# Patient Record
Sex: Male | Born: 1935 | Race: White | Hispanic: No | Marital: Married | Smoking: Former smoker
Health system: Southern US, Community
[De-identification: ages and names within clinical notes are randomized; demographics above are authoritative.]

## PROBLEM LIST (undated history)

## (undated) DIAGNOSIS — N184 Chronic kidney disease, stage 4 (severe): Secondary | ICD-10-CM

## (undated) DIAGNOSIS — Z860101 Personal history of adenomatous and serrated colon polyps: Secondary | ICD-10-CM

## (undated) DIAGNOSIS — Z86010 Personal history of colonic polyps: Secondary | ICD-10-CM

## (undated) DIAGNOSIS — Z9889 Other specified postprocedural states: Secondary | ICD-10-CM

## (undated) DIAGNOSIS — M549 Dorsalgia, unspecified: Secondary | ICD-10-CM

## (undated) DIAGNOSIS — N400 Enlarged prostate without lower urinary tract symptoms: Secondary | ICD-10-CM

## (undated) DIAGNOSIS — R0602 Shortness of breath: Secondary | ICD-10-CM

## (undated) DIAGNOSIS — J449 Chronic obstructive pulmonary disease, unspecified: Secondary | ICD-10-CM

## (undated) DIAGNOSIS — A0472 Enterocolitis due to Clostridium difficile, not specified as recurrent: Secondary | ICD-10-CM

## (undated) DIAGNOSIS — C3490 Malignant neoplasm of unspecified part of unspecified bronchus or lung: Secondary | ICD-10-CM

## (undated) HISTORY — PX: CHOLECYSTECTOMY: SHX55

## (undated) HISTORY — PX: APPENDECTOMY: SHX54

## (undated) HISTORY — PX: EYE SURGERY: SHX253

## (undated) HISTORY — DX: Malignant neoplasm of unspecified part of unspecified bronchus or lung: C34.90

## (undated) HISTORY — DX: Other specified postprocedural states: Z98.890

## (undated) HISTORY — PX: OTHER SURGICAL HISTORY: SHX169

## (undated) HISTORY — DX: Chronic kidney disease, stage 4 (severe): N18.4

## (undated) HISTORY — DX: Dorsalgia, unspecified: M54.9

## (undated) HISTORY — DX: Chronic obstructive pulmonary disease, unspecified: J44.9

## (undated) HISTORY — DX: Benign prostatic hyperplasia without lower urinary tract symptoms: N40.0

## (undated) HISTORY — DX: Personal history of adenomatous and serrated colon polyps: Z86.0101

## (undated) HISTORY — DX: Personal history of colonic polyps: Z86.010

---

## 1998-10-04 ENCOUNTER — Other Ambulatory Visit: Admission: RE | Admit: 1998-10-04 | Discharge: 1998-10-04 | Payer: Self-pay | Admitting: Urology

## 2002-07-27 ENCOUNTER — Emergency Department (HOSPITAL_COMMUNITY): Admission: EM | Admit: 2002-07-27 | Discharge: 2002-07-27 | Payer: Self-pay | Admitting: Emergency Medicine

## 2002-09-20 ENCOUNTER — Encounter: Payer: Self-pay | Admitting: Neurosurgery

## 2002-09-20 ENCOUNTER — Ambulatory Visit (HOSPITAL_COMMUNITY): Admission: RE | Admit: 2002-09-20 | Discharge: 2002-09-20 | Payer: Self-pay | Admitting: Neurosurgery

## 2002-10-05 ENCOUNTER — Encounter (HOSPITAL_COMMUNITY): Admission: RE | Admit: 2002-10-05 | Discharge: 2002-11-04 | Payer: Self-pay | Admitting: Neurosurgery

## 2003-02-01 ENCOUNTER — Encounter: Payer: Self-pay | Admitting: *Deleted

## 2003-02-01 ENCOUNTER — Emergency Department (HOSPITAL_COMMUNITY): Admission: EM | Admit: 2003-02-01 | Discharge: 2003-02-02 | Payer: Self-pay | Admitting: Emergency Medicine

## 2004-10-20 ENCOUNTER — Ambulatory Visit (HOSPITAL_COMMUNITY): Admission: RE | Admit: 2004-10-20 | Discharge: 2004-10-20 | Payer: Self-pay | Admitting: Neurosurgery

## 2005-11-30 ENCOUNTER — Ambulatory Visit: Payer: Self-pay | Admitting: Internal Medicine

## 2005-11-30 ENCOUNTER — Ambulatory Visit (HOSPITAL_COMMUNITY): Admission: RE | Admit: 2005-11-30 | Discharge: 2005-11-30 | Payer: Self-pay | Admitting: Internal Medicine

## 2005-12-15 ENCOUNTER — Ambulatory Visit (HOSPITAL_COMMUNITY): Admission: RE | Admit: 2005-12-15 | Discharge: 2005-12-15 | Payer: Self-pay | Admitting: Pulmonary Disease

## 2005-12-24 ENCOUNTER — Ambulatory Visit (HOSPITAL_COMMUNITY): Admission: RE | Admit: 2005-12-24 | Discharge: 2005-12-24 | Payer: Self-pay | Admitting: Pulmonary Disease

## 2006-01-01 ENCOUNTER — Ambulatory Visit (HOSPITAL_COMMUNITY): Admission: RE | Admit: 2006-01-01 | Discharge: 2006-01-01 | Payer: Self-pay | Admitting: Pulmonary Disease

## 2006-03-03 ENCOUNTER — Ambulatory Visit: Admission: RE | Admit: 2006-03-03 | Discharge: 2006-05-14 | Payer: Self-pay | Admitting: Radiation Oncology

## 2006-03-16 LAB — CBC WITH DIFFERENTIAL/PLATELET
EOS%: 2.9 % (ref 0.0–7.0)
Eosinophils Absolute: 0.2 10*3/uL (ref 0.0–0.5)
HCT: 39.2 % (ref 38.7–49.9)
MCH: 29.5 pg (ref 28.0–33.4)
MCV: 89.4 fL (ref 81.6–98.0)
MONO%: 5.9 % (ref 0.0–13.0)
NEUT#: 5.4 10*3/uL (ref 1.5–6.5)
NEUT%: 74.1 % (ref 40.0–75.0)
RBC: 4.39 10*6/uL (ref 4.20–5.71)
WBC: 7.3 10*3/uL (ref 4.0–10.0)

## 2006-04-20 LAB — CBC WITH DIFFERENTIAL/PLATELET
BASO%: 0.6 % (ref 0.0–2.0)
EOS%: 5 % (ref 0.0–7.0)
Eosinophils Absolute: 0.3 10*3/uL (ref 0.0–0.5)
HCT: 39.9 % (ref 38.7–49.9)
MCV: 90.7 fL (ref 81.6–98.0)
MONO#: 0.5 10*3/uL (ref 0.1–0.9)
MONO%: 7.4 % (ref 0.0–13.0)
NEUT%: 76.7 % — ABNORMAL HIGH (ref 40.0–75.0)
RBC: 4.4 10*6/uL (ref 4.20–5.71)
RDW: 18.4 % — ABNORMAL HIGH (ref 11.2–14.6)

## 2007-04-08 ENCOUNTER — Emergency Department (HOSPITAL_COMMUNITY): Admission: EM | Admit: 2007-04-08 | Discharge: 2007-04-08 | Payer: Self-pay | Admitting: Advanced Practice Midwife

## 2009-02-20 ENCOUNTER — Ambulatory Visit (HOSPITAL_COMMUNITY): Admission: RE | Admit: 2009-02-20 | Discharge: 2009-02-20 | Payer: Self-pay | Admitting: Pulmonary Disease

## 2009-04-18 ENCOUNTER — Ambulatory Visit (HOSPITAL_COMMUNITY): Admission: RE | Admit: 2009-04-18 | Discharge: 2009-04-18 | Payer: Self-pay | Admitting: Ophthalmology

## 2009-05-09 ENCOUNTER — Ambulatory Visit (HOSPITAL_COMMUNITY): Admission: RE | Admit: 2009-05-09 | Discharge: 2009-05-09 | Payer: Self-pay | Admitting: Unknown Physician Specialty

## 2010-12-28 LAB — BASIC METABOLIC PANEL
CO2: 28 mEq/L (ref 19–32)
Chloride: 110 mEq/L (ref 96–112)
Creatinine, Ser: 1.89 mg/dL — ABNORMAL HIGH (ref 0.4–1.5)
Sodium: 144 mEq/L (ref 135–145)

## 2010-12-28 LAB — HEMOGLOBIN AND HEMATOCRIT, BLOOD
HCT: 38.7 % — ABNORMAL LOW (ref 39.0–52.0)
Hemoglobin: 13.3 g/dL (ref 13.0–17.0)

## 2011-02-06 NOTE — Op Note (Signed)
NAME:  Paul Gray, Paul Gray                  ACCOUNT NO.:  [id number]   MEDICAL RECORD NO.:  [id number]          PATIENT TYPE:  AMB   LOCATION:  DAY                           FACILITY:  [facility]   PHYSICIAN:  R. Roetta Sessions, M.D. DATE OF BIRTH:  04-08-1923   DATE OF PROCEDURE:  11/30/2005  DATE OF DISCHARGE:                                 OPERATIVE REPORT   PROCEDURE:  Surveillance colonoscopy.   ENDOSCOPIST:  Gerrit Friends. Rourk, M.D.   INDICATIONS FOR PROCEDURE:  The patient is a 77 year old gentleman with a  history of colonic adenomatous polyps.  His last colonoscopy was in 2002.  He had only hyperplastic problems at that time.  He is having no lower GI  tract symptoms. Colonoscopy is now being done as surveillance maneuver. This  approach has been discussed with the patient at length.  The potential  risks, benefits, and alternatives have been reviewed; questions answered.  She is agreeable.  Please see the documentation in the medical record.   PROCEDURE NOTE:  O2 saturation, blood pressure, pulse and respirations were  monitored throughout the entire procedure.   CONSCIOUS SEDATION:  Versed 2 mg IV, Demerol 50 mg IV.   INSTRUMENT:  Olympus video chip system.   FINDINGS:  A digital rectal exam revealed no abnormalities.   ENDOSCOPIC FINDINGS:  The prep was good.   RECTUM:  Examination of the rectal mucosa including a retroflex view of the  anal verge revealed no abnormalities aside from internal hemorrhoids.   COLON:  The colonic mucosa was surveyed from the rectosigmoid junction  through the left transverse, right colon, to the area of the appendiceal  orifice, ileocecal valve, and cecum.  These structures were well seen and  photographed for the record.   From this level the scope was slowly withdrawn.  All previously mentioned  mucosal surfaces were again seen.  The patient was noted to have pancolonic  diverticula.  The remainder of the colonic mucosa appeared to be normal.  The patient tolerated the procedure well and was reacted in endoscopy.   IMPRESSION:  1.  Internal hemorrhoids, otherwise normal rectum.  2.  Pancolonic diverticulum.  3.  The remainder of the colonic mucosa appeared normal.   RECOMMENDATIONS:  1.  Diverticulosis literature provided to [name].  2.  If he remains in good health, would consider one more colonoscopy in 5      years.      [name], M.D.  Electronically Signed     RMR/MEDQ  D:  11/30/2005  T:  11/30/2005  Job:  [id number]   cc:   Ramon Dredge L. Juanetta Gosling, M.D.  Fax: [phone]

## 2011-02-19 ENCOUNTER — Encounter: Payer: Self-pay | Admitting: Gastroenterology

## 2011-02-19 ENCOUNTER — Ambulatory Visit (INDEPENDENT_AMBULATORY_CARE_PROVIDER_SITE_OTHER): Payer: Medicare Other | Admitting: Gastroenterology

## 2011-02-19 VITALS — BP 126/61 | HR 73 | Temp 97.2°F | Ht 69.0 in | Wt 176.6 lb

## 2011-02-19 DIAGNOSIS — Z860101 Personal history of adenomatous and serrated colon polyps: Secondary | ICD-10-CM

## 2011-02-19 DIAGNOSIS — Z86010 Personal history of colonic polyps: Secondary | ICD-10-CM

## 2011-02-19 NOTE — Patient Instructions (Signed)
We have set you up for a colonoscopy with Dr. Jena Gauss.  Further recommendations to follow.  No changes to your medications at this time.

## 2011-02-19 NOTE — Progress Notes (Signed)
Primary Care Physician:  Fredirick Maudlin, MD Primary Gastroenterologist:  Dr. Jena Gauss  Chief Complaint  Patient presents with  . Colon Cancer Screening    HPI:  Paul Gray is a 77 y.o. male here as a new patient for surveillance colonoscopy. Last colonoscopy in 2007 with internal hemorrhoids, pancolonic diverticula. He does have a hx of adenomatous polyps. According to the last colonoscopy in 2007, this may be the last surveillance for colon cancer needed. He is doing well at this time. No abdominal pain. No N/V. BM about every day. No blood in stool. No lack of appetite or wt loss. He has no concerns or questions at this time. Wife present.   Past Medical History  Diagnosis Date  . Lung cancer   . Back pain   . BPH (benign prostatic hyperplasia)   . S/P colonoscopy 2002, 2007    hyperplastic polyps 2002; pancolonic diverticula 2007  . Hx of adenomatous colonic polyps   . COPD (chronic obstructive pulmonary disease)     Past Surgical History  Procedure Date  . Hernia surgery x 3     inguinal hernia repair  . Cholecystectomy   . Appendectomy   . Radiofrequency ablation for lung cancer     Current Outpatient Prescriptions  Medication Sig Dispense Refill  . Fluticasone-Salmeterol (ADVAIR DISKUS) 250-50 MCG/DOSE AEPB Inhale 1 puff into the lungs every 12 (twelve) hours.        Marland Kitchen HYDROcodone-acetaminophen (VICODIN) 5-500 MG per tablet Take 1 tablet by mouth every 6 (six) hours as needed.        . Tamsulosin HCl (FLOMAX) 0.4 MG CAPS Take by mouth.        . tiotropium (SPIRIVA) 18 MCG inhalation capsule Place 18 mcg into inhaler and inhale daily.          Allergies as of 02/19/2011 - Review Complete 02/19/2011  Allergen Reaction Noted  . Penicillins  02/19/2011    No family hx of colorectal ca, digestive problems, liver problems.   History   Social History  . Marital Status: Married    Spouse Name: N/A    Number of Children: N/A  . Years of Education: N/A    Occupational History  . retired     cigarette factory   Social History Main Topics  . Smoking status: Former Games developer  . Smokeless tobacco: Not on file   Comment: quit 6 years ago, about 1 ppd X 60+ years  . Alcohol Use: No  . Drug Use: No   Review of Systems: Gen: Denies any fever, chills, sweats, anorexia, fatigue, weakness, malaise, weight loss, and sleep disorder CV: Denies chest pain, angina, palpitations, syncope, orthopnea, PND, peripheral edema, and claudication. Resp: Denies dyspnea at rest, dyspnea with exercise, cough, sputum, wheezing, coughing up blood, and pleurisy. GI: Denies vomiting blood, jaundice, and fecal incontinence.   Denies dysphagia or odynophagia. GU : Denies urinary burning, blood in urine, urinary frequency, urinary hesitancy, nocturnal urination, and urinary incontinence. MS: Denies joint pain, limitation of movement, and swelling, stiffness, low back pain, extremity pain. Denies muscle weakness, cramps, atrophy.  Derm: Denies rash, itching, dry skin, hives, moles, warts, or unhealing ulcers.  Psych: Denies depression, anxiety, memory loss, suicidal ideation, hallucinations, paranoia, and confusion. Heme: Denies bruising, bleeding, and enlarged lymph nodes.  Physical Exam: BP 126/61  Pulse 73  Temp(Src) 97.2 F (36.2 C) (Temporal)  Ht 5\' 9"  (1.753 m)  Wt 176 lb 9.6 oz (80.105 kg)  BMI 26.08 kg/m2 General:  Alert,  Well-developed, well-nourished, pleasant and cooperative in NAD Head:  Normocephalic and atraumatic. Eyes:  Sclera clear, no icterus.   Conjunctiva pink. Ears:  Normal auditory acuity. Nose:  No deformity, discharge,  or lesions. Mouth:  No deformity or lesions, dentition normal. Neck:  Supple; no masses or thyromegaly. Lungs:  Clear throughout to auscultation.   No wheezes, crackles, or rhonchi. No acute distress. Heart:  Regular rate and rhythm; no murmurs, clicks, rubs,  or gallops. Abdomen:  Soft, nontender and nondistended. No  masses, hepatosplenomegaly or hernias noted. Normal bowel sounds, without guarding, and without rebound.   Rectal:  Deferred until time of colonoscopy.   Msk:  Symmetrical without gross deformities. Normal posture. Pulses:  Normal pulses noted. Extremities:  Without clubbing or edema. Neurologic:  Alert and  oriented x4;  grossly normal neurologically. Skin:  Intact without significant lesions or rashes. Cervical Nodes:  No significant cervical adenopathy. Psych:  Alert and cooperative. Normal mood and affect.

## 2011-02-22 ENCOUNTER — Encounter: Payer: Self-pay | Admitting: Gastroenterology

## 2011-02-22 DIAGNOSIS — Z86010 Personal history of colonic polyps: Secondary | ICD-10-CM | POA: Insufficient documentation

## 2011-02-22 NOTE — Assessment & Plan Note (Signed)
Paul Gray is an 77 year old Caucasian male with a hx of adenomatous polyps in remote past, now presenting for updated surveillance colonoscopy. Last colonoscopy in 2007 with internal hemorrhoids and pancolonic diverticula. He is doing well without any rectal bleeding, abdominal pain, N/V, or wt loss. We will proceed with a surveillance colonoscopy due to his hx of adenomatous polyps.  Proceed with TCS with Dr. Jena Gauss in near future: the risks, benefits, and alternatives have been discussed with the patient in detail. The patient states understanding and desires to proceed.

## 2011-02-23 NOTE — Progress Notes (Signed)
Cc to PCP 

## 2011-03-04 ENCOUNTER — Ambulatory Visit (HOSPITAL_COMMUNITY)
Admission: RE | Admit: 2011-03-04 | Discharge: 2011-03-04 | Disposition: A | Discharge status: Home / Self Care | Payer: Medicare Other | Source: Ambulatory Visit | Attending: Internal Medicine | Admitting: Internal Medicine

## 2011-03-04 ENCOUNTER — Encounter: Payer: Medicare Other | Admitting: Internal Medicine

## 2011-03-04 DIAGNOSIS — K5730 Diverticulosis of large intestine without perforation or abscess without bleeding: Secondary | ICD-10-CM

## 2011-03-04 DIAGNOSIS — Z1211 Encounter for screening for malignant neoplasm of colon: Secondary | ICD-10-CM

## 2011-03-04 DIAGNOSIS — Z860100 Personal history of colon polyps, unspecified: Secondary | ICD-10-CM | POA: Insufficient documentation

## 2011-03-04 DIAGNOSIS — Z09 Encounter for follow-up examination after completed treatment for conditions other than malignant neoplasm: Secondary | ICD-10-CM | POA: Insufficient documentation

## 2011-03-04 DIAGNOSIS — Z85118 Personal history of other malignant neoplasm of bronchus and lung: Secondary | ICD-10-CM | POA: Insufficient documentation

## 2011-04-06 NOTE — Op Note (Signed)
  NAME:  Sica, Thane                  ACCOUNT NO.:  [id number]  MEDICAL RECORD NO.:  [id number]  LOCATION:  DAYP                          FACILITY:  [facility]  PHYSICIAN:  R. Roetta Sessions, MD FACP FACGDATE OF BIRTH:  1923/08/20  DATE OF PROCEDURE:  03/04/2011 DATE OF DISCHARGE:                              OPERATIVE REPORT   INDICATIONS FOR PROCEDURE:  An 77 year old gentleman with a history of colonic adenomas.  He has no lower GI tract symptoms.  Last colonoscopy is 2007.  Colonoscopy is now being done as a surveillance maneuver. Risks, benefits, limitations, alternatives and imponderables have been discussed, questions answered.  Please see the documentation in the medical record.  PROCEDURE NOTE:  O2 saturation, blood pressure, pulse and respirations were monitored throughout the entirety of the procedure.  CONSCIOUS SEDATION: 1. Versed 3 mg IV. 2. Demerol 50 mg IV in divided doses.  INSTRUMENT:  Pentax video chip system.  FINDINGS:  Digital rectal exam revealed no abnormalities.  Endoscopic findings:  Prep was adequate.  Colon:  Colonic mucosa was surveyed from the rectosigmoid junction through the left transverse right colon to the appendiceal orifice, ileocecal valve/cecum.  These structures were well seen and photographed for the record.  From this level, the scope was slowly and cautiously withdrawn.  All previously mentioned mucosal surfaces were again seen.  The patient was noted to have scattered pancolonic diverticula.  However, the remainder of the colonic mucosa appeared normal.  The scope was pulled down into the rectum, where a thorough examination of the rectal mucosa including retroflexed view of the anal verge demonstrated no abnormalities.  The patient tolerated the procedure well.  Cecal withdrawal time 8 minutes.  IMPRESSION: 1. Normal rectum. 2. Pancolonic diverticula. 3. Colonic mucosa appeared normal.  RECOMMENDATIONS: 1. Diverticulosis literature  provided to Mr. Chiappetta. 2. I would not necessarily recommend that Mr. Sedano needs any further     colorectal cancer screening/surveillance examinations at this time. 3. He is urged to followup with by Dr. Juanetta Gosling as scheduled.     [name], MD FACP [facility]     RMR/MEDQ  D:  03/04/2011  T:  03/04/2011  Job:  [id number]  cc:   Ramon Dredge L. Juanetta Gosling, M.D. Fax: [phone]  Electronically Signed by Lorrin Goodell M.D. on 04/06/2011 08:40:59 AM

## 2011-04-10 ENCOUNTER — Other Ambulatory Visit (HOSPITAL_COMMUNITY): Payer: Self-pay | Admitting: Pulmonary Disease

## 2011-04-10 ENCOUNTER — Ambulatory Visit (HOSPITAL_COMMUNITY)
Admission: RE | Admit: 2011-04-10 | Discharge: 2011-04-10 | Disposition: A | Discharge status: Home / Self Care | Payer: Medicare Other | Source: Ambulatory Visit | Attending: Pulmonary Disease | Admitting: Pulmonary Disease

## 2011-04-10 DIAGNOSIS — M899 Disorder of bone, unspecified: Secondary | ICD-10-CM | POA: Insufficient documentation

## 2011-04-10 DIAGNOSIS — M949 Disorder of cartilage, unspecified: Secondary | ICD-10-CM | POA: Insufficient documentation

## 2011-04-10 DIAGNOSIS — M545 Low back pain, unspecified: Secondary | ICD-10-CM | POA: Insufficient documentation

## 2011-04-10 DIAGNOSIS — M51379 Other intervertebral disc degeneration, lumbosacral region without mention of lumbar back pain or lower extremity pain: Secondary | ICD-10-CM | POA: Insufficient documentation

## 2011-04-10 DIAGNOSIS — M5137 Other intervertebral disc degeneration, lumbosacral region: Secondary | ICD-10-CM | POA: Insufficient documentation

## 2011-10-10 ENCOUNTER — Emergency Department (HOSPITAL_COMMUNITY): Payer: Medicare Other

## 2011-10-10 ENCOUNTER — Encounter (HOSPITAL_COMMUNITY): Payer: Self-pay

## 2011-10-10 ENCOUNTER — Other Ambulatory Visit: Payer: Self-pay

## 2011-10-10 ENCOUNTER — Inpatient Hospital Stay (HOSPITAL_COMMUNITY)
Admission: EM | Admit: 2011-10-10 | Discharge: 2011-10-16 | DRG: 190 | Disposition: A | Discharge status: Home Health | Payer: Medicare Other | Attending: Pulmonary Disease | Admitting: Pulmonary Disease

## 2011-10-10 DIAGNOSIS — J441 Chronic obstructive pulmonary disease with (acute) exacerbation: Principal | ICD-10-CM | POA: Diagnosis present

## 2011-10-10 DIAGNOSIS — Z87891 Personal history of nicotine dependence: Secondary | ICD-10-CM

## 2011-10-10 DIAGNOSIS — Z85118 Personal history of other malignant neoplasm of bronchus and lung: Secondary | ICD-10-CM

## 2011-10-10 DIAGNOSIS — Z79899 Other long term (current) drug therapy: Secondary | ICD-10-CM

## 2011-10-10 DIAGNOSIS — N400 Enlarged prostate without lower urinary tract symptoms: Secondary | ICD-10-CM | POA: Diagnosis present

## 2011-10-10 DIAGNOSIS — N184 Chronic kidney disease, stage 4 (severe): Secondary | ICD-10-CM | POA: Diagnosis present

## 2011-10-10 DIAGNOSIS — J40 Bronchitis, not specified as acute or chronic: Secondary | ICD-10-CM

## 2011-10-10 DIAGNOSIS — F05 Delirium due to known physiological condition: Secondary | ICD-10-CM | POA: Diagnosis present

## 2011-10-10 DIAGNOSIS — N189 Chronic kidney disease, unspecified: Secondary | ICD-10-CM | POA: Diagnosis present

## 2011-10-10 DIAGNOSIS — N179 Acute kidney failure, unspecified: Secondary | ICD-10-CM | POA: Diagnosis present

## 2011-10-10 DIAGNOSIS — E860 Dehydration: Secondary | ICD-10-CM | POA: Diagnosis present

## 2011-10-10 DIAGNOSIS — J449 Chronic obstructive pulmonary disease, unspecified: Secondary | ICD-10-CM | POA: Diagnosis present

## 2011-10-10 DIAGNOSIS — R0902 Hypoxemia: Secondary | ICD-10-CM | POA: Diagnosis present

## 2011-10-10 DIAGNOSIS — N390 Urinary tract infection, site not specified: Secondary | ICD-10-CM | POA: Diagnosis present

## 2011-10-10 DIAGNOSIS — J189 Pneumonia, unspecified organism: Secondary | ICD-10-CM | POA: Diagnosis present

## 2011-10-10 DIAGNOSIS — E119 Type 2 diabetes mellitus without complications: Secondary | ICD-10-CM | POA: Diagnosis present

## 2011-10-10 LAB — DIFFERENTIAL
Basophils Absolute: 0 10*3/uL (ref 0.0–0.1)
Basophils Relative: 0 % (ref 0–1)
Eosinophils Absolute: 0 10*3/uL (ref 0.0–0.7)
Lymphocytes Relative: 7 % — ABNORMAL LOW (ref 12–46)
Lymphs Abs: 0.7 10*3/uL (ref 0.7–4.0)
Monocytes Absolute: 0.9 10*3/uL (ref 0.1–1.0)
Monocytes Relative: 9 % (ref 3–12)
Neutro Abs: 8.6 10*3/uL — ABNORMAL HIGH (ref 1.7–7.7)
Neutrophils Relative %: 84 % — ABNORMAL HIGH (ref 43–77)

## 2011-10-10 LAB — CBC
MCHC: 32.3 g/dL (ref 30.0–36.0)
RDW: 14.3 % (ref 11.5–15.5)

## 2011-10-10 LAB — BASIC METABOLIC PANEL
BUN: 52 mg/dL — ABNORMAL HIGH (ref 6–23)
CO2: 22 mEq/L (ref 19–32)
GFR calc Af Amer: 26 mL/min — ABNORMAL LOW (ref >90)
Glucose, Bld: 142 mg/dL — ABNORMAL HIGH (ref 70–99)

## 2011-10-10 LAB — URINALYSIS, ROUTINE W REFLEX MICROSCOPIC
Glucose, UA: NEGATIVE mg/dL
Ketones, ur: NEGATIVE mg/dL
Leukocytes, UA: NEGATIVE
Nitrite: NEGATIVE
Urobilinogen, UA: 0.2 mg/dL (ref 0.0–1.0)

## 2011-10-10 LAB — URINE MICROSCOPIC-ADD ON

## 2011-10-10 MED ORDER — BIOTENE DRY MOUTH MT LIQD
15.0000 mL | Freq: Two times a day (BID) | OROMUCOSAL | Status: DC
  Administered 2011-10-10 – 2011-10-16 (×12): 15 mL via OROMUCOSAL

## 2011-10-10 MED ORDER — IPRATROPIUM BROMIDE 0.02 % IN SOLN
0.5000 mg | RESPIRATORY_TRACT | Status: DC
  Administered 2011-10-10 – 2011-10-16 (×27): 0.5 mg via RESPIRATORY_TRACT
  Filled 2011-10-10 (×27): qty 2.5

## 2011-10-10 MED ORDER — ALBUTEROL SULFATE (5 MG/ML) 0.5% IN NEBU
2.5000 mg | INHALATION_SOLUTION | RESPIRATORY_TRACT | Status: DC
  Administered 2011-10-10 – 2011-10-16 (×27): 2.5 mg via RESPIRATORY_TRACT
  Filled 2011-10-10 (×27): qty 0.5

## 2011-10-10 MED ORDER — ALBUTEROL SULFATE (5 MG/ML) 0.5% IN NEBU
5.0000 mg | INHALATION_SOLUTION | Freq: Once | RESPIRATORY_TRACT | Status: AC
  Administered 2011-10-10: 5 mg via RESPIRATORY_TRACT
  Filled 2011-10-10: qty 1

## 2011-10-10 MED ORDER — ENOXAPARIN SODIUM 40 MG/0.4ML ~~LOC~~ SOLN
40.0000 mg | SUBCUTANEOUS | Status: DC
  Administered 2011-10-10: 40 mg via SUBCUTANEOUS
  Filled 2011-10-10: qty 0.4

## 2011-10-10 MED ORDER — SODIUM CHLORIDE 0.9 % IV BOLUS (SEPSIS)
500.0000 mL | Freq: Once | INTRAVENOUS | Status: AC
  Administered 2011-10-10: 14:00:00 via INTRAVENOUS

## 2011-10-10 MED ORDER — SODIUM CHLORIDE 0.9 % IV BOLUS (SEPSIS)
500.0000 mL | Freq: Once | INTRAVENOUS | Status: AC
  Administered 2011-10-10: 500 mL via INTRAVENOUS

## 2011-10-10 MED ORDER — CIPROFLOXACIN IN D5W 400 MG/200ML IV SOLN
400.0000 mg | Freq: Two times a day (BID) | INTRAVENOUS | Status: DC
  Administered 2011-10-11: 400 mg via INTRAVENOUS
  Filled 2011-10-10 (×4): qty 200

## 2011-10-10 MED ORDER — IPRATROPIUM BROMIDE 0.02 % IN SOLN
0.5000 mg | Freq: Once | RESPIRATORY_TRACT | Status: AC
  Administered 2011-10-10: 0.5 mg via RESPIRATORY_TRACT
  Filled 2011-10-10: qty 2.5

## 2011-10-10 MED ORDER — METHYLPREDNISOLONE SODIUM SUCC 125 MG IJ SOLR
125.0000 mg | Freq: Once | INTRAMUSCULAR | Status: AC
  Administered 2011-10-10: 125 mg via INTRAVENOUS
  Filled 2011-10-10: qty 2

## 2011-10-10 MED ORDER — CIPROFLOXACIN IN D5W 400 MG/200ML IV SOLN
400.0000 mg | Freq: Once | INTRAVENOUS | Status: AC
  Administered 2011-10-10: 400 mg via INTRAVENOUS
  Filled 2011-10-10: qty 200

## 2011-10-10 MED ORDER — TAMSULOSIN HCL 0.4 MG PO CAPS
0.4000 mg | ORAL_CAPSULE | Freq: Every day | ORAL | Status: DC
  Administered 2011-10-11 – 2011-10-16 (×6): 0.4 mg via ORAL
  Filled 2011-10-10 (×6): qty 1

## 2011-10-10 MED ORDER — SODIUM CHLORIDE 0.9 % IV SOLN: INTRAVENOUS | Status: DC

## 2011-10-10 MED ORDER — SODIUM CHLORIDE 0.9 % IV SOLN
INTRAVENOUS | Status: DC
  Administered 2011-10-10: 950 mL via INTRAVENOUS
  Administered 2011-10-10 – 2011-10-14 (×3): via INTRAVENOUS

## 2011-10-10 MED ORDER — HYDROCODONE-ACETAMINOPHEN 5-325 MG PO TABS: 1.0000 | ORAL_TABLET | Freq: Four times a day (QID) | ORAL | Status: DC | PRN

## 2011-10-10 MED ORDER — DEXTROSE 5 % IV SOLN
1.0000 g | Freq: Once | INTRAVENOUS | Status: DC
  Filled 2011-10-10: qty 10

## 2011-10-10 MED ORDER — PREGABALIN 50 MG PO CAPS
50.0000 mg | ORAL_CAPSULE | Freq: Every day | ORAL | Status: DC
  Administered 2011-10-10 – 2011-10-15 (×6): 50 mg via ORAL
  Filled 2011-10-10 (×6): qty 1

## 2011-10-10 NOTE — ED Notes (Signed)
Cardiac monitor showing ST with rate of 121. Pt states that he is feeling better already. Pt alert and oriented and watching TV.

## 2011-10-10 NOTE — ED Provider Notes (Signed)
History  Scribed for Paul Hams, MD, the patient was seen in room APA01/APA01. This chart was scribed by Candelaria Stagers. The patient's care started at 12:11 PM    CSN: [id number]  Arrival date & time 10/10/11  1201   None     Chief Complaint  Patient presents with  . Cough  . Shortness of Breath     The history is provided by the patient.   Paul Gray is a 78 y.o. male who presents to the Emergency Department complaining of SOB that has progressively gotten worse over the last three days.  Pt has a h/o COPD.  He is experiencing a cough, loss of appetite, trouble sleeping, and weakness while walking.  His wife reports that he has not eaten today stating that he was not hungry.  Pt denies fever.  He does not use O2 at home.  He used advair and Spiriva this morning with no relief.  His PCP is Dr. Juanetta Gosling.      Past Medical History  Diagnosis Date  . Lung cancer   . Back pain   . BPH (benign prostatic hyperplasia)   . S/P colonoscopy 2002, 2007    hyperplastic polyps 2002; pancolonic diverticula 2007  . Hx of adenomatous colonic polyps   . COPD (chronic obstructive pulmonary disease)     Past Surgical History  Procedure Date  . Hernia surgery x 3     inguinal hernia repair  . Cholecystectomy   . Appendectomy   . Radiofrequency ablation for lung cancer     No family history on file.  History  Substance Use Topics  . Smoking status: Former Games developer  . Smokeless tobacco: Not on file   Comment: quit 6 years ago, about 1 ppd X 60+ years  . Alcohol Use: No      Review of Systems  Constitutional: Positive for appetite change.  Respiratory: Positive for cough and shortness of breath.   Cardiovascular: Negative for leg swelling.  Gastrointestinal: Positive for abdominal pain.  Musculoskeletal: Positive for gait problem.  Neurological: Positive for weakness.  Psychiatric/Behavioral: Positive for sleep disturbance.  All other systems reviewed and are  negative.    Allergies  Penicillins  Home Medications   Current Outpatient Rx  Name Route Sig Dispense Refill  . FLUTICASONE-SALMETEROL 250-50 MCG/DOSE IN AEPB Inhalation Inhale 1 puff into the lungs every 12 (twelve) hours.     Marland Kitchen HYDROCODONE-ACETAMINOPHEN 5-500 MG PO TABS Oral Take 1 tablet by mouth every 6 (six) hours as needed. For back pain    . PREGABALIN 50 MG PO CAPS Oral Take 50 mg by mouth every evening.    Marland Kitchen TAMSULOSIN HCL 0.4 MG PO CAPS Oral Take 0.4 mg by mouth daily.     Marland Kitchen TIOTROPIUM BROMIDE MONOHYDRATE 18 MCG IN CAPS Inhalation Place 18 mcg into inhaler and inhale daily.       BP 142/81  Pulse 144  Temp(Src) 98.7 F (37.1 C) (Oral)  Resp 26  Ht 5\' 9"  (1.753 m)  Wt 170 lb (77.111 kg)  BMI 25.10 kg/m2  SpO2 80%  Physical Exam  Nursing note and vitals reviewed. Constitutional: He is oriented to person, place, and time. He appears well-developed and well-nourished.  HENT:  Head: Normocephalic and atraumatic.  Eyes: EOM are normal. Right eye exhibits no discharge. Left eye exhibits no discharge.  Neck: Normal range of motion. Neck supple. No JVD present.  Cardiovascular:  No murmur heard.      Tachycardic.  Pulmonary/Chest: He has no wheezes.       Poor air movement.   Abdominal: He exhibits no mass. There is tenderness (mild).  Musculoskeletal: Normal range of motion. He exhibits no edema.  Neurological: He is alert and oriented to person, place, and time. No cranial nerve deficit. He exhibits normal muscle tone.  Skin: Skin is warm and dry.  Psychiatric: He has a normal mood and affect. His behavior is normal.    ED Course  Procedures      DIAGNOSTIC STUDIES: Oxygen Saturation is 80% on room air, normal by my interpretation.    Date: 10/10/2011  Rate: 126  Rhythm: sinus tachycardia  QRS Axis: left  Intervals: normal  ST/T Wave abnormalities: normal  Conduction Disutrbances:none  Narrative Interpretation:   Old EKG Reviewed: none  available  COORDINATION OF CARE:  12:21PM Ordered: methylPREDNISolone sodium succinate (SOLU-MEDROL) 125 MG injection 125 mg; EKG 12-LEAD; DG CHEST PORT 1 VIEW; Basic metabolic panel ; CBC ; Differential ; Urinalysis, Routine w reflex microscopic ; DG Chest Port 1 View ; EKG 12-Lead ; Cardiac monitoring ; albuterol (PROVENTIL) (5 MG/ML) 0.5% nebulizer solution 5 mg ; ipratropium (ATROVENT) nebulizer solution 0.5 mg  12:22 PM O2 is now 94% on 6L room air.  2:21PM Ordered: 0.9 % sodium chloride infusion ; sodium chloride 0.9 % bolus 500 mL   Labs Reviewed  BASIC METABOLIC PANEL - Abnormal; Notable for the following:    Glucose, Bld 142 (*)    BUN 52 (*)    Creatinine, Ser 2.41 (*)    GFR calc non Af Amer 23 (*)    GFR calc Af Amer 26 (*)    All other components within normal limits  CBC - Abnormal; Notable for the following:    RBC 4.20 (*)    All other components within normal limits  DIFFERENTIAL - Abnormal; Notable for the following:    Neutrophils Relative 84 (*)    Neutro Abs 8.6 (*)    Lymphocytes Relative 7 (*)    All other components within normal limits  URINALYSIS, ROUTINE W REFLEX MICROSCOPIC - Abnormal; Notable for the following:    APPearance HAZY (*)    Hgb urine dipstick TRACE (*)    Bilirubin Urine SMALL (*)    Protein, ur 30 (*)    All other components within normal limits  URINE MICROSCOPIC-ADD ON - Abnormal; Notable for the following:    Bacteria, UA MANY (*)    Casts GRANULAR CAST (*)    All other components within normal limits   Dg Chest Port 1 View  10/10/2011  *RADIOLOGY REPORT*  Clinical Data: Cough, congestion, wheeze, shortness of breath  PORTABLE CHEST - 1 VIEW  Comparison: 02/20/2009  Findings: Bilateral lower lobe scarring versus atelectasis. Increasing left upper lobe opacity in area of prior scarring/postsurgical changes.   Underlying chronic interstitial markings/emphysematous changes. No pleural effusion or pneumothorax.  Cardiomediastinal  silhouette is within normal limits.  IMPRESSION: No evidence of acute cardiopulmonary disease. Chronic interstitial markings with lower lobe scarring/atelectasis.  Increasing left upper lobe opacity in the area of prior scarring/postsurgical changes. Nonemergent CT chest with contrast is suggested for further evaluation.  Original Report Authenticated By: Charline Bills, M.D.     1. Bronchitis   2. UTI (lower urinary tract infection)   3. Dehydration       MDM  Exacerbation COPD with bronchitis and hypoxia. Incidental UTI. Patient presents with elevated creatinine from baseline; consistent with moderate dehydration. Patient needs to be  admitted for stabilization   I personally performed the services described in this documentation, which was scribed in my presence. The recorded information has been reviewed and considered.          Flint Melter, MD 10/10/11 1535

## 2011-10-10 NOTE — H&P (Signed)
NAME:  Paul Gray, Paul Gray                  ACCOUNT NO.:  [id number]  MEDICAL RECORD NO.:  [id number]  LOCATION:  A332                          FACILITY:  [facility]  PHYSICIAN:  Purcell Nails, MD DATE OF BIRTH:  06-23-1923  DATE OF ADMISSION:  10/10/2011 DATE OF DISCHARGE:  LH                             HISTORY & PHYSICAL   CHIEF COMPLAINT:  Cough and shortness of breath.  HISTORY OF PRESENT ILLNESS:  This is a 78 year old gentleman with multiple medical problems including advanced COPD, history of lung cancer, status post surgery 5 years ago.  He presents to the emergency department complaining of shortness of breath, which progressively gotten worse over the last 3 days associated with cough, loss of appetite, and generalized weakness.  He also had loss of appetite.  He denied fever.  He uses his inhalers and did not get the usual relief today.  He is a former smoker but no recent smoking.  He does not use oxygen at home.  Denied any chest pain.  On emergency room workup, he was found to have urinary tract infection and acute on chronic renal failure.  He is admitted with a diagnosis of COPD exacerbation, acute renal failure, and urinary tract infection.  PAST MEDICAL HISTORY:  COPD, lung cancer, BPH, hyperplastic adenomatous colonic polyps, back pain.  PAST SURGICAL HISTORY:  Cholecystectomy, appendectomy, radiofrequency ablation of lung cancer and hernia surgery x3.  FAMILY HISTORY:  None remarkable.  SOCIAL HISTORY:  Former smoker.  No current alcohol, drug, or smoking use.  REVIEW OF SYSTEMS:  As in HPI, including loss of appetite, cough with shortness of breath.  No chest pain.  No nausea, no vomiting.  All others have been reviewed and negative.  ALLERGIES:  He is allergic to penicillin.  HOME MEDICATIONS:  Include fluticasone, hydrocodone, pregabalin, tamsulosin, and tiotropium bromide.  PHYSICAL EXAMINATION:  GENERAL:  He is alert and oriented x3.  Blood pressure  127/77, pulse rate 97, temperature 98.8, breathing rate 20. HEENT:  Slightly dry mucous membranes. NECK:  Negative for JVD or thyromegaly. CHEST:  Significant for poor bilateral basal air entry. CARDIOVASCULAR:  Distant heart sounds.  No murmur. ABDOMEN:  Soft and nontender.  Bowel sounds present. EXTREMITIES:  No edema. CNS:  Nonfocal. SKIN:  Has no rash, no hyperemia.  LAB WORK:  Sodium 143, potassium 4.6, chloride 107, bicarb 22, BUN 52, creatinine 2.41.  This is an increase from 1.89 from prior determination.  Calcium 9.9.  WBC 10.2, hemoglobin 13.0, platelet count 175,000.  His urinalysis showed many bacteria, however, negative for nitrites.  His chest x-ray today showed no evidence of acute cardiopulmonary disease, however, chronic interstitial markings with lower lobe scarring and atelectasis.  ASSESSMENT: 1. Chronic obstructive pulmonary disease exacerbation. 2. Urinary tract infection. 3. Dehydration. 4. Acute on chronic renal failure. 5. PENICILLIN allergy.  PLAN:  We will admit to treat with bronchodilators every 4 hours.  We will cover with ciprofloxacin 400 mg IV b.i.d.  We will hydrate with normal saline with appropriate bolus.  We will provide DVT prophylaxis. We will continue his home medications as appropriate.  We will provide oxygen supplement per nasal cannula to keep  saturation above 90%.                                           ______________________________ Purcell Nails, MD     GN/MEDQ  D:  10/10/2011  T:  10/10/2011  Job:  [id number]

## 2011-10-10 NOTE — ED Notes (Signed)
Pt being admitted to room 332 via stretcher.

## 2011-10-10 NOTE — ED Notes (Signed)
Cardiac monitor showing NSR. No complaints at present. IV infusing with no edema or redness.

## 2011-10-10 NOTE — ED Notes (Signed)
Pt presents with cough and SOB since Thursday. Pt appears SOB and anxious in triage.

## 2011-10-10 NOTE — H&P (Signed)
  [id number] 

## 2011-10-11 LAB — COMPREHENSIVE METABOLIC PANEL
AST: 20 U/L (ref 0–37)
Albumin: 2.7 g/dL — ABNORMAL LOW (ref 3.5–5.2)
Calcium: 9.4 mg/dL (ref 8.4–10.5)
Creatinine, Ser: 2.04 mg/dL — ABNORMAL HIGH (ref 0.50–1.35)
GFR calc non Af Amer: 28 mL/min — ABNORMAL LOW (ref >90)

## 2011-10-11 LAB — BLOOD GAS, ARTERIAL
Bicarbonate: 20.8 mEq/L (ref 20.0–24.0)
Patient temperature: 37
pH, Arterial: 7.355 (ref 7.350–7.450)
pO2, Arterial: 64.8 mmHg — ABNORMAL LOW (ref 80.0–100.0)

## 2011-10-11 MED ORDER — ALPRAZOLAM 0.5 MG PO TABS
0.5000 mg | ORAL_TABLET | Freq: Once | ORAL | Status: AC
  Administered 2011-10-11: 0.5 mg via ORAL
  Filled 2011-10-11: qty 1

## 2011-10-11 MED ORDER — SODIUM CHLORIDE 0.9 % IJ SOLN
INTRAMUSCULAR | Status: AC
  Filled 2011-10-11: qty 3

## 2011-10-11 MED ORDER — METHYLPREDNISOLONE SODIUM SUCC 125 MG IJ SOLR
125.0000 mg | Freq: Four times a day (QID) | INTRAMUSCULAR | Status: DC
  Administered 2011-10-11 – 2011-10-13 (×8): 125 mg via INTRAVENOUS
  Filled 2011-10-11 (×8): qty 2

## 2011-10-11 MED ORDER — ENOXAPARIN SODIUM 30 MG/0.3ML ~~LOC~~ SOLN
30.0000 mg | SUBCUTANEOUS | Status: DC
  Administered 2011-10-11 – 2011-10-15 (×5): 30 mg via SUBCUTANEOUS
  Filled 2011-10-11 (×6): qty 0.3

## 2011-10-11 MED ORDER — CIPROFLOXACIN IN D5W 400 MG/200ML IV SOLN
400.0000 mg | INTRAVENOUS | Status: DC
  Administered 2011-10-12: 400 mg via INTRAVENOUS
  Filled 2011-10-11: qty 200

## 2011-10-11 NOTE — Progress Notes (Signed)
Writer heard chair alarm sounding and pt was standing up and had pulled out IV, had blood every where.  Had tangled telemetry up with O2 tubing and pt has been very confused.  Stated to Clinical research associate that there were 3 goats on roof outside window.  Pt very anxious and agitated.  Writer reassured pt and pt calmed down and sat down.  Pt very unsteady on feet.  Pt stating that he has to go to [place], Clinical research associate reassured pt that he was in [place].  Writer called Dr. Fransico Him on call for Juanetta Gosling to make aware and received further orders and followed.  Will monitor need for safety sitter.

## 2011-10-11 NOTE — Progress Notes (Signed)
Pharmacy - Note  Estimated Creatinine Clearance: 26 ml/min (by C-G formula based on Cr of 2.04).  Patient's Lovenox and Cipro doses have been adjusted for renal function.  Will follow renal function and increase doses if renal function improves.  Mady Gemma 10/11/2011 9:26 AM

## 2011-10-11 NOTE — Progress Notes (Signed)
NAME:  Paul Gray, [name]                  ACCOUNT NO.:  [id number]  MEDICAL RECORD NO.:  [id number]  LOCATION:  A332                          FACILITY:  [facility]  PHYSICIAN:  Caeson Filippi D. Felecia Shelling, MD   DATE OF BIRTH:  19-Jul-1923  DATE OF PROCEDURE:  10/11/2011 DATE OF DISCHARGE:                                PROGRESS NOTE   SUBJECTIVE:  The patient was admitted yesterday due to shortness of breath.  He is on nebulizer treatment and IV antibiotics.  The patient is still complaining of shortness of breath. He gets tachycardic.  OBJECTIVE:  GENERAL:  The patient is acutely sick looking. VITAL SIGNS:  Blood pressure 113/82, pulse 84, respiratory rate 20, temperature 97 degrees Fahrenheit. CHEST:  Poor air entry, bilateral expiratory wheezes and rhonchi. CARDIOVASCULAR:  First and second heart sounds heard.  No murmur.  No gallop. ABDOMEN:  Soft and lax.  Bowel sound is positive.  No mass or organomegaly. EXTREMITIES:  No leg edema.  ASSESSMENT: 1. Acute exacerbation of chronic obstructive pulmonary disease. 2. Probably superimposed pneumonia. 3. Hypoxemia secondary to the above. 4. Chronic renal failure. 5. Diabetes mellitus.  PLAN:  We will start the patient on IV steroid.  Continue nebulizer treatment.  Continue IV antibiotics.     Paul Gray D. Felecia Shelling, MD     TDF/MEDQ  D:  10/11/2011  T:  10/11/2011  Job:  [id number]

## 2011-10-12 LAB — BASIC METABOLIC PANEL
GFR calc Af Amer: 34 mL/min — ABNORMAL LOW (ref >90)
GFR calc non Af Amer: 29 mL/min — ABNORMAL LOW (ref >90)
Glucose, Bld: 212 mg/dL — ABNORMAL HIGH (ref 70–99)
Potassium: 4 mEq/L (ref 3.5–5.1)
Sodium: 147 mEq/L — ABNORMAL HIGH (ref 135–145)

## 2011-10-12 LAB — CBC
Hemoglobin: 11.5 g/dL — ABNORMAL LOW (ref 13.0–17.0)
MCHC: 32.8 g/dL (ref 30.0–36.0)
Platelets: 196 10*3/uL (ref 150–400)

## 2011-10-12 MED ORDER — LEVOFLOXACIN IN D5W 500 MG/100ML IV SOLN
500.0000 mg | INTRAVENOUS | Status: DC
  Administered 2011-10-12 – 2011-10-16 (×5): 500 mg via INTRAVENOUS
  Filled 2011-10-12 (×5): qty 100

## 2011-10-12 MED ORDER — ONDANSETRON HCL 4 MG/2ML IJ SOLN
4.0000 mg | Freq: Four times a day (QID) | INTRAMUSCULAR | Status: DC | PRN
  Administered 2011-10-12: 4 mg via INTRAVENOUS
  Filled 2011-10-12: qty 2

## 2011-10-12 NOTE — Progress Notes (Signed)
Inpatient Diabetes Program Recommendations  AACE/ADA: New Consensus Statement on Inpatient Glycemic Control (2009)  Target Ranges:  Prepandial:   less than 140 mg/dL      Peak postprandial:   less than 180 mg/dL (1-2 hours)      Critically ill patients:  140 - 180 mg/dL   Reason for Visit: Steroid-induced hyperglycemia  Inpatient Diabetes Program Recommendations Correction (SSI): Add Novolog Correction TID and HS

## 2011-10-12 NOTE — Progress Notes (Signed)
CSW received referral for possible placement. CM is trying to get in touch with wife regarding d/c plan as he is from home.  No PT ordered yet.  CSW will sign off but can be reconsulted if SNF is recommended prior to d/c.    Karn Cassis

## 2011-10-12 NOTE — Progress Notes (Signed)
CARE MANAGEMENT NOTE 10/12/2011  Patient:  Paul Gray, Paul Gray   Account Number:  [id number]  Date Initiated:  10/12/2011  Documentation initiated by:  Rosemary Holms  Subjective/Objective Assessment:   Pt admitted from home with COPD. Lived with wife. Per pt used [facility] in the past     Action/Plan:   Since pt was not completely clear regarding DC plans, left 2 messages at home for wife to return call. CM to follow.   Anticipated DC Date:  10/15/2011   Anticipated DC Plan:  HOME W HOME HEALTH SERVICES      DC Planning Services  CM consult      Choice offered to / List presented to:             Status of service:  In process, will continue to follow Medicare Important Message given?   (If response is "NO", the following Medicare IM given date fields will be blank) Date Medicare IM given:   Date Additional Medicare IM given:    Discharge Disposition:    Per UR Regulation:    Comments:  10/11/11 1500 Obert Espindola Leanord Hawking RN BSN

## 2011-10-12 NOTE — Progress Notes (Signed)
UR Chart Review Completed  

## 2011-10-12 NOTE — Progress Notes (Signed)
Subjective: He is mildly confused. He is coughing up some sputum. He still coughing. He has some congestion and some wheezing. He says otherwise he's feeling a little bit better  Objective: Vital signs in last 24 hours: Temp:  [97.4 F (36.3 C)-97.9 F (36.6 C)] 97.4 F (36.3 C) (01/21 0626) Pulse Rate:  [95-112] 106  (01/21 0626) Resp:  [20] 20  (01/21 0626) BP: (116-153)/(67-70) 148/67 mmHg (01/21 0626) SpO2:  [88 %-96 %] 96 % (01/21 0742) Weight change:  Last BM Date: 10/10/11  Intake/Output from previous day: 01/20 0701 - 01/21 0700 In: 120 [P.O.:120] Out: 625 [Urine:625]  PHYSICAL EXAM General appearance: alert, mild distress and uncooperative Resp: wheezes bilaterally Cardio: regular rate and rhythm, S1, S2 normal, no murmur, click, rub or gallop GI: soft, non-tender; bowel sounds normal; no masses,  no organomegaly Extremities: extremities normal, atraumatic, no cyanosis or edema  Lab Results:    Basic Metabolic Panel:  Basename 10/12/11 0406 10/11/11 0459  NA 147* 143  K 4.0 4.1  CL 114* 110  CO2 24 23  GLUCOSE 212* 258*  BUN 55* 50*  CREATININE 1.97* 2.04*  CALCIUM 9.6 9.4  MG -- --  PHOS -- --   Liver Function Tests:  Basename 10/11/11 0459  AST 20  ALT 19  ALKPHOS 125*  BILITOT 0.3  PROT 6.5  ALBUMIN 2.7*   No results found for this basename: LIPASE:2,AMYLASE:2 in the last 72 hours No results found for this basename: AMMONIA:2 in the last 72 hours CBC:  Basename 10/12/11 0406 10/10/11 1242  WBC 9.7 10.2  NEUTROABS -- 8.6*  HGB 11.5* 13.0  HCT 35.1* 40.2  MCV 93.4 95.7  PLT 196 175   Cardiac Enzymes: No results found for this basename: CKTOTAL:3,CKMB:3,CKMBINDEX:3,TROPONINI:3 in the last 72 hours BNP: No results found for this basename: PROBNP:3 in the last 72 hours D-Dimer: No results found for this basename: DDIMER:2 in the last 72 hours CBG: No results found for this basename: GLUCAP:6 in the last 72 hours Hemoglobin A1C: No  results found for this basename: HGBA1C in the last 72 hours Fasting Lipid Panel: No results found for this basename: CHOL,HDL,LDLCALC,TRIG,CHOLHDL,LDLDIRECT in the last 72 hours Thyroid Function Tests: No results found for this basename: TSH,T4TOTAL,FREET4,T3FREE,THYROIDAB in the last 72 hours Anemia Panel: No results found for this basename: VITAMINB12,FOLATE,FERRITIN,TIBC,IRON,RETICCTPCT in the last 72 hours Coagulation: No results found for this basename: LABPROT:2,INR:2 in the last 72 hours Urine Drug Screen: Drugs of Abuse  No results found for this basename: labopia, cocainscrnur, labbenz, amphetmu, thcu, labbarb    Alcohol Level: No results found for this basename: ETH:2 in the last 72 hours Urinalysis:  Misc. Labs:  ABGS  Basename 10/11/11 1620  PHART 7.355  PO2ART 64.8*  TCO2 18.8  HCO3 20.8   CULTURES No results found for this or any previous visit (from the past 240 hour(s)). Studies/Results: Dg Chest Port 1 View  10/10/2011  *RADIOLOGY REPORT*  Clinical Data: Cough, congestion, wheeze, shortness of breath  PORTABLE CHEST - 1 VIEW  Comparison: 02/20/2009  Findings: Bilateral lower lobe scarring versus atelectasis. Increasing left upper lobe opacity in area of prior scarring/postsurgical changes.   Underlying chronic interstitial markings/emphysematous changes. No pleural effusion or pneumothorax.  Cardiomediastinal silhouette is within normal limits.  IMPRESSION: No evidence of acute cardiopulmonary disease. Chronic interstitial markings with lower lobe scarring/atelectasis.  Increasing left upper lobe opacity in the area of prior scarring/postsurgical changes. Nonemergent CT chest with contrast is suggested for further evaluation.  Original  Report Authenticated By: Charline Bills, M.D.    Medications:  Scheduled:   . ipratropium  0.5 mg Nebulization Q4H   And  . albuterol  2.5 mg Nebulization Q4H  . ALPRAZolam  0.5 mg Oral Once  . antiseptic oral rinse  15 mL  Mouth Rinse BID  . ciprofloxacin  400 mg Intravenous Q24H  . enoxaparin (LOVENOX) injection  30 mg Subcutaneous Q24H  . methylPREDNISolone (SOLU-MEDROL) injection  125 mg Intravenous Q6H  . pregabalin  50 mg Oral QHS  . sodium chloride      . Tamsulosin HCl  0.4 mg Oral Daily  . DISCONTD: ciprofloxacin  400 mg Intravenous Q12H  . DISCONTD: enoxaparin (LOVENOX) injection  40 mg Subcutaneous Q24H   Continuous:   . sodium chloride 125 mL/hr at 10/10/11 1508  . sodium chloride 75 mL/hr at 10/10/11 1948   ZOX:[id number]  Assesment: He has COPD exacerbation. He has pretty severe COPD. He has also had previous history of lung cancer. He is confused Active Problems:  * No active hospital problems. *     Plan: No change in treatments. Continue antibiotics steroids etc.    LOS: 2 days   Nyimah Shadduck L 10/12/2011, 8:21 AM

## 2011-10-12 NOTE — Progress Notes (Signed)
Pt has been alert with mild confusion this shift.  Is easily re-oriented by staff.  Stated earlier he was worried about his wife.  States that she receives Chemotherapy several days a week in [place] and needed to be home to drive her on Monday 16/06/9603.  Was assisted in calling wife and she reassured him that a neighbor would drive her.  He then settled into bed and has been resting quietly.  Safety sitter remains at bedside.

## 2011-10-13 MED ORDER — LORAZEPAM 2 MG/ML IJ SOLN
0.5000 mg | INTRAMUSCULAR | Status: DC | PRN
  Administered 2011-10-15: 0.5 mg via INTRAVENOUS
  Filled 2011-10-13: qty 1

## 2011-10-13 MED ORDER — METHYLPREDNISOLONE SODIUM SUCC 125 MG IJ SOLR
60.0000 mg | Freq: Four times a day (QID) | INTRAMUSCULAR | Status: DC
  Administered 2011-10-13 – 2011-10-16 (×12): 60 mg via INTRAVENOUS
  Filled 2011-10-13 (×12): qty 2

## 2011-10-13 NOTE — Progress Notes (Signed)
Subjective: He was admitted with COPD and pneumonia. He is still having significant cough and congestion. He is very confused.  Objective: Vital signs in last 24 hours: Temp:  [97.7 F (36.5 C)-97.8 F (36.6 C)] 97.8 F (36.6 C) (01/22 0551) Pulse Rate:  [90-123] 90  (01/22 0551) Resp:  [18-20] 18  (01/22 0551) BP: (139-161)/(67-73) 139/67 mmHg (01/22 0551) SpO2:  [88 %-95 %] 92 % (01/22 0551) Weight change:  Last BM Date: 10/11/11  Intake/Output from previous day: 01/21 0701 - 01/22 0700 In: 4151.3 [P.O.:440; I.V.:3611.3; IV Piggyback:100] Out: 1400 [Urine:1400]  PHYSICAL EXAM General appearance: alert, mild distress and Confused Resp: rhonchi bilaterally Cardio: regular rate and rhythm, S1, S2 normal, no murmur, click, rub or gallop GI: soft, non-tender; bowel sounds normal; no masses,  no organomegaly Extremities: extremities normal, atraumatic, no cyanosis or edema  Lab Results:    Basic Metabolic Panel:  Basename 10/12/11 0406 10/11/11 0459  NA 147* 143  K 4.0 4.1  CL 114* 110  CO2 24 23  GLUCOSE 212* 258*  BUN 55* 50*  CREATININE 1.97* 2.04*  CALCIUM 9.6 9.4  MG -- --  PHOS -- --   Liver Function Tests:  Basename 10/11/11 0459  AST 20  ALT 19  ALKPHOS 125*  BILITOT 0.3  PROT 6.5  ALBUMIN 2.7*   No results found for this basename: LIPASE:2,AMYLASE:2 in the last 72 hours No results found for this basename: AMMONIA:2 in the last 72 hours CBC:  Basename 10/12/11 0406 10/10/11 1242  WBC 9.7 10.2  NEUTROABS -- 8.6*  HGB 11.5* 13.0  HCT 35.1* 40.2  MCV 93.4 95.7  PLT 196 175   Cardiac Enzymes: No results found for this basename: CKTOTAL:3,CKMB:3,CKMBINDEX:3,TROPONINI:3 in the last 72 hours BNP: No results found for this basename: PROBNP:3 in the last 72 hours D-Dimer: No results found for this basename: DDIMER:2 in the last 72 hours CBG: No results found for this basename: GLUCAP:6 in the last 72 hours Hemoglobin A1C: No results found for  this basename: HGBA1C in the last 72 hours Fasting Lipid Panel: No results found for this basename: CHOL,HDL,LDLCALC,TRIG,CHOLHDL,LDLDIRECT in the last 72 hours Thyroid Function Tests: No results found for this basename: TSH,T4TOTAL,FREET4,T3FREE,THYROIDAB in the last 72 hours Anemia Panel: No results found for this basename: VITAMINB12,FOLATE,FERRITIN,TIBC,IRON,RETICCTPCT in the last 72 hours Coagulation: No results found for this basename: LABPROT:2,INR:2 in the last 72 hours Urine Drug Screen: Drugs of Abuse  No results found for this basename: labopia, cocainscrnur, labbenz, amphetmu, thcu, labbarb    Alcohol Level: No results found for this basename: ETH:2 in the last 72 hours Urinalysis:  Misc. Labs:  ABGS  Basename 10/11/11 1620  PHART 7.355  PO2ART 64.8*  TCO2 18.8  HCO3 20.8   CULTURES No results found for this or any previous visit (from the past 240 hour(s)). Studies/Results: No results found.  Medications:  Scheduled:   . ipratropium  0.5 mg Nebulization Q4H   And  . albuterol  2.5 mg Nebulization Q4H  . antiseptic oral rinse  15 mL Mouth Rinse BID  . enoxaparin (LOVENOX) injection  30 mg Subcutaneous Q24H  . levofloxacin (LEVAQUIN) IV  500 mg Intravenous Q24H  . methylPREDNISolone (SOLU-MEDROL) injection  60 mg Intravenous Q6H  . pregabalin  50 mg Oral QHS  . Tamsulosin HCl  0.4 mg Oral Daily  . DISCONTD: methylPREDNISolone (SOLU-MEDROL) injection  125 mg Intravenous Q6H   Continuous:   . sodium chloride 75 mL/hr at 10/12/11 1944  . DISCONTD: sodium  chloride 125 mL/hr at 10/10/11 1508   ZOX:[id number], LORazepam, ondansetron  Assesment: He has COPD exacerbation. He is very confused. He has a history of lung cancer. I think he is somewhat better as far as his chest is concerned that he is still having significant problems with confusion which is impeding his progress Active Problems:  * No active hospital problems. *     Plan: I  will go and asked for physical therapy consultation continue with his other treatments and I'm going to add some Ativan.    LOS: 3 days   Raygen Linquist L 10/13/2011, 8:35 AM

## 2011-10-13 NOTE — Progress Notes (Signed)
I notified Dr. Janna Arch that patient had a 5 beat run of V tach.  Patient asymptomatic.  No new orders received.  Dr. Janna Arch stated to continue watching patient.   P.J. Henderson Newcomer, RN

## 2011-10-14 DIAGNOSIS — Z85118 Personal history of other malignant neoplasm of bronchus and lung: Secondary | ICD-10-CM

## 2011-10-14 DIAGNOSIS — N184 Chronic kidney disease, stage 4 (severe): Secondary | ICD-10-CM | POA: Diagnosis present

## 2011-10-14 DIAGNOSIS — J449 Chronic obstructive pulmonary disease, unspecified: Secondary | ICD-10-CM | POA: Diagnosis present

## 2011-10-14 NOTE — Progress Notes (Signed)
Subjective: He looks much better today. He is less confused. He says he feels better. He still coughing up some greenish sputum.  Objective: Vital signs in last 24 hours: Temp:  [97.9 F (36.6 C)-99.1 F (37.3 C)] 97.9 F (36.6 C) (01/23 1610) Pulse Rate:  [85-103] 102  (01/23 0608) Resp:  [18-20] 20  (01/23 0608) BP: (138-160)/(54-72) 138/72 mmHg (01/23 0608) SpO2:  [91 %-97 %] 97 % (01/23 0711) Weight change:  Last BM Date: 10/11/11  Intake/Output from previous day: 01/22 0701 - 01/23 0700 In: 900 [P.O.:600; I.V.:300] Out: 1200 [Urine:1200]  PHYSICAL EXAM General appearance: alert, cooperative and mild distress Resp: rhonchi bilaterally Cardio: regular rate and rhythm, S1, S2 normal, no murmur, click, rub or gallop GI: soft, non-tender; bowel sounds normal; no masses,  no organomegaly Extremities: extremities normal, atraumatic, no cyanosis or edema  Lab Results:    Basic Metabolic Panel:  Basename 10/12/11 0406  NA 147*  K 4.0  CL 114*  CO2 24  GLUCOSE 212*  BUN 55*  CREATININE 1.97*  CALCIUM 9.6  MG --  PHOS --   Liver Function Tests: No results found for this basename: AST:2,ALT:2,ALKPHOS:2,BILITOT:2,PROT:2,ALBUMIN:2 in the last 72 hours No results found for this basename: LIPASE:2,AMYLASE:2 in the last 72 hours No results found for this basename: AMMONIA:2 in the last 72 hours CBC:  Basename 10/12/11 0406  WBC 9.7  NEUTROABS --  HGB 11.5*  HCT 35.1*  MCV 93.4  PLT 196   Cardiac Enzymes: No results found for this basename: CKTOTAL:3,CKMB:3,CKMBINDEX:3,TROPONINI:3 in the last 72 hours BNP: No results found for this basename: PROBNP:3 in the last 72 hours D-Dimer: No results found for this basename: DDIMER:2 in the last 72 hours CBG: No results found for this basename: GLUCAP:6 in the last 72 hours Hemoglobin A1C: No results found for this basename: HGBA1C in the last 72 hours Fasting Lipid Panel: No results found for this basename:  CHOL,HDL,LDLCALC,TRIG,CHOLHDL,LDLDIRECT in the last 72 hours Thyroid Function Tests: No results found for this basename: TSH,T4TOTAL,FREET4,T3FREE,THYROIDAB in the last 72 hours Anemia Panel: No results found for this basename: VITAMINB12,FOLATE,FERRITIN,TIBC,IRON,RETICCTPCT in the last 72 hours Coagulation: No results found for this basename: LABPROT:2,INR:2 in the last 72 hours Urine Drug Screen: Drugs of Abuse  No results found for this basename: labopia, cocainscrnur, labbenz, amphetmu, thcu, labbarb    Alcohol Level: No results found for this basename: ETH:2 in the last 72 hours Urinalysis:  Misc. Labs:  ABGS  Basename 10/11/11 1620  PHART 7.355  PO2ART 64.8*  TCO2 18.8  HCO3 20.8   CULTURES No results found for this or any previous visit (from the past 240 hour(s)). Studies/Results: No results found.  Medications:  Scheduled:   . ipratropium  0.5 mg Nebulization Q4H   And  . albuterol  2.5 mg Nebulization Q4H  . antiseptic oral rinse  15 mL Mouth Rinse BID  . enoxaparin (LOVENOX) injection  30 mg Subcutaneous Q24H  . levofloxacin (LEVAQUIN) IV  500 mg Intravenous Q24H  . methylPREDNISolone (SOLU-MEDROL) injection  60 mg Intravenous Q6H  . pregabalin  50 mg Oral QHS  . Tamsulosin HCl  0.4 mg Oral Daily   Continuous:   . sodium chloride 75 mL/hr at 10/12/11 1944   RUE:[id number], LORazepam, ondansetron  Assesment: He has COPD exacerbation and clinically I think he probably has pneumonia. He was very confused but is improving now. He has a history of lung cancer. Active Problems:  * No active hospital problems. *  Plan: Continue his treatments I don't think is ready for discharge yet but he certainly much better than yesterday    LOS: 4 days   Norely Schlick L 10/14/2011, 8:39 AM

## 2011-10-14 NOTE — Evaluation (Signed)
Physical Therapy Evaluation Patient Details Name: Paul Gray MRN: [id number] DOB: [birth year] Today's Date: 10/14/2011  Problem List:  Patient Active Problem List  Diagnoses  . Hx of adenomatous colonic polyps  . COPD exacerbation  . H/O: lung cancer  . Chronic renal failure    Past Medical History:  Past Medical History  Diagnosis Date  . Lung cancer   . Back pain   . BPH (benign prostatic hyperplasia)   . S/P colonoscopy 2002, 2007    hyperplastic polyps 2002; pancolonic diverticula 2007  . Hx of adenomatous colonic polyps   . COPD (chronic obstructive pulmonary disease)    Past Surgical History:  Past Surgical History  Procedure Date  . Hernia surgery x 3     inguinal hernia repair  . Cholecystectomy   . Appendectomy   . Radiofrequency ablation for lung cancer     PT Assessment/Plan/Recommendation PT Assessment Clinical Impression Statement: pt very pleasant and cooperative, found to be mildly deconditioned on eval...he now needs a walker for gait, but otherwise is highly functional...he reports that his bedroom at home is on the 2nd floor but in the past have created a bedroom on the 1st floor during a time of illness.Marland KitchenMarland KitchenI would recommend that they do this again in order to increase safety at home...he has all  known needed DME, but would benefit from HHPT at d/c PT Recommendation/Assessment: Patient will need skilled PT in the acute care venue PT Problem List: Decreased activity tolerance;Decreased mobility;Decreased balance;Decreased safety awareness Barriers to Discharge: None PT Therapy Diagnosis : Difficulty walking;Generalized weakness PT Plan PT Frequency: Min 3X/week PT Recommendation Follow Up Recommendations: Home health PT Equipment Recommended: Defer to next venue PT Goals  Acute Rehab PT Goals PT Goal Formulation: With patient Time For Goal Achievement: 2 weeks Pt will Ambulate: >150 feet;with modified independence;with least restrictive assistive  device Pt will Go Up / Down Stairs: Flight;with supervision  PT Evaluation Precautions/Restrictions  Precautions Precautions: Fall Required Braces or Orthoses: No Restrictions Weight Bearing Restrictions: No Prior Functioning  Home Living Lives With: Spouse Receives Help From: Family Type of Home: House Home Layout: Two level;Bed/bath upstairs Alternate Level Stairs-Rails: Right;Left;Can reach both Alternate Level Stairs-Number of Steps: full flight Home Access: Stairs to enter Entrance Stairs-Rails: Right Entrance Stairs-Number of Steps: 2 Bathroom Shower/Tub: Engineer, manufacturing systems: Standard Bathroom Accessibility: Yes How Accessible: Accessible via walker Home Adaptive Equipment: Walker - rolling;Shower chair with back Prior Function Level of Independence: Independent with basic ADLs;Independent with homemaking with ambulation;Independent with gait;Independent with transfers Driving: Yes Vocation: Retired Producer, television/film/video: Awake/alert Overall Cognitive Status: Appears within functional limits for tasks assessed Orientation Level: Oriented to person;Oriented to place Sensation/Coordination Sensation Light Touch: Appears Intact Stereognosis: Not tested Hot/Cold: Not tested Proprioception: Appears Intact Coordination Gross Motor Movements are Fluid and Coordinated: Yes Extremity Assessment RUE Assessment RUE Assessment: Within Functional Limits LUE Assessment LUE Assessment: Within Functional Limits RLE Assessment RLE Assessment: Within Functional Limits LLE Assessment LLE Assessment: Within Functional Limits Mobility (including Balance) Bed Mobility Bed Mobility: Yes Supine to Sit: 7: Independent Sit to Supine: 7: Independent Transfers Transfers: Yes Sit to Stand: 6: Modified independent (Device/Increase time) Stand to Sit: 6: Modified independent (Device/Increase time) Stand Pivot Transfers: 6: Modified independent  (Device/Increase time) Ambulation/Gait Ambulation/Gait: Yes Ambulation/Gait Assistance: 6: Modified independent (Device/Increase time) Ambulation Distance (Feet): 175 Feet Assistive device: Rolling walker Gait Pattern: Within Functional Limits Gait velocity: WNL Stairs: No Wheelchair Mobility Wheelchair Mobility: No  Posture/Postural Control Posture/Postural Control: No  significant limitations Balance Balance Assessed: Yes Dynamic Standing Balance Dynamic Standing - Level of Assistance: 5: Stand by assistance Exercise    End of Session PT - End of Session Equipment Utilized During Treatment: Gait belt Activity Tolerance: Patient tolerated treatment well Patient left: in chair;with call bell in reach;with bed alarm set Nurse Communication: Mobility status for transfers;Mobility status for ambulation General Behavior During Session: [facility] for tasks performed Cognition: [facility] for tasks performed  Konrad Penta 10/14/2011, 10:15 AM

## 2011-10-15 NOTE — Progress Notes (Signed)
[id number] 

## 2011-10-15 NOTE — Progress Notes (Signed)
Physical Therapy Treatment Patient Details Name: Paul Gray MRN: [id number] DOB: 1923/02/01 Today's Date: 10/15/2011  TIME: 906-927/ GT-12 mins TE  PT Assessment/Plan  PT - Assessment/Plan Comments on Treatment Session: Pt tolerated all treatment very well. During ambulation pt O2 sats did drop to 85% wthout O2, upon returning to room and resting for O2 sats remained at 85% until nasal cannula  reapplied  with 2L and O2  sats returned to 93%;pt had no complaints or signs of SOB or dizziness, stated he felt just fine.During sit to stands performed without UE support and had no LOB PT Goals  Acute Rehab PT Goals PT Goal: Ambulate - Progress: Progressing toward goal  PT Treatment Precautions/Restrictions  Precautions Precautions: Fall Required Braces or Orthoses: No Restrictions Weight Bearing Restrictions: No Mobility (including Balance) Bed Mobility Bed Mobility: No Transfers Transfers: Yes Sit to Stand: 7: Independent Stand to Sit: 6: Modified independent (Device/Increase time) (verbal cues to reach back for surface) Ambulation/Gait Ambulation/Gait: Yes Ambulation/Gait Assistance: 5: Supervision Ambulation Distance (Feet): 170 Feet Assistive device: Rolling walker Gait Pattern: Within Functional Limits Stairs: No Wheelchair Mobility Wheelchair Mobility: No    Exercise  General Exercises - Upper Extremity Shoulder Flexion: Both;10 reps Shoulder ABduction: Both;10 reps General Exercises - Lower Extremity Long Arc Quad: Both;20 reps Hip Flexion/Marching: Both (20 reps) Toe Raises: Both;20 reps Heel Raises: Both (20 rreps) Other Exercises Other Exercises: sit <>stand x5 without UE support;no LOB Other Exercises: resisted seated hip ABD and ADD x20  End of Session PT - End of Session Equipment Utilized During Treatment: Gait belt Activity Tolerance: Patient tolerated treatment well;Other (comment) (O2 sats did drop to 85% without O2 during/post  ambulation) Patient left: in chair;with call bell in reach (chair alarm set) General Behavior During Session: [facility] for tasks performed Cognition: [facility] for tasks performed  Paul Gray 10/15/2011, 9:40 AM

## 2011-10-15 NOTE — Progress Notes (Signed)
NAME:  PAGEAldwin, Micalizzi                  ACCOUNT NO.:  [id number]  MEDICAL RECORD NO.:  [id number]  LOCATION:  [id number]                          FACILITY:  [facility]  PHYSICIAN:  Inocencio Roy L. Juanetta Gosling, M.D.DATE OF BIRTH:  1923/01/04  DATE OF PROCEDURE: DATE OF DISCHARGE:                                PROGRESS NOTE   Mr. Kassis was admitted with COPD and clinical pneumonia.  He has improved markedly.  He was very confused, but that is better.  He has no new complaints.  He has been working with physical therapy and he is getting stronger.  PHYSICAL EXAMINATION:  VITAL SIGNS:  This morning shows his blood pressures come up some to 157/73, it was earlier 129/72, temperature is 98.6, pulse is 88 respirations 20, O2 saturation 93% to 96% on nasal cannula. HEART:  Regular without gallop. CHEST:  Still with rhonchi, but much clearer than before. MENTAL STATUS:  Much better.  ASSESSMENT:  He is improved.  PLAN:  To continue with PT.  He will probably be able to be transferred home with [facility] tomorrow.  I do not think he is quite strong enough for discharge today.     Wilkin Lippy L. Juanetta Gosling, M.D.     ELH/MEDQ  D:  10/15/2011  T:  10/15/2011  Job:  [id number]

## 2011-10-15 NOTE — Progress Notes (Signed)
I have received a referral for [facility] Community Case Management for Mr. Farro from Rosemary Holms RN, BSN. I will collaborate with Ms. Leanord Hawking re: Mr. Biermann's discharge date and plan and will contact him within 72 hours of discharge to discuss community case management and arrange follow up. An update will be provided to Dr. Juanetta Gosling and Ms. Robson thereafter. Please feel free to contact me at any time with information or concerns regarding community case management services for Mr. Venne @ [phone] or via e-mail @ [email or web address]. Thank you.

## 2011-10-15 NOTE — Progress Notes (Signed)
CARE MANAGEMENT NOTE 10/15/2011  Patient:  Paul Gray, Paul Gray   Account Number:  [id number]  Date Initiated:  10/12/2011  Documentation initiated by:  Rosemary Holms  Subjective/Objective Assessment:   Pt admitted from home with COPD. Lived with wife. Per pt used [facility] in the past     Action/Plan:   Since pt was not completely clear regarding DC plans, left 2 messages at home for wife to return call. CM to follow.   Anticipated DC Date:  10/15/2011   Anticipated DC Plan:  HOME W HOME HEALTH SERVICES      DC Planning Services  CM consult      Choice offered to / List presented to:          [facility] arranged  HH-1 RN  HH-2 PT      [facility] agency  [facility]   Status of service:  In process, will continue to follow Medicare Important Message given?   (If response is "NO", the following Medicare IM given date fields will be blank) Date Medicare IM given:   Date Additional Medicare IM given:    Discharge Disposition:    Per UR Regulation:    Comments:  10/15/11 1400 Summers Buendia Leanord Hawking RN BSN Spoke w/ pt and his wife. They have agreed to [facility] RN and PT. Chose [facility]. Also referred pt to [facility] for outpatient case management. Pt, Wife, Dr. Juanetta Gosling and [facility] aware and [facility] will contact him shortly after discharge.  10/11/11 1500 Rozalynn Buege Leanord Hawking RN BSN

## 2011-10-16 MED ORDER — LEVOFLOXACIN 500 MG PO TABS: 500.0000 mg | ORAL_TABLET | Freq: Every day | ORAL | Status: AC

## 2011-10-16 MED ORDER — METHYLPREDNISOLONE 4 MG PO KIT: PACK | ORAL | Status: AC

## 2011-10-16 NOTE — Progress Notes (Signed)
10/16/11 900 Jermiah Soderman Leanord Hawking RN BSN CM Room Air sats drop to 87%.

## 2011-10-16 NOTE — Discharge Summary (Signed)
Patient is being discharged home.  He is accompanied by son.  Home Health present in room educating patient and son regarding use of Oxygen which he will being going home with.  Pt has instructions to f/u with Dr. Juanetta Gosling on a prn basis.  He is aware that [facility] will be coming to his home with another O2 tank for his personal use.  All questions and concerns are addressed.  Patient is discharged in stable condition.

## 2011-10-16 NOTE — Discharge Summary (Signed)
Physician Discharge Summary  Patient ID: Paul Gray MRN: [id number] DOB/AGE: [birth year] 78 y.o. Primary Care Physician:Coreon Simkins L, MD, MD Admit date: 10/10/2011 Discharge date: 10/16/2011    Discharge Diagnoses:   Principal Problem:  *COPD exacerbation Active Problems:  H/O: lung cancer  Chronic renal failure acute confusion related to medical illness  Medication List  As of 10/16/2011 12:02 PM   TAKE these medications         ADVAIR DISKUS 250-50 MCG/DOSE Aepb   Generic drug: Fluticasone-Salmeterol   Inhale 1 puff into the lungs every 12 (twelve) hours.      FLOMAX 0.4 MG Caps   Generic drug: Tamsulosin HCl   Take 0.4 mg by mouth daily.      HYDROcodone-acetaminophen 5-500 MG per tablet   Commonly known as: VICODIN   Take 1 tablet by mouth every 6 (six) hours as needed. For back pain      levofloxacin 500 MG tablet   Commonly known as: LEVAQUIN   Take 1 tablet (500 mg total) by mouth daily.      methylPREDNISolone 4 MG tablet   Commonly known as: MEDROL DOSEPAK   follow package directions      pregabalin 50 MG capsule   Commonly known as: LYRICA   Take 50 mg by mouth every evening.      tiotropium 18 MCG inhalation capsule   Commonly known as: SPIRIVA   Place 18 mcg into inhaler and inhale daily.            Discharged Condition:improved    Consults: none  Significant Diagnostic Studies: Dg Chest Port 1 View  10/10/2011  *RADIOLOGY REPORT*  Clinical Data: Cough, congestion, wheeze, shortness of breath  PORTABLE CHEST - 1 VIEW  Comparison: 02/20/2009  Findings: Bilateral lower lobe scarring versus atelectasis. Increasing left upper lobe opacity in area of prior scarring/postsurgical changes.   Underlying chronic interstitial markings/emphysematous changes. No pleural effusion or pneumothorax.  Cardiomediastinal silhouette is within normal limits.  IMPRESSION: No evidence of acute cardiopulmonary disease. Chronic interstitial markings with lower lobe  scarring/atelectasis.  Increasing left upper lobe opacity in the area of prior scarring/postsurgical changes. Nonemergent CT chest with contrast is suggested for further evaluation.  Original Report Authenticated By: Charline Bills, M.D.    Lab Results: Basic Metabolic Panel: No results found for this basename: NA:2,K:2,CL:2,CO2:2,GLUCOSE:2,BUN:2,CREATININE:2,CALCIUM:2,MG:2,PHOS:2 in the last 72 hours Liver Function Tests: No results found for this basename: AST:2,ALT:2,ALKPHOS:2,BILITOT:2,PROT:2,ALBUMIN:2 in the last 72 hours   CBC: No results found for this basename: WBC:2,NEUTROABS:2,HGB:2,HCT:2,MCV:2,PLT:2 in the last 72 hours  No results found for this or any previous visit (from the past 240 hour(s)).   Hospital Course: he was started on intravenous antibiotics and steroids. He initially was very congested and had a lot of difficulty clearing his secretions but this improved with time. He became quite confused and this was felt to be related to his medical illness.his confusion cleared as well.by the time of discharge his mental status was back to baseline. His chest had cleared markedly. His oxygen saturation was 87% so he will go home on oxygen  Discharge Exam: Blood pressure 148/61, pulse 62, temperature 97.8 F (36.6 C), temperature source Oral, resp. rate 20, height 5\' 9"  (1.753 m), weight 77.11 kg (170 lb), SpO2 87.00%. He still had some rhonchi but was in no distress. His heart was regular. He was not confused  Disposition: home with home health services  Discharge Orders    Future Orders Please Complete By Expires   Home  Health      Questions: Responses:   To provide the following care/treatments PT    RN   Face-to-face encounter      Comments:   I Aliegha Paullin L certify that this patient is under my care and that I, or a nurse practitioner or physician's assistant working with me, had a face-to-face encounter that meets the physician face-to-face encounter  requirements with this patient on 10/16/2011.       Questions: Responses:   The encounter with the patient was in whole, or in part, for the following medical condition, which is the primary reason for home health care COPD   I certify that, based on my findings, the following services are medically necessary home health services Nursing    Physical therapy   My clinical findings support the need for the above services Acute exacerbation of COPD   Further, I certify that my clinical findings support that this patient is homebound (i.e. absences from home require considerable and taxing effort and are for medical reasons or religious services or infrequently or of short duration when for other reasons) Ambulates short distances less than 300 feet   To provide the following care/treatments PT    RN   Discharge patient         Follow-up Information    Follow up with Ilayda Toda L, MD. (As needed)       Follow up with [facility]. (RN/PT/ O2  [id number])          Signed: Gwendolynn Merkey L Pager [phone]  10/16/2011, 12:02 PM

## 2011-10-16 NOTE — Progress Notes (Signed)
Subjective: He is doing much better. He is chest is clear he feels better. I discussed his situation with his wife and she does want to have home health services which I think is appropriate  Objective: Vital signs in last 24 hours: Temp:  [97.8 F (36.6 C)-98.4 F (36.9 C)] 97.8 F (36.6 C) (01/25 0502) Pulse Rate:  [62-80] 62  (01/25 0502) Resp:  [20] 20  (01/25 0502) BP: (125-154)/(61-78) 148/61 mmHg (01/25 0502) SpO2:  [93 %-97 %] 96 % (01/25 0707) Weight change:  Last BM Date: 10/14/11  Intake/Output from previous day: 01/24 0701 - 01/25 0700 In: 2820 [P.O.:720; I.V.:1800; IV Piggyback:300] Out: 1377 [Urine:1377]  PHYSICAL EXAM General appearance: alert, cooperative and no distress Resp: rhonchi bilaterally Cardio: regular rate and rhythm, S1, S2 normal, no murmur, click, rub or gallop GI: soft, non-tender; bowel sounds normal; no masses,  no organomegaly Extremities: extremities normal, atraumatic, no cyanosis or edema  Lab Results:    Basic Metabolic Panel: No results found for this basename: NA:2,K:2,CL:2,CO2:2,GLUCOSE:2,BUN:2,CREATININE:2,CALCIUM:2,MG:2,PHOS:2 in the last 72 hours Liver Function Tests: No results found for this basename: AST:2,ALT:2,ALKPHOS:2,BILITOT:2,PROT:2,ALBUMIN:2 in the last 72 hours No results found for this basename: LIPASE:2,AMYLASE:2 in the last 72 hours No results found for this basename: AMMONIA:2 in the last 72 hours CBC: No results found for this basename: WBC:2,NEUTROABS:2,HGB:2,HCT:2,MCV:2,PLT:2 in the last 72 hours Cardiac Enzymes: No results found for this basename: CKTOTAL:3,CKMB:3,CKMBINDEX:3,TROPONINI:3 in the last 72 hours BNP: No results found for this basename: PROBNP:3 in the last 72 hours D-Dimer: No results found for this basename: DDIMER:2 in the last 72 hours CBG: No results found for this basename: GLUCAP:6 in the last 72 hours Hemoglobin A1C: No results found for this basename: HGBA1C in the last 72  hours Fasting Lipid Panel: No results found for this basename: CHOL,HDL,LDLCALC,TRIG,CHOLHDL,LDLDIRECT in the last 72 hours Thyroid Function Tests: No results found for this basename: TSH,T4TOTAL,FREET4,T3FREE,THYROIDAB in the last 72 hours Anemia Panel: No results found for this basename: VITAMINB12,FOLATE,FERRITIN,TIBC,IRON,RETICCTPCT in the last 72 hours Coagulation: No results found for this basename: LABPROT:2,INR:2 in the last 72 hours Urine Drug Screen: Drugs of Abuse  No results found for this basename: labopia, cocainscrnur, labbenz, amphetmu, thcu, labbarb    Alcohol Level: No results found for this basename: ETH:2 in the last 72 hours Urinalysis:  Misc. Labs:  ABGS No results found for this basename: PHART,PCO2,PO2ART,TCO2,HCO3 in the last 72 hours CULTURES No results found for this or any previous visit (from the past 240 hour(s)). Studies/Results: No results found.  Medications:  Prior to Admission:  Prescriptions prior to admission  Medication Sig Dispense Refill  . Fluticasone-Salmeterol (ADVAIR DISKUS) 250-50 MCG/DOSE AEPB Inhale 1 puff into the lungs every 12 (twelve) hours.       Marland Kitchen HYDROcodone-acetaminophen (VICODIN) 5-500 MG per tablet Take 1 tablet by mouth every 6 (six) hours as needed. For back pain      . pregabalin (LYRICA) 50 MG capsule Take 50 mg by mouth every evening.      . Tamsulosin HCl (FLOMAX) 0.4 MG CAPS Take 0.4 mg by mouth daily.       Marland Kitchen tiotropium (SPIRIVA) 18 MCG inhalation capsule Place 18 mcg into inhaler and inhale daily.        Scheduled:   . ipratropium  0.5 mg Nebulization Q4H   And  . albuterol  2.5 mg Nebulization Q4H  . antiseptic oral rinse  15 mL Mouth Rinse BID  . enoxaparin (LOVENOX) injection  30 mg Subcutaneous Q24H  .  levofloxacin (LEVAQUIN) IV  500 mg Intravenous Q24H  . methylPREDNISolone (SOLU-MEDROL) injection  60 mg Intravenous Q6H  . pregabalin  50 mg Oral QHS  . Tamsulosin HCl  0.4 mg Oral Daily   Continuous:    . sodium chloride 75 mL/hr at 10/14/11 1900   NWG:[id number], LORazepam, ondansetron  Assesment: He is admitted with COPD exacerbation and he has improved markedly. He had some hospital-acquired confusion which is resolved. He is ready for discharge Principal Problem:  *COPD exacerbation Active Problems:  H/O: lung cancer  Chronic renal failure    Plan: discharge home with home health services    LOS: 6 days   Austin Herd L 10/16/2011, 8:42 AM

## 2011-10-16 NOTE — Progress Notes (Signed)
CARE MANAGEMENT NOTE 10/16/2011  Patient:  Paul Gray, Paul Gray   Account Number:  [id number]  Date Initiated:  10/12/2011  Documentation initiated by:  Rosemary Holms   Subjective/Objective Assessment:   Pt admitted from home with COPD. Lived with wife. Per pt used [facility] in the past     Action/Plan:   Since pt was not completely clear regarding DC plans, left 2 messages at home for wife to return call. CM to follow.   Anticipated DC Date:  10/15/2011   Anticipated DC Plan:  HOME W HOME HEALTH SERVICES      DC Planning Services  CM consult      Choice offered to / List presented to:     DME arranged  NEBULIZER MACHINE  OXYGEN      DME agency  [facility]     HH arranged  HH-1 RN  HH-2 PT      [facility] agency  [facility]   Status of service:  In process, will continue to follow Medicare Important Message given?   (If response is "NO", the following Medicare IM given date fields will be blank) Date Medicare IM given:   Date Additional Medicare IM given:    Discharge Disposition:    Per UR Regulation:    Comments:  10/16/11 1000 Cuthbert Turton RN BSN O2 2l continuous [place] to be provided by [facility]  10/15/11 1400 Caleigh Rabelo Leanord Hawking RN [employer] w/ pt and his wife. They have agreed to [facility] RN and PT. Chose [facility]. Also referred pt to [facility] for outpatient case management. Pt, Wife, Dr. Juanetta Gosling and [facility] aware and [facility] will contact him shortly after discharge.  10/11/11 1500 Kinslea Frances Leanord Hawking RN BSN

## 2012-04-01 ENCOUNTER — Ambulatory Visit (INDEPENDENT_AMBULATORY_CARE_PROVIDER_SITE_OTHER): Payer: Medicare Other | Admitting: Urology

## 2012-04-01 DIAGNOSIS — R972 Elevated prostate specific antigen [PSA]: Secondary | ICD-10-CM

## 2012-04-01 DIAGNOSIS — N401 Enlarged prostate with lower urinary tract symptoms: Secondary | ICD-10-CM

## 2012-07-25 ENCOUNTER — Other Ambulatory Visit (HOSPITAL_COMMUNITY): Payer: Self-pay | Admitting: Pulmonary Disease

## 2012-07-25 ENCOUNTER — Ambulatory Visit (HOSPITAL_COMMUNITY)
Admission: RE | Admit: 2012-07-25 | Discharge: 2012-07-25 | Disposition: A | Discharge status: Home / Self Care | Payer: Medicare Other | Source: Ambulatory Visit | Attending: Pulmonary Disease | Admitting: Pulmonary Disease

## 2012-07-25 DIAGNOSIS — J4489 Other specified chronic obstructive pulmonary disease: Secondary | ICD-10-CM | POA: Insufficient documentation

## 2012-07-25 DIAGNOSIS — R079 Chest pain, unspecified: Secondary | ICD-10-CM | POA: Insufficient documentation

## 2012-07-25 DIAGNOSIS — J449 Chronic obstructive pulmonary disease, unspecified: Secondary | ICD-10-CM | POA: Insufficient documentation

## 2012-07-25 DIAGNOSIS — Z85118 Personal history of other malignant neoplasm of bronchus and lung: Secondary | ICD-10-CM | POA: Insufficient documentation

## 2012-08-12 ENCOUNTER — Ambulatory Visit (INDEPENDENT_AMBULATORY_CARE_PROVIDER_SITE_OTHER): Payer: Medicare Other | Admitting: Cardiology

## 2012-08-12 ENCOUNTER — Encounter: Payer: Self-pay | Admitting: *Deleted

## 2012-08-12 ENCOUNTER — Encounter: Payer: Self-pay | Admitting: Cardiology

## 2012-08-12 VITALS — BP 140/70 | HR 77 | Ht 69.0 in | Wt 172.0 lb

## 2012-08-12 DIAGNOSIS — N184 Chronic kidney disease, stage 4 (severe): Secondary | ICD-10-CM

## 2012-08-12 DIAGNOSIS — R072 Precordial pain: Secondary | ICD-10-CM | POA: Insufficient documentation

## 2012-08-12 DIAGNOSIS — J449 Chronic obstructive pulmonary disease, unspecified: Secondary | ICD-10-CM

## 2012-08-12 DIAGNOSIS — R079 Chest pain, unspecified: Secondary | ICD-10-CM

## 2012-08-12 NOTE — Assessment & Plan Note (Signed)
Predominantly atypical features, now resolved. ECG shows nonspecific T wave changes with ectopy. Also has chronic lung disease which could have played a component although recent chest x-ray showed relatively stable findings. Plan is to evaluate for underlying ischemic heart disease with cardiac risk factors including age and gender, previous tobacco use, and chronic kidney disease. A Lexiscan Myoview will be arranged and we will inform him of the results.

## 2012-08-12 NOTE — Progress Notes (Signed)
Clinical Summary Mr. Cocanougher is an 78 y.o.male presenting for cardiology consultation in referral from Dr. Juanetta Gosling. He is here with his wife. He reports a prior three-week history of nearly constant left-sided chest pressure, states that it was overall mild to moderate intensity, not worsened by exertion. He denies any progressive shortness of breath over baseline with these symptoms, no palpitations. He states that the symptoms have resolved at present.  Recent chest x-ray on 11/4 showed stable density in the left apex, no other acute focal abnormality. Faxed copy of ECG from earlier this year showed sinus rhythm with low voltage, decreased R wave progression in the precordium, leftward axis. ECG today shows sinus rhythm with PVC and PAC, NST changes.  He denies any personal history of CAD or myocardial infarction. He does have a history of lung cancer status post ablation.   Allergies  Allergen Reactions  . Penicillins Swelling    Current Outpatient Prescriptions  Medication Sig Dispense Refill  . Fluticasone-Salmeterol (ADVAIR DISKUS) 250-50 MCG/DOSE AEPB Inhale 1 puff into the lungs every 12 (twelve) hours.       Marland Kitchen HYDROcodone-acetaminophen (VICODIN) 5-500 MG per tablet Take 1 tablet by mouth every 6 (six) hours as needed. For back pain      . pregabalin (LYRICA) 50 MG capsule Take 50 mg by mouth every evening.      . Tamsulosin HCl (FLOMAX) 0.4 MG CAPS Take 0.4 mg by mouth daily.       Marland Kitchen tiotropium (SPIRIVA) 18 MCG inhalation capsule Place 18 mcg into inhaler and inhale daily.         Past Medical History  Diagnosis Date  . Lung cancer   . Back pain   . BPH (benign prostatic hyperplasia)   . S/P colonoscopy 2002, 2007    Hyperplastic polyps 2002; pancolonic diverticula 2007  . Hx of adenomatous colonic polyps   . COPD (chronic obstructive pulmonary disease)   . CKD (chronic kidney disease) stage 4, GFR 15-29 ml/min     Creatinine 2.0    Past Surgical History  Procedure Date    . Hernia surgery x 3     Inguinal hernia repair  . Cholecystectomy   . Appendectomy   . Radiofrequency ablation for lung cancer     Family History  Problem Relation Age of Onset  . CAD Brother     Not premature  . Heart failure Father     Died in his 33s    Social History Mr. Stockstill reports that he has quit smoking. His smoking use included Cigarettes. He does not have any smokeless tobacco history on file. Mr. Macioce reports that he does not drink alcohol.  Review of Systems Hard of hearing, wears hearing aids. Normal appetite. No orthopnea or PND. No claudication. No edema. Otherwise negative except as outlined.  Physical Examination Filed Vitals:   08/12/12 0839  BP: 140/70  Pulse: 77   Filed Weights   08/12/12 0839  Weight: 172 lb (78.019 kg)   Overweight male in no acute distress. HEENT: Conjunctiva and lids normal, oropharynx clear. Neck: Supple, no elevated JVP or carotid bruits, no thyromegaly. Lungs: Decreased breath sounds, no wheezes, nonlabored breathing at rest. Cardiac: Regular rate and rhythm, no S3, soft systolic murmur, no pericardial rub. Abdomen: Soft, nontender, bowel sounds present. Extremities: No pitting edema, distal pulses 2+. Skin: Warm and dry. Musculoskeletal: No kyphosis. Neuropsychiatric: Alert and oriented x3, affect grossly appropriate.   Problem List and Plan   Precordial pain Predominantly  atypical features, now resolved. ECG shows nonspecific T wave changes with ectopy. Also has chronic lung disease which could have played a component although recent chest x-ray showed relatively stable findings. Plan is to evaluate for underlying ischemic heart disease with cardiac risk factors including age and gender, previous tobacco use, and chronic kidney disease. A Lexiscan Myoview will be arranged and we will inform him of the results.  CKD (chronic kidney disease), stage IV Creatinine 2.0 based on lab work from earlier in the year.  COPD  (chronic obstructive pulmonary disease) Followed by Dr. Juanetta Gosling, does not require oxygen at home. Reports relative stability over the last several months. Exacerbation back in January.    Jonelle Sidle, M.D., F.A.C.C.

## 2012-08-12 NOTE — Assessment & Plan Note (Signed)
Followed by Dr. Juanetta Gosling, does not require oxygen at home. Reports relative stability over the last several months. Exacerbation back in January.

## 2012-08-12 NOTE — Patient Instructions (Addendum)
Your physician has requested that you have a lexiscan myoview. For further information please visit [email or web address] Please follow instruction sheet, as given.  WE WILL CALL YOU WITH YOUR RESULTS  Your physician recommends that you schedule a follow-up appointment in: AS NEEDED

## 2012-08-12 NOTE — Assessment & Plan Note (Signed)
Creatinine 2.0 based on lab work from earlier in the year.

## 2012-08-26 ENCOUNTER — Encounter (HOSPITAL_COMMUNITY)
Admission: RE | Admit: 2012-08-26 | Discharge: 2012-08-26 | Disposition: A | Discharge status: Home / Self Care | Payer: Medicare Other | Source: Ambulatory Visit | Attending: Cardiology | Admitting: Cardiology

## 2012-08-26 ENCOUNTER — Encounter (HOSPITAL_COMMUNITY): Payer: Self-pay | Admitting: Internal Medicine

## 2012-08-26 ENCOUNTER — Ambulatory Visit (HOSPITAL_COMMUNITY)
Admission: RE | Admit: 2012-08-26 | Discharge: 2012-08-26 | Disposition: A | Discharge status: Home / Self Care | Payer: Medicare Other | Source: Ambulatory Visit | Attending: Cardiology | Admitting: Cardiology

## 2012-08-26 ENCOUNTER — Encounter (HOSPITAL_COMMUNITY): Payer: Self-pay

## 2012-08-26 DIAGNOSIS — R072 Precordial pain: Secondary | ICD-10-CM | POA: Insufficient documentation

## 2012-08-26 DIAGNOSIS — R079 Chest pain, unspecified: Secondary | ICD-10-CM | POA: Insufficient documentation

## 2012-08-26 MED ORDER — TECHNETIUM TC 99M SESTAMIBI - CARDIOLITE
30.0000 | Freq: Once | INTRAVENOUS | Status: AC | PRN
  Administered 2012-08-26: 11:00:00 30 via INTRAVENOUS

## 2012-08-26 MED ORDER — SODIUM CHLORIDE 0.9 % IJ SOLN
INTRAMUSCULAR | Status: AC
  Administered 2012-08-26: 10 mL via INTRAVENOUS
  Filled 2012-08-26: qty 10

## 2012-08-26 MED ORDER — REGADENOSON 0.4 MG/5ML IV SOLN
INTRAVENOUS | Status: AC
  Administered 2012-08-26: 0.4 mg via INTRAVENOUS
  Filled 2012-08-26: qty 5

## 2012-08-26 MED ORDER — TECHNETIUM TC 99M SESTAMIBI - CARDIOLITE
10.0000 | Freq: Once | INTRAVENOUS | Status: AC | PRN
  Administered 2012-08-26: 09:00:00 9.8 via INTRAVENOUS

## 2012-08-26 NOTE — Progress Notes (Signed)
Stress Lab Nurses Notes - Norval Slaven Coombs 08/26/2012 Reason for doing test: Chest Pain Type of test: Marlane Hatcher Nurse performing test: Parke Poisson, RN Nuclear Medicine Tech: Lou Cal Echo Tech: Not Applicable MD performing test: Lovina Reach & Joni Reining NP Family MD: Juanetta Gosling Test explained and consent signed: yes IV started: 22g jelco, Saline lock flushed, No redness or edema and Saline lock started in radiology Symptoms: dizziness Treatment/Intervention: None Reason test stopped: protocol completed After recovery IV was: Discontinued via X-ray tech and No redness or edema Patient to return to Nuc. Med at : 11:15 Patient discharged: Home Patient's Condition upon discharge was: stable Comments: During test BP 118/57 & HR 84.  Recovery BP 116/58 & HR 70.  Symptoms resolved in recovery. Erskine Speed T

## 2012-12-27 IMAGING — CR DG CHEST 1V PORT
1 series · 1 of 1 positions shown · non-contrast
Comparison: 02/20/2009

CLINICAL DATA: Cough, congestion, wheeze, shortness of breath

PORTABLE CHEST - 1 VIEW

[view not recorded]
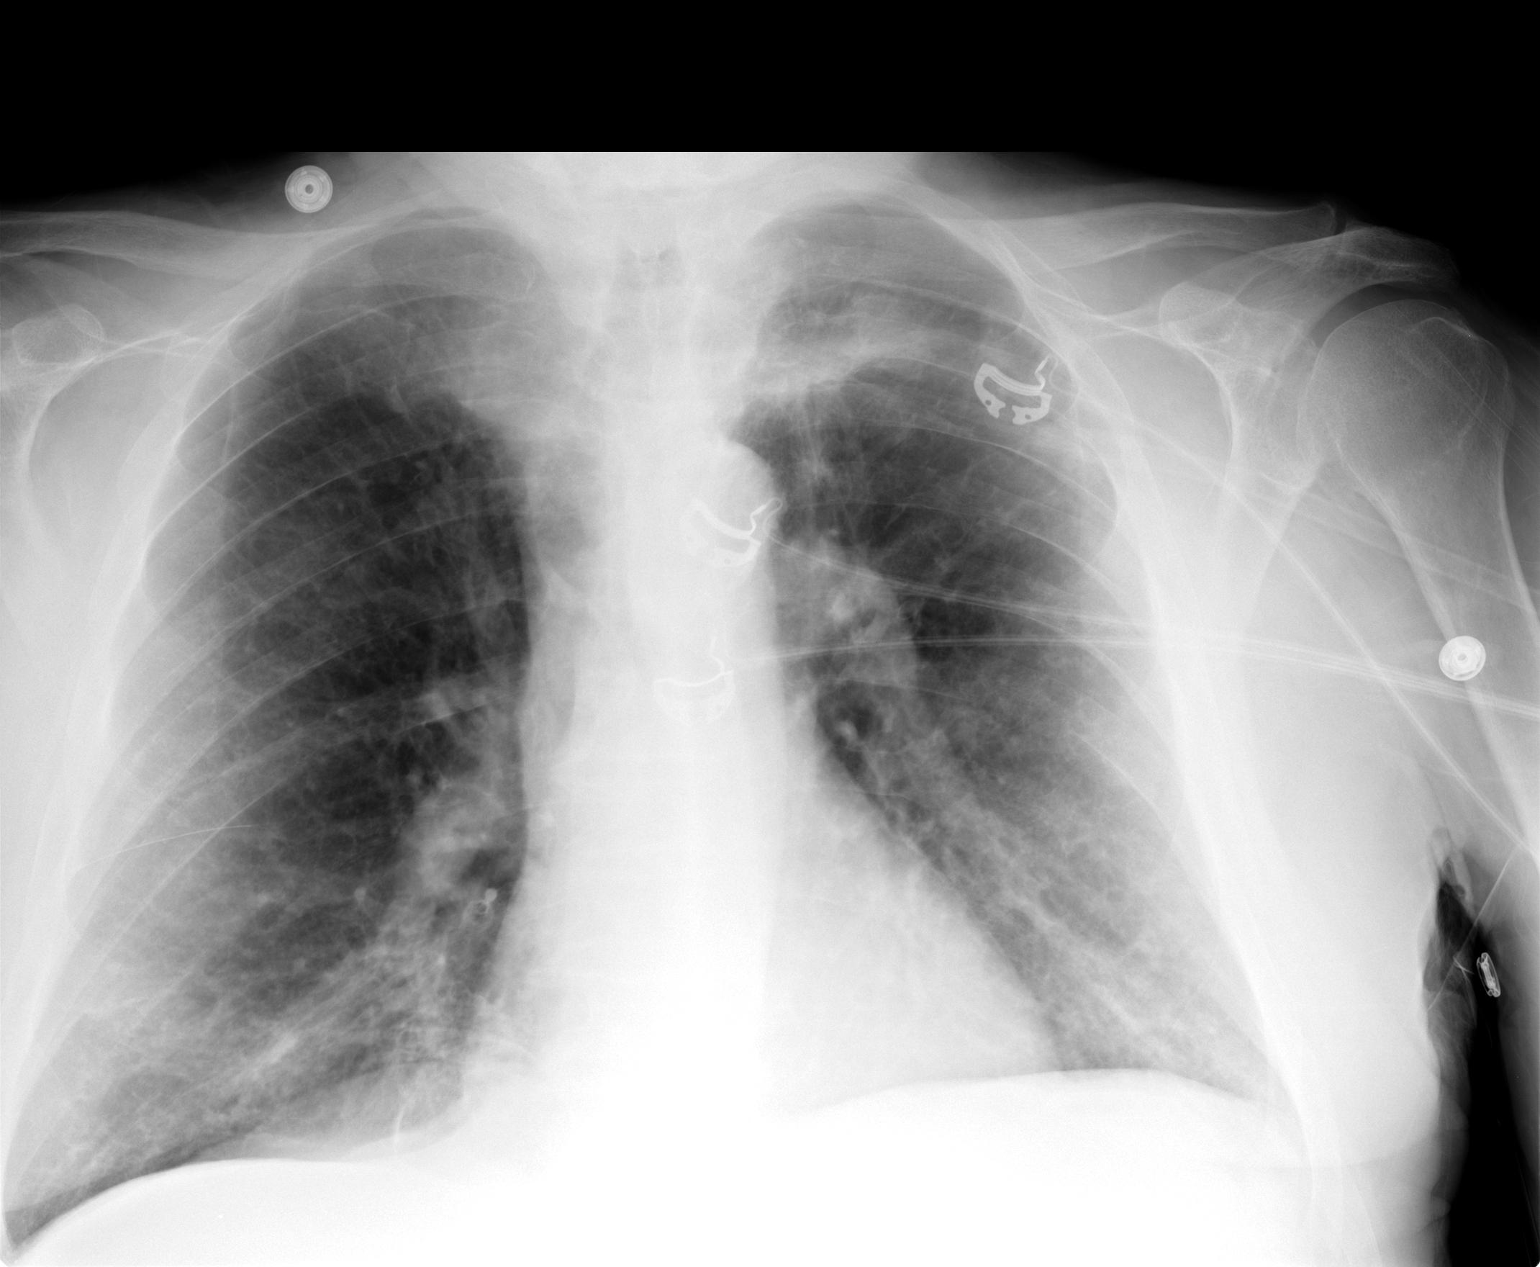

[1 of 1 positions shown; findings below may reference images not displayed]

FINDINGS: Bilateral lower lobe scarring versus atelectasis.
Increasing left upper lobe opacity in area of prior
scarring/postsurgical changes.   Underlying chronic interstitial
markings/emphysematous changes. No pleural effusion or
pneumothorax.

Cardiomediastinal silhouette is within normal limits.
IMPRESSION: No evidence of acute cardiopulmonary disease. Chronic interstitial
markings with lower lobe scarring/atelectasis.

Increasing left upper lobe opacity in the area of prior
scarring/postsurgical changes. Nonemergent CT chest with contrast
is suggested for further evaluation.

## 2013-04-14 ENCOUNTER — Ambulatory Visit (INDEPENDENT_AMBULATORY_CARE_PROVIDER_SITE_OTHER): Payer: Medicare Other | Admitting: Urology

## 2013-04-14 DIAGNOSIS — N478 Other disorders of prepuce: Secondary | ICD-10-CM

## 2013-04-14 DIAGNOSIS — N401 Enlarged prostate with lower urinary tract symptoms: Secondary | ICD-10-CM

## 2013-10-10 ENCOUNTER — Ambulatory Visit (HOSPITAL_COMMUNITY)
Admission: RE | Admit: 2013-10-10 | Discharge: 2013-10-10 | Disposition: A | Discharge status: Home / Self Care | Payer: Medicare Other | Source: Ambulatory Visit | Attending: Pulmonary Disease | Admitting: Pulmonary Disease

## 2013-10-10 ENCOUNTER — Other Ambulatory Visit (HOSPITAL_COMMUNITY): Payer: Self-pay | Admitting: Pulmonary Disease

## 2013-10-10 DIAGNOSIS — R059 Cough, unspecified: Secondary | ICD-10-CM

## 2013-10-10 DIAGNOSIS — R0989 Other specified symptoms and signs involving the circulatory and respiratory systems: Secondary | ICD-10-CM | POA: Insufficient documentation

## 2013-10-10 DIAGNOSIS — R05 Cough: Secondary | ICD-10-CM | POA: Insufficient documentation

## 2013-10-10 DIAGNOSIS — J984 Other disorders of lung: Secondary | ICD-10-CM | POA: Insufficient documentation

## 2013-10-29 ENCOUNTER — Encounter (HOSPITAL_COMMUNITY): Payer: Self-pay | Admitting: Emergency Medicine

## 2013-10-29 ENCOUNTER — Emergency Department (HOSPITAL_COMMUNITY)
Admission: EM | Admit: 2013-10-29 | Discharge: 2013-10-29 | Disposition: A | Discharge status: Home / Self Care | Payer: Medicare Other | Attending: Emergency Medicine | Admitting: Emergency Medicine

## 2013-10-29 ENCOUNTER — Emergency Department (HOSPITAL_COMMUNITY): Payer: Medicare Other

## 2013-10-29 DIAGNOSIS — Z79899 Other long term (current) drug therapy: Secondary | ICD-10-CM | POA: Insufficient documentation

## 2013-10-29 DIAGNOSIS — R339 Retention of urine, unspecified: Secondary | ICD-10-CM

## 2013-10-29 DIAGNOSIS — Z860100 Personal history of colon polyps, unspecified: Secondary | ICD-10-CM | POA: Insufficient documentation

## 2013-10-29 DIAGNOSIS — N138 Other obstructive and reflux uropathy: Secondary | ICD-10-CM | POA: Insufficient documentation

## 2013-10-29 DIAGNOSIS — N184 Chronic kidney disease, stage 4 (severe): Secondary | ICD-10-CM | POA: Insufficient documentation

## 2013-10-29 DIAGNOSIS — Z87891 Personal history of nicotine dependence: Secondary | ICD-10-CM | POA: Insufficient documentation

## 2013-10-29 DIAGNOSIS — J449 Chronic obstructive pulmonary disease, unspecified: Secondary | ICD-10-CM | POA: Insufficient documentation

## 2013-10-29 DIAGNOSIS — IMO0002 Reserved for concepts with insufficient information to code with codable children: Secondary | ICD-10-CM | POA: Insufficient documentation

## 2013-10-29 DIAGNOSIS — Z85118 Personal history of other malignant neoplasm of bronchus and lung: Secondary | ICD-10-CM | POA: Insufficient documentation

## 2013-10-29 DIAGNOSIS — N401 Enlarged prostate with lower urinary tract symptoms: Secondary | ICD-10-CM | POA: Insufficient documentation

## 2013-10-29 DIAGNOSIS — J4489 Other specified chronic obstructive pulmonary disease: Secondary | ICD-10-CM | POA: Insufficient documentation

## 2013-10-29 DIAGNOSIS — Z880 Allergy status to penicillin: Secondary | ICD-10-CM | POA: Insufficient documentation

## 2013-10-29 LAB — CBC WITH DIFFERENTIAL/PLATELET
BASOS ABS: 0 10*3/uL (ref 0.0–0.1)
Basophils Relative: 0 % (ref 0–1)
EOS ABS: 0 10*3/uL (ref 0.0–0.7)
EOS PCT: 0 % (ref 0–5)
HCT: 38.3 % — ABNORMAL LOW (ref 39.0–52.0)
Hemoglobin: 12.3 g/dL — ABNORMAL LOW (ref 13.0–17.0)
Lymphocytes Relative: 9 % — ABNORMAL LOW (ref 12–46)
Lymphs Abs: 1.4 10*3/uL (ref 0.7–4.0)
MCH: 30.3 pg (ref 26.0–34.0)
MCHC: 32.1 g/dL (ref 30.0–36.0)
MCV: 94.3 fL (ref 78.0–100.0)
Monocytes Absolute: 0.8 10*3/uL (ref 0.1–1.0)
Monocytes Relative: 5 % (ref 3–12)
NEUTROS PCT: 86 % — AB (ref 43–77)
Neutro Abs: 13.4 10*3/uL — ABNORMAL HIGH (ref 1.7–7.7)
PLATELETS: 113 10*3/uL — AB (ref 150–400)
RBC: 4.06 MIL/uL — AB (ref 4.22–5.81)
RDW: 15.2 % (ref 11.5–15.5)
WBC: 15.7 10*3/uL — AB (ref 4.0–10.5)

## 2013-10-29 LAB — URINALYSIS, ROUTINE W REFLEX MICROSCOPIC
Bilirubin Urine: NEGATIVE
Glucose, UA: NEGATIVE mg/dL
Leukocytes, UA: NEGATIVE
Nitrite: NEGATIVE
Specific Gravity, Urine: 1.02 (ref 1.005–1.030)
Urobilinogen, UA: 0.2 mg/dL (ref 0.0–1.0)
pH: 6 (ref 5.0–8.0)

## 2013-10-29 LAB — BASIC METABOLIC PANEL WITH GFR
BUN: 33 mg/dL — ABNORMAL HIGH (ref 6–23)
CO2: 25 meq/L (ref 19–32)
Calcium: 8.4 mg/dL (ref 8.4–10.5)
Chloride: 101 meq/L (ref 96–112)
Creatinine, Ser: 2.16 mg/dL — ABNORMAL HIGH (ref 0.50–1.35)
GFR calc Af Amer: 30 mL/min — ABNORMAL LOW
GFR calc non Af Amer: 26 mL/min — ABNORMAL LOW
Glucose, Bld: 124 mg/dL — ABNORMAL HIGH (ref 70–99)
Potassium: 4.6 meq/L (ref 3.7–5.3)
Sodium: 139 meq/L (ref 137–147)

## 2013-10-29 LAB — URINE MICROSCOPIC-ADD ON

## 2013-10-29 MED ORDER — CIPROFLOXACIN HCL 500 MG PO TABS: 500.0000 mg | ORAL_TABLET | Freq: Two times a day (BID) | ORAL | Status: DC

## 2013-10-29 NOTE — ED Notes (Signed)
Foley irrigated with 121ml of sterile water with  bloody tinged urine noted. Dr. Wyvonnia Dusky notified

## 2013-10-29 NOTE — ED Provider Notes (Signed)
CSN: [id number]     Arrival date & time 10/29/13  1549 History  This chart was scribed for Ezequiel Essex, MD by Zettie Pho, ED Scribe. This patient was seen in room APA12/AP[facility]12 and the patient's care was started at 4:44 PM.    Chief Complaint  Patient presents with  . Urinary Retention   The history is provided by the patient. No language interpreter was used.   HPI Comments: Paul Gray is a 80 y.o. Male with a history of chronic kidney disease and benign prostatic hyperplasia who presents to the Emergency Department complaining of burning dysuria with associated urinary retention and testicular pain onset about 3-4 days ago that has been progressively worsening. He reports having the sensation of needing to urinate, but that he is only able to produce a small amount of urine. Patient states these symptoms are new for him. He denies back pain, numbness, paresthesias, flank pain. Patient also has a history of lung cancer, adenomatous colonic polyps, and COPD.   Past Medical History  Diagnosis Date  . Lung cancer   . Back pain   . BPH (benign prostatic hyperplasia)   . S/P colonoscopy 2002, 2007    Hyperplastic polyps 2002; pancolonic diverticula 2007  . Hx of adenomatous colonic polyps   . COPD (chronic obstructive pulmonary disease)   . CKD (chronic kidney disease) stage 4, GFR 15-29 ml/min     Creatinine 2.0   Past Surgical History  Procedure Laterality Date  . Hernia surgery x 3      Inguinal hernia repair  . Cholecystectomy    . Appendectomy    . Radiofrequency ablation for lung cancer     Family History  Problem Relation Age of Onset  . CAD Brother     Not premature  . Heart failure Father     Died in his 69s   History  Substance Use Topics  . Smoking status: Former Smoker    Types: Cigarettes  . Smokeless tobacco: Not on file     Comment: quit 6 years ago, about 1 ppd X 60+ years  . Alcohol Use: No    Review of Systems  A complete 10 system review of systems  was obtained and all systems are negative except as noted in the HPI and PMH.   Allergies  Penicillins  Home Medications   Current Outpatient Rx  Name  Route  Sig  Dispense  Refill  . donepezil (ARICEPT) 10 MG tablet   Oral   Take 10 mg by mouth daily.         . Fluticasone-Salmeterol (ADVAIR DISKUS) 250-50 MCG/DOSE AEPB   Inhalation   Inhale 1 puff into the lungs every 12 (twelve) hours.          Marland Kitchen HYDROcodone-acetaminophen (NORCO/VICODIN) 5-325 MG per tablet   Oral   Take 1 tablet by mouth every 6 (six) hours as needed. pain         . pregabalin (LYRICA) 50 MG capsule   Oral   Take 50 mg by mouth every evening.         . Tamsulosin HCl (FLOMAX) 0.4 MG CAPS   Oral   Take 0.4 mg by mouth daily.          . temazepam (RESTORIL) 15 MG capsule   Oral   Take 15 mg by mouth at bedtime.         Marland Kitchen tiotropium (SPIRIVA) 18 MCG inhalation capsule   Inhalation   Place 18  mcg into inhaler and inhale daily.          . ciprofloxacin (CIPRO) 500 MG tablet   Oral   Take 1 tablet (500 mg total) by mouth every 12 (twelve) hours.   10 tablet   0    Triage Vitals: BP 144/79  Pulse 100  Temp(Src) 98.9 F (37.2 C) (Oral)  Resp 16  Ht 5\' 9"  (1.753 m)  Wt 170 lb (77.111 kg)  BMI 25.09 kg/m2  SpO2 98%  Physical Exam  Nursing note and vitals reviewed. Constitutional: He is oriented to person, place, and time. He appears well-developed and well-nourished. No distress.  HENT:  Head: Normocephalic and atraumatic.  Eyes: Conjunctivae are normal.  Neck: Normal range of motion. Neck supple.  Cardiovascular: Normal rate, regular rhythm, normal heart sounds and intact distal pulses.   Pulses:      Femoral pulses are 2+ on the right side, and 2+ on the left side.      Dorsalis pedis pulses are 2+ on the right side, and 2+ on the left side.  Pulmonary/Chest: Effort normal and breath sounds normal. No respiratory distress.  Abdominal: He exhibits no distension.  Suprapubic  fullness. No CVA tenderness.   Limited bedside ultrasound revealed bladder decompressed with foley within the bladder lumen.   Genitourinary:  No scrotal swelling or testicular tenderness.   Exam was performed with patient's permission. Chaperone (scribe) was present. The exam was performed with no discomfort or complications.    Musculoskeletal: Normal range of motion.  5/5 strength in bilateral lower extremities. Ankle plantar and dorsiflexion intact. Great toe extension intact bilaterally. +2 DP and PT pulses. +2 patellar reflexes bilaterally. Normal gait.   Neurological: He is alert and oriented to person, place, and time.  Skin: Skin is warm and dry.  Psychiatric: He has a normal mood and affect. His behavior is normal.    ED Course  Procedures (including critical care time)  DIAGNOSTIC STUDIES: Oxygen Saturation is 98% on room air, normal by my interpretation.    COORDINATION OF CARE: 4:48 PM- Ordered UA. Will order blood labs. Will insert a foley catheter to manage symptoms. Discussed treatment plan with patient at bedside and patient verbalized agreement.   9:02 PM- Patient states that he is still having a burning sensation even with the catheter in-place. Will flush the catheter. Discussed treatment plan with patient at bedside and patient verbalized agreement.   9:57 PM- Catheter was flushed. Nurse noted blood in the catheter bag. Patient told nurse that his discomfort was initially better, but that the burning sensation has since returned and has been progressively worsening.  10:06 PM- Patient states that the discomfort has been persistent. Discussed that CT results indicated that the catheter was not in the appropriate position the first time. Performed a limited bedside ultrasound to confirm correct placement of the catheter. Will discharge patient with foley catheter. Advised him to follow up with the urologist in a few days to have it removed. Discussed treatment plan  with patient at bedside and patient verbalized agreement.    Labs Review Labs Reviewed  URINALYSIS, ROUTINE W REFLEX MICROSCOPIC - Abnormal; Notable for the following:    APPearance HAZY (*)    Hgb urine dipstick MODERATE (*)    Ketones, ur TRACE (*)    Protein, ur TRACE (*)    All other components within normal limits  BASIC METABOLIC PANEL - Abnormal; Notable for the following:    Glucose, Bld 124 (*)  BUN 33 (*)    Creatinine, Ser 2.16 (*)    GFR calc non Af Amer 26 (*)    GFR calc Af Amer 30 (*)    All other components within normal limits  CBC WITH DIFFERENTIAL - Abnormal; Notable for the following:    WBC 15.7 (*)    RBC 4.06 (*)    Hemoglobin 12.3 (*)    HCT 38.3 (*)    Platelets 113 (*)    Neutrophils Relative % 86 (*)    Neutro Abs 13.4 (*)    Lymphocytes Relative 9 (*)    All other components within normal limits  URINE MICROSCOPIC-ADD ON - Abnormal; Notable for the following:    Bacteria, UA FEW (*)    Casts HYALINE CASTS (*)    All other components within normal limits   Imaging Review Ct Abdomen Pelvis Wo Contrast  10/29/2013   CLINICAL DATA:  Burning with urination. Inability to urinate. Back pain. Multiple chronic medical problems. Urinary retention.  EXAM: CT ABDOMEN AND PELVIS WITHOUT CONTRAST  TECHNIQUE: Multidetector CT imaging of the abdomen and pelvis was performed following the standard protocol without intravenous contrast.  COMPARISON:  CT ABDOMEN W/CM dated 12/24/2005; NM PET TUM IMG SKULL BASE T - THIGH dated 01/01/2006; DG LUMBAR SPINE COMPLETE dated 04/10/2011  FINDINGS: Lung Bases: In the right lower lobe laterally, there is a 7 mm peripheral pulmonary nodule which is unchanged compared to 2007, compatible with a benign nodule. Fat containing diaphragmatic hernia is present on the right. This is compatible with a right-sided Bochdalek's hernia, best seen on sagittal images.  Liver: Unenhanced CT was performed per clinician order. Lack of IV contrast limits  sensitivity and specificity, especially for evaluation of abdominal/pelvic solid viscera. Old granulomatous calcification.  Spleen:  Normal.  Gallbladder:  Absent, presumed cholecystectomy.  Common bile duct:  Normal.  Pancreas:  Normal.  Adrenal glands:  Normal bilaterally.  Kidneys: Unchanged exophytic left interpolar renal cyst. Punctate right renal collecting system calculus. Mild ectasia of the right ureter. Left ureter appears normal.  Stomach:  Collapsed.  Grossly normal.  Small bowel: Large periampullary duodenal diverticulum. No small bowel inflammatory changes or mesenteric adenopathy.  Colon: No right lower quadrant inflammatory changes are present. Appendix not identified. Colonic diverticulosis without diverticulitis.  Pelvic Genitourinary: Prostatomegaly is present. Transverse measurement of the prostate gland is 6.5 cm. There is a Foley catheter. The balloon from the Foley catheter is inflated within the prostate gland itself, best seen on sagittal images (image 70 series 5). The tip of the Foley catheter is at the base of the bladder.  There are multiple calcifications which appear to be within the urinary bladder, compatible with bladder stones (each measuring about 5 mm) associated with chronic stasis. The bladder is partially decompressed with mural thickening. This may relate to chronic bladder outflow obstruction from prostatomegaly. There is no inguinal adenopathy.  Bones: Severe lumbar spondylosis with spondylolisthesis and ankylosis at L4-L5. Rightward slip of L3 on L4 measuring 12 mm. Chronically abnormal mineralization of the sacrum which appears unchanged compared to 2007. This may relate to fibrous dysplasia or Paget's disease. Scattered benign-appearing sclerotic lesions are present compatible with bone islands and unchanged compared to prior.  Vasculature: Atherosclerosis. Tortuosity of the iliofemoral system. No acute vascular abnormality allowing for noncontrast technique.  Body Wall:  Normal.  IMPRESSION: 1. Prostatomegaly. Foley catheter is present with the tip at the base of the bladder and the balloon inflated within the prostate gland proper (see  discussion above). Adjustment of catheter position is recommended. The catheter should be advanced at least 5.6 cm to position the balloon within the bladder base. 2. Bladder calculi likely secondary chronic bladder outflow obstruction. Mural thickening of the urinary bladder likely due to same. Bladder is only mildly distended. 3. Nonobstructing punctate right renal collecting system calculi. 4. Abnormal mineralization of the sacrum appears chronic and benign. 5. Benign right lower lobe subpleural pulmonary nodule measuring 7 mm. 6. Right ureteral ectasia which could be associated with ascending urinary tract infection or chronic bladder outflow obstruction. Correlation with urinalysis recommended.   Electronically Signed   By: Dereck Ligas M.D.   On: 10/29/2013 21:37    EKG Interpretation   None       MDM   1. Urinary retention    Burning with urination and voiding small amounts for the past 3 days. Associated with abdominal fullness. Denies any back pain, fevers or vomiting.  Equal lower extremity strength on exam. No evidence of cord compression or cauda equina.  Creatinine elevated at baseline.  Foley catheter placed by nursing staff with 300 cc of clear urine output. Patient continues to complain of abdominal fullness. Imaging shows that Foley catheter is in the wrong position and remains in the prostate gland.  D/w nursing staff and foley was advanced ~ 5 cm.  Patient reports relief from pressure sensation. Foley draining blood tinged urine.  US shows foley balloon in decompressed bladder. Will treat UA with empiric cipro. Followup with Urology this week.  Patient anxious to go visit wife who was also seen in ED and is being admitted.  BP 111/63  Pulse 59  Temp(Src) 97.8 F (36.6 C) (Oral)  Resp 18  Ht 5\' 9"   (1.753 m)  Wt 170 lb (77.111 kg)  BMI 25.09 kg/m2  SpO2 95%   I personally performed the services described in this documentation, which was scribed in my presence. The recorded information has been reviewed and is accurate.    Ezequiel Essex, MD 10/30/13 [phone]

## 2013-10-29 NOTE — Discharge Instructions (Signed)
Acute Urinary Retention, Male °Acute urinary retention is the temporary inability to urinate. °This is a common problem in older men. As men age their prostates become larger and block the flow of urine from the bladder. This is usually a problem that has come on gradually.  °HOME CARE INSTRUCTIONS °If you are sent home with a Foley catheter and a drainage system, you will need to discuss the best course of action with your health care provider. While the catheter is in, maintain a good intake of fluids. Keep the drainage bag emptied and lower than your catheter. This is so that contaminated urine will not flow back into your bladder, which could lead to a urinary tract infection. °There are two main types of drainage bags. One is a large bag that usually is used at night. It has a good capacity that will allow you to sleep through the night without having to empty it. The second type is called a leg bag. It has a smaller capacity, so it needs to be emptied more frequently. However, the main advantage is that it can be attached by a leg strap and can go underneath your clothing, allowing you the freedom to move about or leave your home. °Only take over-the-counter or prescription medicines for pain, discomfort, or fever as directed by your health care provider.  °SEEK MEDICAL CARE IF: °· You develop a low-grade fever. °· You experience spasms or leakage of urine with the spasms. °SEEK IMMEDIATE MEDICAL CARE IF:  °· You develop chills or fever. °· Your catheter stops draining urine. °· Your catheter falls out. °· You start to develop increased bleeding that does not respond to rest and increased fluid intake. °MAKE SURE YOU: °· Understand these instructions. °· Will watch your condition. °· Will get help right away if you are not doing well or get worse. °Document Released: 12/14/2000 Document Revised: 05/10/2013 Document Reviewed: 02/16/2013 °[employer]® Patient Information ©2014 [employer]. ° °

## 2013-10-29 NOTE — ED Notes (Signed)
Pt c/o burning with urination and having problems with the amount of urination states "I can only pee a little bit" symptoms started 3 days ago,

## 2013-10-30 ENCOUNTER — Emergency Department (HOSPITAL_COMMUNITY)
Admission: EM | Admit: 2013-10-30 | Discharge: 2013-10-30 | Disposition: A | Discharge status: Home / Self Care | Payer: Medicare Other | Attending: Emergency Medicine | Admitting: Emergency Medicine

## 2013-10-30 ENCOUNTER — Encounter (HOSPITAL_COMMUNITY): Payer: Self-pay | Admitting: Emergency Medicine

## 2013-10-30 DIAGNOSIS — N400 Enlarged prostate without lower urinary tract symptoms: Secondary | ICD-10-CM | POA: Insufficient documentation

## 2013-10-30 DIAGNOSIS — T83091A Other mechanical complication of indwelling urethral catheter, initial encounter: Secondary | ICD-10-CM | POA: Insufficient documentation

## 2013-10-30 DIAGNOSIS — Z79899 Other long term (current) drug therapy: Secondary | ICD-10-CM | POA: Insufficient documentation

## 2013-10-30 DIAGNOSIS — Y846 Urinary catheterization as the cause of abnormal reaction of the patient, or of later complication, without mention of misadventure at the time of the procedure: Secondary | ICD-10-CM | POA: Insufficient documentation

## 2013-10-30 DIAGNOSIS — Z860100 Personal history of colon polyps, unspecified: Secondary | ICD-10-CM | POA: Insufficient documentation

## 2013-10-30 DIAGNOSIS — Z880 Allergy status to penicillin: Secondary | ICD-10-CM | POA: Insufficient documentation

## 2013-10-30 DIAGNOSIS — IMO0002 Reserved for concepts with insufficient information to code with codable children: Secondary | ICD-10-CM | POA: Insufficient documentation

## 2013-10-30 DIAGNOSIS — Z85118 Personal history of other malignant neoplasm of bronchus and lung: Secondary | ICD-10-CM | POA: Insufficient documentation

## 2013-10-30 DIAGNOSIS — Z87891 Personal history of nicotine dependence: Secondary | ICD-10-CM | POA: Insufficient documentation

## 2013-10-30 DIAGNOSIS — J029 Acute pharyngitis, unspecified: Secondary | ICD-10-CM | POA: Insufficient documentation

## 2013-10-30 DIAGNOSIS — N184 Chronic kidney disease, stage 4 (severe): Secondary | ICD-10-CM | POA: Insufficient documentation

## 2013-10-30 DIAGNOSIS — T839XXA Unspecified complication of genitourinary prosthetic device, implant and graft, initial encounter: Secondary | ICD-10-CM

## 2013-10-30 NOTE — ED Notes (Signed)
Bladder irrigated with sterile normal saline. No distress with irrigation, tolerated well. Urine returned clear, with some sediment returned.   Patient describes pain as sharp and intermittent, lasting for several minutes at a time. Less frequent than before.

## 2013-10-30 NOTE — Discharge Instructions (Signed)
FOLLOWUP WITH YOUR UROLOGIST AS DISCUSSED RETURN FOR WORSENED PAIN, FEVER OVER 100.4, VOMITING OR SEVERE PAIN IN YOUR LOWER ABDOMEN OR AROUND THE FOLEY CATHETER PLEASE RETURN IF YOU FEEL THAT IT URINE IS NOT FLOWING PROPERLY

## 2013-10-30 NOTE — ED Notes (Signed)
Patient with no complaints at this time. Respirations even and unlabored. Skin warm/dry. Discharge instructions reviewed with patient at this time. Patient given opportunity to voice concerns/ask questions. Patient discharged at this time and left Emergency Department via wheelchair.

## 2013-10-30 NOTE — ED Notes (Signed)
Patient seen here yesterday and had foley cath placed. Patient c/o pain and discomfort. Patient reports large amount of blood in foley bag.

## 2013-10-30 NOTE — ED Provider Notes (Signed)
CSN: [id number]     Arrival date & time 10/30/13  1408 History   First MD Initiated Contact with Patient 10/30/13 1641     Chief Complaint  Patient presents with  . foley cath check       The history is provided by the patient and a relative.   Patient presents with son for concerns that his foley catheter is not in place Patient was seen in the ED on 2/8 and had foley placed for urinary retention He reports after discharge home he has noticed lower abdominal pain and also pain around his penis.  He denies trauma to area.  He reports he feels it is out of place.   No fever/vomiting No new weakness No back pain His course is worsening   Past Medical History  Diagnosis Date  . Lung cancer   . Back pain   . BPH (benign prostatic hyperplasia)   . S/P colonoscopy 2002, 2007    Hyperplastic polyps 2002; pancolonic diverticula 2007  . Hx of adenomatous colonic polyps   . COPD (chronic obstructive pulmonary disease)   . CKD (chronic kidney disease) stage 4, GFR 15-29 ml/min     Creatinine 2.0   Past Surgical History  Procedure Laterality Date  . Hernia surgery x 3      Inguinal hernia repair  . Cholecystectomy    . Appendectomy    . Radiofrequency ablation for lung cancer     Family History  Problem Relation Age of Onset  . CAD Brother     Not premature  . Heart failure Father     Died in his 98s   History  Substance Use Topics  . Smoking status: Former Smoker -- 1.00 packs/day for 60 years    Types: Cigarettes    Quit date: 09/22/2003  . Smokeless tobacco: Never Used     Comment: quit 6 years ago, about 1 ppd X 60+ years  . Alcohol Use: No    Review of Systems  Constitutional: Negative for fever.  Cardiovascular: Negative for chest pain.  Gastrointestinal: Negative for vomiting.  Musculoskeletal: Negative for back pain.  All other systems reviewed and are negative.      Allergies  Penicillins  Home Medications   Current Outpatient Rx  Name  Route  Sig   Dispense  Refill  . ciprofloxacin (CIPRO) 500 MG tablet   Oral   Take 1 tablet (500 mg total) by mouth every 12 (twelve) hours.   10 tablet   0   . donepezil (ARICEPT) 10 MG tablet   Oral   Take 10 mg by mouth daily.         . Fluticasone-Salmeterol (ADVAIR DISKUS) 250-50 MCG/DOSE AEPB   Inhalation   Inhale 1 puff into the lungs every 12 (twelve) hours.          Marland Kitchen HYDROcodone-acetaminophen (NORCO/VICODIN) 5-325 MG per tablet   Oral   Take 1 tablet by mouth every 6 (six) hours as needed. pain         . Tamsulosin HCl (FLOMAX) 0.4 MG CAPS   Oral   Take 0.4 mg by mouth daily.          Marland Kitchen tiotropium (SPIRIVA) 18 MCG inhalation capsule   Inhalation   Place 18 mcg into inhaler and inhale daily.          . pregabalin (LYRICA) 50 MG capsule   Oral   Take 50 mg by mouth every evening.         Marland Kitchen  temazepam (RESTORIL) 15 MG capsule   Oral   Take 15 mg by mouth at bedtime.          BP 134/53  Pulse 57  Temp(Src) 98.2 F (36.8 C) (Oral)  Resp 16  Ht 5\' 9"  (1.753 m)  Wt 170 lb (77.111 kg)  BMI 25.09 kg/m2  SpO2 93% Physical Exam CONSTITUTIONAL: Well developed/well nourished HEAD: Normocephalic/atraumatic EYES: EOMI/PERRL ENMT: Mucous membranes moist NECK: supple no meningeal signs CV: S1/S2 noted, no murmurs/rubs/gallops noted LUNGS: Lungs are clear to auscultation bilaterally, no apparent distress ABDOMEN: soft, nontender, no rebound or guarding YM:EBRAXE present at patient request.  Foley appears in place externally.  There is no signs of trauma or bleeding around penis.  There is bloody urine noted in foley bag. NEURO: Pt is awake/alert, moves all extremitiesx4 EXTREMITIES: pulses normal, full ROM SKIN: warm, color normal PSYCH: no abnormalities of mood noted  ED Course  Procedures  Labs Review Labs Reviewed - No data to display Imaging Review   EKG Interpretation   None       MDM   Final diagnoses:  None    Nursing notes including past  medical history and social history reviewed and considered in documentation Previous records reviewed and considered  5:14 PM Will confirm placement of foley Pt is in no distress at this time 5:52 PM PT IMPROVED AFTER IRRIGATION BY NURSING URINE IS LESS BLOODY IN APPEARANCE NO SIGNS OF RETENTION.  HE APPEARS COMFORTABLE AND SUSPECT FOLEY IS IN CORRECT LOCATION HE HAS UROLOGY F/U IN [place] DISCUSSED STRICT RETURN PRECAUTIONS     Sharyon Cable, MD 10/30/13 [phone]

## 2013-11-25 ENCOUNTER — Encounter (HOSPITAL_COMMUNITY): Payer: Self-pay | Admitting: Emergency Medicine

## 2013-11-25 ENCOUNTER — Emergency Department (INDEPENDENT_AMBULATORY_CARE_PROVIDER_SITE_OTHER)
Admission: EM | Admit: 2013-11-25 | Discharge: 2013-11-25 | Disposition: A | Discharge status: Home / Self Care | Payer: Medicare Other | Source: Home / Self Care | Attending: Family Medicine | Admitting: Family Medicine

## 2013-11-25 DIAGNOSIS — S41111A Laceration without foreign body of right upper arm, initial encounter: Secondary | ICD-10-CM

## 2013-11-25 DIAGNOSIS — S41109A Unspecified open wound of unspecified upper arm, initial encounter: Secondary | ICD-10-CM

## 2013-11-25 DIAGNOSIS — W2203XA Walked into furniture, initial encounter: Secondary | ICD-10-CM

## 2013-11-25 MED ORDER — TETANUS-DIPHTH-ACELL PERTUSSIS 5-2.5-18.5 LF-MCG/0.5 IM SUSP
INTRAMUSCULAR | Status: AC
  Filled 2013-11-25: qty 0.5

## 2013-11-25 MED ORDER — TETANUS-DIPHTH-ACELL PERTUSSIS 5-2.5-18.5 LF-MCG/0.5 IM SUSP
0.5000 mL | Freq: Once | INTRAMUSCULAR | Status: AC
  Administered 2013-11-25: 0.5 mL via INTRAMUSCULAR

## 2013-11-25 NOTE — Discharge Instructions (Signed)
Wash regularly as discussed, return in 2 weeks for staple removal. Use bacitracin ointment twice a day.

## 2013-11-25 NOTE — ED Notes (Signed)
Here with son States was getting something out of the draw and fell Banged up the right arm  This happened about 3 nights ago

## 2013-11-25 NOTE — ED Provider Notes (Addendum)
CSN: [id number]     Arrival date & time 11/25/13  1255 History   First MD Initiated Contact with Patient 11/25/13 1512     Chief Complaint  Patient presents with  . Fall   (Consider location/radiation/quality/duration/timing/severity/associated sxs/prior Treatment) Patient is a 80 y.o. male presenting with fall. The history is provided by the patient, the spouse and a relative.  Fall This is a new problem. The current episode started more than 2 days ago (fell against furniture 3 d ago sustaining skin injury to right arm). The problem has not changed since onset.Pertinent negatives include no chest pain, no abdominal pain and no headaches.    Past Medical History  Diagnosis Date  . Lung cancer   . Back pain   . BPH (benign prostatic hyperplasia)   . S/P colonoscopy 2002, 2007    Hyperplastic polyps 2002; pancolonic diverticula 2007  . Hx of adenomatous colonic polyps   . COPD (chronic obstructive pulmonary disease)   . CKD (chronic kidney disease) stage 4, GFR 15-29 ml/min     Creatinine 2.0   Past Surgical History  Procedure Laterality Date  . Hernia surgery x 3      Inguinal hernia repair  . Cholecystectomy    . Appendectomy    . Radiofrequency ablation for lung cancer     Family History  Problem Relation Age of Onset  . CAD Brother     Not premature  . Heart failure Father     Died in his 29s   History  Substance Use Topics  . Smoking status: Former Smoker -- 1.00 packs/day for 60 years    Types: Cigarettes    Quit date: 09/22/2003  . Smokeless tobacco: Never Used     Comment: quit 6 years ago, about 1 ppd X 60+ years  . Alcohol Use: No    Review of Systems  Constitutional: Negative.   Cardiovascular: Negative for chest pain.  Gastrointestinal: Negative for abdominal pain.  Musculoskeletal: Negative.   Skin: Positive for wound.  Neurological: Negative for headaches.    Allergies  Penicillins  Home Medications   Current Outpatient Rx  Name  Route  Sig   Dispense  Refill  . ciprofloxacin (CIPRO) 500 MG tablet   Oral   Take 1 tablet (500 mg total) by mouth every 12 (twelve) hours.   10 tablet   0   . donepezil (ARICEPT) 10 MG tablet   Oral   Take 10 mg by mouth daily.         . Fluticasone-Salmeterol (ADVAIR DISKUS) 250-50 MCG/DOSE AEPB   Inhalation   Inhale 1 puff into the lungs every 12 (twelve) hours.          Marland Kitchen HYDROcodone-acetaminophen (NORCO/VICODIN) 5-325 MG per tablet   Oral   Take 1 tablet by mouth every 6 (six) hours as needed. pain         . pregabalin (LYRICA) 50 MG capsule   Oral   Take 50 mg by mouth every evening.         . Tamsulosin HCl (FLOMAX) 0.4 MG CAPS   Oral   Take 0.4 mg by mouth daily.          . temazepam (RESTORIL) 15 MG capsule   Oral   Take 15 mg by mouth at bedtime.         Marland Kitchen tiotropium (SPIRIVA) 18 MCG inhalation capsule   Inhalation   Place 18 mcg into inhaler and inhale daily.  BP 136/75  Pulse 87  Temp(Src) 97.7 F (36.5 C) (Oral)  Resp 20  SpO2 100% Physical Exam  Nursing note and vitals reviewed. Constitutional: He is oriented to person, place, and time. He appears well-developed and well-nourished.  Musculoskeletal: Normal range of motion. He exhibits no tenderness.  Neurological: He is alert and oriented to person, place, and time.  Skin: Skin is warm and dry.  Skin flap abrasion to right upper arm and gaping tear lac to right elbow area post ., abrasion debrided, both areas washed with hibiclens.    ED Course  LACERATION REPAIR Date/Time: 11/25/2013 3:38 PM Performed by: Billy Fischer Authorized by: Ihor Gully D Consent: Verbal consent obtained. Consent given by: patient Body area: upper extremity Location details: right elbow Laceration length: 5 cm Foreign bodies: no foreign bodies Tendon involvement: none Nerve involvement: none Vascular damage: no Patient sedated: no Preparation: Patient was prepped and draped in the usual sterile  fashion. Irrigation solution: saline Amount of cleaning: standard Debridement: minimal Degree of undermining: none Skin closure: staples Number of sutures: 1 Approximation: loose Approximation difficulty: simple Dressing: 4x4 sterile gauze Patient tolerance: Patient tolerated the procedure well with no immediate complications.   (including critical care time) Labs Review Labs Reviewed - No data to display Imaging Review No results found.   MDM   1. Lacerations of multiple sites of right arm        Billy Fischer, MD 11/25/13 1543  Billy Fischer, MD 11/25/13 [phone]

## 2013-12-12 ENCOUNTER — Ambulatory Visit (HOSPITAL_COMMUNITY)
Admission: RE | Admit: 2013-12-12 | Discharge: 2013-12-12 | Disposition: A | Discharge status: Home / Self Care | Payer: Medicare Other | Source: Ambulatory Visit | Attending: Pulmonary Disease | Admitting: Pulmonary Disease

## 2013-12-12 DIAGNOSIS — M6281 Muscle weakness (generalized): Secondary | ICD-10-CM | POA: Insufficient documentation

## 2013-12-12 DIAGNOSIS — N184 Chronic kidney disease, stage 4 (severe): Secondary | ICD-10-CM | POA: Insufficient documentation

## 2013-12-12 DIAGNOSIS — J449 Chronic obstructive pulmonary disease, unspecified: Secondary | ICD-10-CM | POA: Insufficient documentation

## 2013-12-12 DIAGNOSIS — IMO0001 Reserved for inherently not codable concepts without codable children: Secondary | ICD-10-CM | POA: Insufficient documentation

## 2013-12-12 DIAGNOSIS — R2689 Other abnormalities of gait and mobility: Secondary | ICD-10-CM

## 2013-12-12 DIAGNOSIS — J4489 Other specified chronic obstructive pulmonary disease: Secondary | ICD-10-CM | POA: Insufficient documentation

## 2013-12-12 DIAGNOSIS — R269 Unspecified abnormalities of gait and mobility: Secondary | ICD-10-CM | POA: Insufficient documentation

## 2013-12-12 NOTE — Evaluation (Addendum)
Physical Therapy Evaluation  Patient Details  Name: Paul Gray MRN: [id number] Date of Birth: [birth year]  Today's Date: 12/12/2013 Time: 7619-5093 PT Time Calculation (min): 45 min Charges: 1 Eval, [id number] Therapeutic exercise              Visit#: 1 of 12  Re-eval: 01/11/14 Assessment Diagnosis: Balance  Prior Therapy: no  Authorization: Medicare     Past Medical History:  Past Medical History  Diagnosis Date  . Lung cancer   . Back pain   . BPH (benign prostatic hyperplasia)   . S/P colonoscopy 2002, 2007    Hyperplastic polyps 2002; pancolonic diverticula 2007  . Hx of adenomatous colonic polyps   . COPD (chronic obstructive pulmonary disease)   . CKD (chronic kidney disease) stage 4, GFR 15-29 ml/min     Creatinine 2.0   Past Surgical History:  Past Surgical History  Procedure Laterality Date  . Hernia surgery x 3      Inguinal hernia repair  . Cholecystectomy    . Appendectomy    . Radiofrequency ablation for lung cancer      Subjective Symptoms/Limitations Symptoms: No NT except minor in feet, Recent limitation in walking.   Pertinent History: recent fall 3 weeks ago, and again 2 weeks before that, prior to that it had been years since the falls. lt LE shorter than Rt LE. History of back pain. No histry of diabetes, CHF or stroke.  Limitations: Standing;Walking How long can you sit comfortably?: no How long can you stand comfortably?: patient can stand for 74minutes,  How long can you walk comfortably?: Patient can walk for 15 minutes Repetition: Increases Symptoms Pain Assessment Currently in Pain?: No/denies Multiple Pain Sites: No  Precautions/Restrictions  Precautions Precautions: Fall  Balance Screening Balance Screen Has the patient fallen in the past 6 months: Yes How many times?: 2 Has the patient had a decrease in activity level because of a fear of falling? : Yes (secvondary to poor whether, so he says)  Prior [place] expects to be discharged to:: Private residence Prior Function Level of Independence: Independent with basic ADLs  Cognition/Observation Cognition Overall Cognitive Status: Within Functional Limits for tasks assessed Observation/Other Assessments Observations: Leg length descrpancy: Lt LE 2.5 cm > Rt Other Assessments: Patient ambulate with obvious trendelenbrg gait and with R hip forward rotated throughout gait.   Sensation/Coordination/Flexibility/Functional Tests Sensation Light Touch: Appears Intact Coordination Gross Motor Movements are Fluid and Coordinated: Yes Flexibility Thomas: Positive Obers: Positive  Assessment    Exercise/Treatments Mobility/Balance  Oceanographer Test Sit to Stand: Able to stand without using hands and stabilize independently Standing Unsupported: Able to stand safely 2 minutes Sitting with Back Unsupported but Feet Supported on Floor or Stool: Able to sit safely and securely 2 minutes Stand to Sit: Sits safely with minimal use of hands Transfers: Able to transfer safely, minor use of hands Standing Unsupported with Eyes Closed: Able to stand 10 seconds safely Standing Ubsupported with Feet Together: Able to place feet together independently and stand 1 minute safely From Standing, Reach Forward with Outstretched Arm: Can reach forward >12 cm safely (5") From Standing Position, Pick up Object from Floor: Able to pick up shoe safely and easily From Standing Position, Turn to Look Behind Over each Shoulder: Looks behind from both sides and weight shifts well Turn 360 Degrees: Able to turn 360 degrees safely in 4 seconds or less Standing Unsupported, Alternately Place Feet on Step/Stool: Able to stand  independently and complete 8 steps >20 seconds Standing Unsupported, One Foot in Front: Able to take small step independently and hold 30 seconds Standing on One Leg: Tries to lift leg/unable to hold 3 seconds but remains standing  independently Total Score: 49   Physical Therapy Assessment and Plan PT Assessment and Plan Clinical Impression Statement: Patient displays decreased balance secondary to gait abnormalities and leg length descrapancy that have been exacerbated by a recent decrease in strength due to patient's sedentary life style. Most specifically Patient ambulates with excessive trendelenberg gait and excessive R hip forward rotation thruogh out gait that results in patient scorring during walking.  Patient states that he has been unable to tolerate a heelp wedge or shoe lifts to correct hip alignment. Patient displays excessive hip abductor weakness that is resulting in trendelenburg gait and difficutly performing single leg balance. Patient will benfit from skilled PT to address above listed impairments, improve strength and stamina, and  decrease risk of fall and potential for futre fracture.  Pt will benefit from skilled therapeutic intervention in order to improve on the following deficits: Abnormal gait;Decreased activity tolerance;Decreased balance;Decreased coordination;Decreased strength;Difficulty walking;Impaired flexibility;Decreased range of motion;Decreased mobility PT Frequency: Min 2X/week PT Duration: 6 weeks PT Plan: Initil focus on increasing hip mobility and strength while focusing on weight shifting in standing to decrease risk of falls: Specifically piiriformis stretch, gastroc strengthening, and  forward and lateral lunges, Squatting, and step ups.     Goals Home Exercise Program Pt/caregiver will Perform Home Exercise Program: For increased ROM;For increased strengthening;Independently;For improved balance PT Short Term Goals Time to Complete Short Term Goals: 3 weeks PT Short Term Goal 1: Patient will improve Berg score to 52/56 indicating patient is at less than 50% chance risk of fall PT Short Term Goal 2: Patient will perform TUG <20 seconds PT Short Term Goal 3: patient will perform   5x sit to stand <30sec  PT Short Term Goal 4: Patient hip IR mobility will improve to >25 degree bilaterally to decrease low back pain.  PT Short Term Goal 5: Patient's hip abduction strength to improve to 4/5 to indicated improving stability PT Long Term Goals Time to Complete Long Term Goals: 8 weeks PT Long Term Goal 1: Patient will improve Berg score to 54/56 indicating patient is at less than 50% chance risk of fall PT Long Term Goal 2: Patient will perform TUG <15 seconds Long Term Goal 3: patient will perform  5x sit to stand <15sec  Long Term Goal 4: Patient's hip abduction strength to improve to 4+/5 to indicated improving stability and will ambulate without a trendelenburg sign to indicate increased strength in hip abductors and increased stability and safety  while walking.  PT Long Term Goal 5: Patient will be abl;e to ambulate >68minute without fear of falling  Problem List Patient Active Problem List   Diagnosis Date Noted  . Balance problem 12/12/2013  . Abnormality of gait 12/12/2013  . Precordial pain 08/12/2012  . COPD (chronic obstructive pulmonary disease) 10/14/2011  . H/O: lung cancer 10/14/2011  . CKD (chronic kidney disease), stage IV 10/14/2011    PT - End of Session Activity Tolerance: Patient tolerated treatment well General Behavior During Therapy: WFL for tasks assessed/performed PT Plan of Care PT Home Exercise Plan: heel and toe raises Consulted and Agree with Plan of Care: Patient  GP Functional Assessment Tool Used: FOTO and BERG Balance score Functional Limitation: Mobility: Walking and moving around Mobility: Walking and Moving Around Current  Status [phone]): At least 60 percent but less than 80 percent impaired, limited or restricted Mobility: Walking and Moving Around Goal Status [phone]): At least 40 percent but less than 60 percent impaired, limited or restricted  Leia Alf 12/12/2013, 5:39 PM  Physician Documentation Your signature is  required to indicate approval of the treatment plan as stated above.  Please sign and either send electronically or make a copy of this report for your files and return this physician signed original.   Please mark one 1.__approve of plan  2. ___approve of plan with the following conditions.   ______________________________                                                          _____________________ Physician Signature                                                                                                             Date

## 2013-12-19 ENCOUNTER — Ambulatory Visit (HOSPITAL_COMMUNITY)
Admission: RE | Admit: 2013-12-19 | Discharge: 2013-12-19 | Disposition: A | Discharge status: Home / Self Care | Payer: Medicare Other | Source: Ambulatory Visit | Attending: Pulmonary Disease | Admitting: Pulmonary Disease

## 2013-12-19 DIAGNOSIS — R269 Unspecified abnormalities of gait and mobility: Secondary | ICD-10-CM

## 2013-12-19 NOTE — Progress Notes (Signed)
Physical Therapy Treatment Patient Details  Name: Paul Gray MRN: [id number] Date of Birth: [birth year]  Today's Date: 12/19/2013 Time: 7893-8101 PT Time Calculation (min): 45 min 1430 -1440 manual  7510 - 2585  TE  Visit#: 2 of 12  Re-eval: 01/11/14 Assessment Diagnosis: Balance  Prior Therapy: no  Authorization: Medicare  Authorization Time Period:    Authorization Visit#: 2 of 10   Subjective: Symptoms/Limitations Symptoms: mild back pain 1-2/10  central, no other new complaints  Pertinent History: recent fall 3 weeks ago, and again 2 weeks before that, prior to that it had been years since the falls. lt LE shorter than Rt LE. History of back pain. No histry of diabetes, CHF or stroke.   Precautions/Restrictions  Precautions Precautions: Fall  Exercise/Treatments    Standing Other Standing Lumbar Exercises: scap retractions 2 x 10 with R leg on 2 in step   Stretches  B Hip Flexor Stretch: 4 reps;20 seconds;Limitations Hip Flexor Stretch Limitations: standing using 6 inch step        Standing Heel Raises: 10 reps Forward Lunges: Both;2 sets;10 reps;Limitations Forward Lunges Limitations: therapist faciliated  Lateral Step Up: Both;2 sets;10 reps;Hand Hold: 2;Step Height: 4" Forward Step Up: Both;10 reps;Step Height: 4";Hand Hold: 1 Other Standing Knee Exercises: SLS 1 cone taps 10 x each leg     Supine   Sidelying Clams: B 2 x 10  Prone      Balance Exercises Standing Tandem Stance: Eyes open;15 secs;2 reps SLS: Eyes open;Hand held assist (HHA) 1;2 reps;20 secs  1 hand held 1 cones taps 10 x each leg     Seated    Supine    Manual Therapy Manual Therapy: Other (comment) Other Manual Therapy: B manual piiformis stretches, PROM B hips Internal and external rotation, manual hamstring stretch x 10 min   Physical Therapy Assessment and Plan PT Assessment and Plan Clinical Impression Statement: patient needed constant verbal and tactile cues to  sequence throught exercises, however reported improved walking ability post session, patient had decreased proprioception throught right leg this visit during luinge work and balance activities . Patient had difficulty following form for lateral lunge.  PT Plan: Initil focus on increasing hip mobility and strength while focusing on weight shifting in standing to decrease risk of falls: Specifically piiriformis stretch, gastroc strengthening, and  forward and lateral lunges, Squatting, and step ups.     Goals PT Short Term Goals Time to Complete Short Term Goals: 3 weeks PT Short Term Goal 1: Patient will improve Berg score to 52/56 indicating patient is at less than 50% chance risk of fall PT Short Term Goal 1 - Progress: Progressing toward goal PT Short Term Goal 2: Patient will perform TUG <20 seconds PT Short Term Goal 2 - Progress: Progressing toward goal PT Short Term Goal 3: patient will perform  5x sit to stand <30sec  PT Short Term Goal 3 - Progress: Progressing toward goal PT Short Term Goal 4: Patient hip IR mobility will improve to >25 degree bilaterally to decrease low back pain.  PT Short Term Goal 4 - Progress: Progressing toward goal PT Short Term Goal 5: Patient's hip abduction strength to improve to 4/5 to indicated improving stability PT Short Term Goal 5 - Progress: Progressing toward goal  Problem List Patient Active Problem List   Diagnosis Date Noted  . Balance problem 12/12/2013  . Abnormality of gait 12/12/2013  . Precordial pain 08/12/2012  . COPD (chronic obstructive pulmonary disease) 10/14/2011  .  H/O: lung cancer 10/14/2011  . CKD (chronic kidney disease), stage IV 10/14/2011       GP    [name] 12/19/2013, 4:22 PM

## 2013-12-22 ENCOUNTER — Ambulatory Visit (HOSPITAL_COMMUNITY)
Admission: RE | Admit: 2013-12-22 | Discharge: 2013-12-22 | Disposition: A | Discharge status: Home / Self Care | Payer: Medicare Other | Source: Ambulatory Visit | Attending: Orthopedic Surgery | Admitting: Orthopedic Surgery

## 2013-12-22 DIAGNOSIS — IMO0001 Reserved for inherently not codable concepts without codable children: Secondary | ICD-10-CM | POA: Insufficient documentation

## 2013-12-22 DIAGNOSIS — M6281 Muscle weakness (generalized): Secondary | ICD-10-CM | POA: Insufficient documentation

## 2013-12-22 DIAGNOSIS — J449 Chronic obstructive pulmonary disease, unspecified: Secondary | ICD-10-CM | POA: Insufficient documentation

## 2013-12-22 DIAGNOSIS — N184 Chronic kidney disease, stage 4 (severe): Secondary | ICD-10-CM | POA: Insufficient documentation

## 2013-12-22 DIAGNOSIS — J4489 Other specified chronic obstructive pulmonary disease: Secondary | ICD-10-CM | POA: Insufficient documentation

## 2013-12-22 DIAGNOSIS — R269 Unspecified abnormalities of gait and mobility: Secondary | ICD-10-CM | POA: Insufficient documentation

## 2013-12-22 NOTE — Progress Notes (Signed)
Physical Therapy Treatment Patient Details  Name: Paul Gray MRN: [id number] Date of Birth: [birth year]  Today's Date: 12/22/2013 Time: 8338-2505 PT Time Calculation (min): 55 min Charge ;TE 3976-7341, NMR [id number]  Visit#: 3 of 12  Re-eval: 01/11/14    Authorization: Medicare  Authorization Time Period:    Authorization Visit#: 3 of 10   Subjective: Symptoms/Limitations Symptoms: No reports of pain today. Pain Assessment Currently in Pain?: No/denies  Objective:   Exercise/Treatments Stretches Piriformis Stretch: 3 reps;30 seconds;Limitations Piriformis Stretch Limitations: Bil LE manual seated  Standing Heel Raises: 15 reps Knee Flexion: Both;10 reps;Limitations Knee Flexion Limitations: therapist facilitation Forward Lunges: Both;2 sets;10 reps;Limitations Forward Lunges Limitations: therapist faciliated  Side Lunges: Both;10 reps;Limitations Side Lunges Limitations: therapist facilitation using 8 in step Lateral Step Up: Both;10 reps;Hand Hold: 1;Step Height: 4" Forward Step Up: Both;10 reps;Step Height: 4";Hand Hold: 1 Step Down: Both;10 reps;Hand Hold: 1;Step Height: 4" Functional Squat: 10 reps SLS: LT 18", Rt 13" max of 3  Balance Exercises Standing Tandem Stance: Eyes open;3 reps;30 secs;Limitations Tandem Stance Limitations: on 6in step, HHA to get on step no HHA during stance Retro Gait: 1 rep Sidestepping: 1 rep   Physical Therapy Assessment and Plan PT Assessment and Plan Clinical Impression Statement: Multimodal cueing including demonstration, verbal and tacitile cueing required for proper sequencing and form throughout exercises.  Pt given general LE strenghtening HEP exercises and able to demonstrate appropriate technique following therapist facilitation for form, encourage to complete HEP with HHA for all exercises to reduce risk of falls.  Progressed balance activitiies with min assistance required for LOB episodes.  Manual techniques complete to  improve Rt hip mobility.  No reports of pain through session.   PT Plan: Continue with current POC for LE strenghtening, balance training and manual techniques to improve hip mobility.specifically Rt piriformis, hamstrings and gastroc mm.  Next session begin standing abduction and hip hikes.    Goals PT Short Term Goals Time to Complete Short Term Goals: 3 weeks PT Short Term Goal 1: Patient will improve Berg score to 52/56 indicating patient is at less than 50% chance risk of fall PT Short Term Goal 1 - Progress: Progressing toward goal PT Short Term Goal 2: Patient will perform TUG <20 seconds PT Short Term Goal 3: patient will perform  5x sit to stand <30sec  PT Short Term Goal 3 - Progress: Progressing toward goal PT Short Term Goal 4: Patient hip IR mobility will improve to >25 degree bilaterally to decrease low back pain.  PT Short Term Goal 4 - Progress: Progressing toward goal PT Short Term Goal 5: Patient's hip abduction strength to improve to 4/5 to indicated improving stability PT Short Term Goal 5 - Progress: Progressing toward goal  Problem List Patient Active Problem List   Diagnosis Date Noted  . Balance problem 12/12/2013  . Abnormality of gait 12/12/2013  . Precordial pain 08/12/2012  . COPD (chronic obstructive pulmonary disease) 10/14/2011  . H/O: lung cancer 10/14/2011  . CKD (chronic kidney disease), stage IV 10/14/2011    PT - End of Session Equipment Utilized During Treatment: Gait belt Activity Tolerance: Patient tolerated treatment well General Behavior During Therapy: [facility] for tasks assessed/performed  GP    Aldona Lento 12/22/2013, 4:16 PM

## 2013-12-26 ENCOUNTER — Ambulatory Visit (HOSPITAL_COMMUNITY)
Admission: RE | Admit: 2013-12-26 | Discharge: 2013-12-26 | Disposition: A | Discharge status: Home / Self Care | Payer: Medicare Other | Source: Ambulatory Visit | Attending: Pulmonary Disease | Admitting: Pulmonary Disease

## 2013-12-26 NOTE — Progress Notes (Signed)
Physical Therapy Treatment Patient Details  Name: Paul Gray MRN: [id number] Date of Birth: 1923-02-12  Today's Date: 12/26/2013 Time: 1430-1520 PT Time Calculation (min): 50 min  Visit#: 4 of 12  Re-eval: 01/11/14 Authorization: Medicare  Authorization Visit#: 4 of 10 Charges:  therex 45'  Subjective: Symptoms/Limitations Symptoms: Pt states he can already tell an improvement in his gait.  States he was a little sore after last visit but no pain. Pain Assessment Currently in Pain?: No/denies   Exercise/Treatments Stretches Passive Hamstring Stretch: 1 rep;60 seconds;Limitations Passive Hamstring Stretch Limitations: bilaterally Hip Flexor Stretch: 2 reps;30 seconds Hip Flexor Stretch Limitations: Lt only supine PROM Piriformis Stretch: 2 reps;30 seconds Piriformis Stretch Limitations: Bil LE supine PROM Standing Heel Raises: 15 reps;Limitations Heel Raises Limitations: toeraises 15 reps Knee Flexion: Both;10 reps;Limitations Knee Flexion Limitations: therapist facilitation Forward Lunges: Both;2 sets;10 reps;Limitations Forward Lunges Limitations: therapist faciliated  Lateral Step Up: Both;10 reps;Hand Hold: 1;Step Height: 4" Forward Step Up: Both;10 reps;Step Height: 4";Hand Hold: 1 Step Down: Both;10 reps;Hand Hold: 1;Step Height: 4" SLS: LT 18", Rt 10" max of 3 no UE's Other Standing Knee Exercises: hip hikes bilaterally with therapist facilitation 10 reps each Other Standing Knee Exercises: standing hip abduction attempted, however too weak to perform in correct form Supine Bridges: 10 reps Sidelying Hip ABduction: 10 reps;AAROM Clams: B 2 x 10      Physical Therapy Assessment and Plan PT Assessment and Plan Clinical Impression Statement: Pt continues to have difficulty estabilishing and maintaining proper form with all standing therex.  Requires therapist facilitation with all activites to isolate correct muscles.  Unable to perform hip abduction correctly  in standing; transitioned to sidelying to increase amount of therapist assist to isolate hip abductor.  Added bridging in supine and clams to further increase hip abductor and glute strength.  Noted atrophy of muscles in thighs bilaterally.  Noted instability in gait with tendency to reach for objects; recommended patient use SPC until balance improves.  Pt verbalized understanding, however unsure if will use cane.   PT Plan: Attempt gait training with SPC to increase safety.  Add standing gastroc stretch.      Problem List Patient Active Problem List   Diagnosis Date Noted  . Balance problem 12/12/2013  . Abnormality of gait 12/12/2013  . Precordial pain 08/12/2012  . COPD (chronic obstructive pulmonary disease) 10/14/2011  . H/O: lung cancer 10/14/2011  . CKD (chronic kidney disease), stage IV 10/14/2011    PT - End of Session Equipment Utilized During Treatment: Gait belt Activity Tolerance: Patient tolerated treatment well General Behavior During Therapy: [facility] for tasks assessed/performed   Teena Irani, PTA/CLT 12/26/2013, 3:50 PM

## 2013-12-29 ENCOUNTER — Ambulatory Visit (HOSPITAL_COMMUNITY)
Admission: RE | Admit: 2013-12-29 | Discharge: 2013-12-29 | Disposition: A | Discharge status: Home / Self Care | Payer: Medicare Other | Source: Ambulatory Visit | Attending: Pulmonary Disease | Admitting: Pulmonary Disease

## 2013-12-29 DIAGNOSIS — R269 Unspecified abnormalities of gait and mobility: Secondary | ICD-10-CM

## 2013-12-29 NOTE — Progress Notes (Signed)
Physical Therapy Treatment Patient Details  Name: Paul Gray MRN: [id number] Date of Birth: 1923/07/05  Today's Date: 12/29/2013 Time: 5449-2010 PT Time Calculation (min): 47 min Charge:  There ex [id number]. Visit#: 5 of 12  Re-eval: 01/11/14    Authorization: Medicare  Subjective: Symptoms/Limitations Symptoms: Pt states he has been trying to do his exercises.     Exercise/Treatments     Aerobic Stationary Bike: nustep L2 x 8'   Standing Heel Raises: 10 reps Lateral Step Up: 10 reps Forward Step Up: 10 reps Functional Squat: 10 reps SLS: B x 5  Gait Training: gt with SPC Other Standing Knee Exercises: marching x 10  Seated Long Arc Quad: Strengthening;Both;10 reps;Weights Long Arc Quad Weight: 5 lbs. Supine Bridges: 10 reps Sidelying Hip ABduction: Both;10 reps Hip ADduction: Both;10 reps Other Sidelying Knee Exercises: sit to stand x 10 Gt trained with [place] pt has improved safety with cane. Urged pt to use his cane at home.         Physical Therapy Assessment and Plan PT Assessment and Plan Clinical Impression Statement: Pt needed therapist facilitation with all exercises.  Attempted isometric abduction while standing against a wall but pt was unable to understand this concept. PT Plan: Begin gastroc stretch.      Goals  progressing  Problem List Patient Active Problem List   Diagnosis Date Noted  . Balance problem 12/12/2013  . Abnormality of gait 12/12/2013  . Precordial pain 08/12/2012  . COPD (chronic obstructive pulmonary disease) 10/14/2011  . H/O: lung cancer 10/14/2011  . CKD (chronic kidney disease), stage IV 10/14/2011       GP    Leeroy Cha 12/29/2013, 4:04 PM

## 2014-01-02 ENCOUNTER — Ambulatory Visit (HOSPITAL_COMMUNITY): Payer: Medicare Other

## 2014-01-03 ENCOUNTER — Telehealth (HOSPITAL_COMMUNITY): Payer: Self-pay

## 2014-01-04 ENCOUNTER — Ambulatory Visit (HOSPITAL_COMMUNITY): Payer: Medicare Other

## 2014-01-09 ENCOUNTER — Ambulatory Visit (HOSPITAL_COMMUNITY)
Admission: RE | Admit: 2014-01-09 | Discharge: 2014-01-09 | Disposition: A | Discharge status: Home / Self Care | Payer: Medicare Other | Source: Ambulatory Visit | Attending: Pulmonary Disease | Admitting: Pulmonary Disease

## 2014-01-09 NOTE — Progress Notes (Signed)
Physical Therapy Treatment Patient Details  Name: Paul Gray MRN: [id number] Date of Birth: 1923-06-05  Today's Date: 01/09/2014 Time: 1515-1600 PT Time Calculation (min): 45 min Charges: Therex x 33' [phone]) NMR x 10' (1550-1600)   Visit#: 6 of 12  Re-eval: 01/11/14  Authorization: Medicare  Authorization Visit#: 6 of 10   Subjective: Symptoms/Limitations Symptoms: Pt states that he is having some back pain today.  Pain Assessment Currently in Pain?: Yes Pain Score: 5  Pain Location: Back   Exercise/Treatments  Stretches Gastroc Stretch: 2 reps;30 seconds;Limitations Gastroc Stretch Limitations: On slantboard Aerobic Stationary Bike: NuStep L2 x 10 min Standing Knee Flexion: Both;10 reps;Limitations Knee Flexion Limitations: therapist facilitation Lateral Step Up: Both;Step Height: 4";Hand Hold: 2;10 reps Forward Step Up: Both;1 set;10 reps;Hand Hold: 2;Step Height: 4" Functional Squat: 10 reps   Balance Exercises Standing SLS: Eyes open;Hand held assist (HHA) 2;2 reps;30 secs Heel Raises: Both;10 reps   Physical Therapy Assessment and Plan PT Assessment and Plan Clinical Impression Statement: Pt requires significant multimodal cueing and demo to complete therex well. Pt displays LE weakness throughout exercises. Noted LE muscle atrophy. Pt was educated on the importance of complying with LE strengthening HEP to improve strength at home so PT can progress. Pt stated that he does feel better when he is walking with the cane and the importance of using the cane during gait was expressed. Began gastroc stretch on slant board to decrease tightness of B gastrocnemius muscles. Tandem gait was initiated to improve balance during gait. Pt requires multimodal cueing for form and technique throughout exercises. Pt reported back pain at 4/10 after the session. This entire session was guided, instructed, and directly supervised by Rachelle Hora, PTA. PT Plan: Continue LE  strengthening per PT POC to increase functional strength.     Problem List Patient Active Problem List   Diagnosis Date Noted  . Balance problem 12/12/2013  . Abnormality of gait 12/12/2013  . Precordial pain 08/12/2012  . COPD (chronic obstructive pulmonary disease) 10/14/2011  . H/O: lung cancer 10/14/2011  . CKD (chronic kidney disease), stage IV 10/14/2011    PT - End of Session Equipment Utilized During Treatment: Gait belt Activity Tolerance: Patient tolerated treatment well General Behavior During Therapy: [facility] for tasks assessed/performed   Ahmed Prima, SPTA Rachelle Hora, PTA 01/09/2014, 4:16 PM

## 2014-01-12 ENCOUNTER — Ambulatory Visit (HOSPITAL_COMMUNITY)
Admission: RE | Admit: 2014-01-12 | Discharge: 2014-01-12 | Disposition: A | Discharge status: Home / Self Care | Payer: Medicare Other | Source: Ambulatory Visit | Attending: Physical Therapy | Admitting: Physical Therapy

## 2014-01-12 DIAGNOSIS — R269 Unspecified abnormalities of gait and mobility: Secondary | ICD-10-CM

## 2014-01-12 NOTE — Progress Notes (Signed)
Physical Therapy Treatment Patient Details  Name: Paul Gray MRN: [id number] Date of Birth: 06-May-1923  Today's Date: 01/12/2014 Time: 2778-2423 PT Time Calculation (min): 45 min  TherEx:  5361-4431, Gait Training: 5400-8676 Visit#: 7 of 12  Re-eval: 01/16/14 Assessment Diagnosis: Balance  Prior Therapy: no  Authorization: Medicare  Authorization Time Period:    Authorization Visit#: 7 of 10   Subjective: Symptoms/Limitations Symptoms: Pt states that he is having some back pain today. Notes that he has otherwise been feeling better.  Pertinent History: recent fall 3 weeks ago, and again 2 weeks before that, prior to that it had been years since the falls. lt LE shorter than Rt LE. History of back pain. No histry of diabetes, CHF or stroke.  Pain Assessment Currently in Pain?: Yes Pain Score: 4  Pain Location: Back Pain Orientation: Lower;Medial  Precautions/Restrictions  Precautions Precautions: Fall  Exercise/Treatments Stretches   calf stretch 3x 30seconds Aerobic Stationary Bike: NuStep L4 x 10 min Standing Forward Lunges: 10 reps;Both Forward Lunges Limitations: to BOSU in parrallel bars Forward Step Up: 10 reps;Both (onto bosu in parallel bars) Rocker Board: 2 minutes (antrior posterior and lateral ) Other Standing Knee Exercises: Hurdles: 30x side stepping with min assit for balance and safety.  Other Standing Knee Exercises: wide walking (unable to perform anterior lateral lunge walks due to limited balance.    Physical Therapy Assessment and Plan PT Assessment and Plan Clinical Impression Statement: Pt requires significant multimodal cueing and demo to complete therex well. This therapist suspects possible cognitive impairments as patient occasionally displays a flat affect and has significant difficulty  following  greater than  single step  commands. Pt displays LE weakness throughout exercises. Noted LE muscle atrophy. Pt was educated on the importance of  complying with LE strengthening HEP to improve strength at home so PT can progress. Pt stated that he does feel better when he is walking with the cane and was expressed multiple times. Continued gastroc stretch on slant board to decrease tightness of B gastrocnemius muscles. Patient responded well to balance exercises when performed in rhythmic fashion resulting in improved weight shift from one leg to the next during gait resulting in improved gait. Pt will benefit from skilled therapeutic intervention in order to improve on the following deficits: Abnormal gait;Decreased activity tolerance;Decreased balance;Decreased coordination;Decreased strength;Difficulty walking;Impaired flexibility;Decreased range of motion;Decreased mobility PT Plan: Continue LE strengthening per PT POC to increase functional strength. Perform reassessment next session.     Goals PT Short Term Goals PT Short Term Goal 1: Patient will improve Berg score to 52/56 indicating patient is at less than 50% chance risk of fall PT Short Term Goal 1 - Progress: Progressing toward goal PT Short Term Goal 2: Patient will perform TUG <20 seconds PT Short Term Goal 2 - Progress: Progressing toward goal PT Short Term Goal 3: patient will perform  5x sit to stand <30sec  PT Short Term Goal 3 - Progress: Progressing toward goal PT Short Term Goal 4: Patient hip IR mobility will improve to >25 degree bilaterally to decrease low back pain.  PT Short Term Goal 4 - Progress: Progressing toward goal PT Short Term Goal 5: Patient's hip abduction strength to improve to 4/5 to indicated improving stability PT Short Term Goal 5 - Progress: Progressing toward goal PT Long Term Goals PT Long Term Goal 1: Patient will improve Berg score to 54/56 indicating patient is at less than 50% chance risk of fall PT Long Term Goal 1 -  Progress: Progressing toward goal PT Long Term Goal 2: Patient will perform TUG <15 seconds PT Long Term Goal 2 - Progress:  Progressing toward goal Long Term Goal 3: patient will perform  5x sit to stand <15sec  Long Term Goal 3 Progress: Progressing toward goal Long Term Goal 4: Patient's hip abduction strength to improve to 4+/5 to indicated improving stability and will ambulate without a trendelenburg sign to indicate increased strength in hip abductors and increased stability and safety  while walking.  Long Term Goal 4 Progress: Progressing toward goal PT Long Term Goal 5: Patient will be abl;e to ambulate >78minute without fear of falling Long Term Goal 5 Progress: Progressing toward goal  Problem List Patient Active Problem List   Diagnosis Date Noted  . Balance problem 12/12/2013  . Abnormality of gait 12/12/2013  . Precordial pain 08/12/2012  . COPD (chronic obstructive pulmonary disease) 10/14/2011  . H/O: lung cancer 10/14/2011  . CKD (chronic kidney disease), stage IV 10/14/2011    PT - End of Session Equipment Utilized During Treatment: Gait belt Activity Tolerance: Patient tolerated treatment well General Behavior During Therapy: [facility] for tasks assessed/performed  GP    Luria Rosario R Darianna Amy PT DPT 01/12/2014, 7:12 PM

## 2014-01-16 ENCOUNTER — Ambulatory Visit (HOSPITAL_COMMUNITY)
Admission: RE | Admit: 2014-01-16 | Discharge: 2014-01-16 | Disposition: A | Discharge status: Home / Self Care | Payer: Medicare Other | Source: Ambulatory Visit | Attending: Pulmonary Disease | Admitting: Pulmonary Disease

## 2014-01-16 DIAGNOSIS — R269 Unspecified abnormalities of gait and mobility: Secondary | ICD-10-CM

## 2014-01-16 NOTE — Evaluation (Signed)
Physical Therapy Re-assessment  Patient Details  Name: Paul Gray MRN: [id number] Date of Birth: 1923/08/26  Today's Date: 01/16/2014 Time: 1520-1605 PT Time Calculation (min): 45 min   Charges: Manual therapy: 9480-1655, TherEx 3748-2707, Gait Training 8675- 4492            Visit#: 8 of 12  Re-eval: 01/16/14    Authorization: Medicare    Authorization Time Period:    Authorization Visit#: 8 of 10   Past Medical History:  Past Medical History  Diagnosis Date  . Lung cancer   . Back pain   . BPH (benign prostatic hyperplasia)   . S/P colonoscopy 2002, 2007    Hyperplastic polyps 2002; pancolonic diverticula 2007  . Hx of adenomatous colonic polyps   . COPD (chronic obstructive pulmonary disease)   . CKD (chronic kidney disease) stage 4, GFR 15-29 ml/min     Creatinine 2.0   Past Surgical History:  Past Surgical History  Procedure Laterality Date  . Hernia surgery x 3      Inguinal hernia repair  . Cholecystectomy    . Appendectomy    . Radiofrequency ablation for lung cancer      Subjective Symptoms/Limitations Symptoms: Pt states that he has continued to have some minor low back pain. Notes that he has otherwise been feeling better.  Limitations: Standing;Walking How long can you sit comfortably?: no  Precautions/Restrictions  Precautions Precautions: Fall  Balance Screening    Prior Functioning     Cognition/Observation Cognition Overall Cognitive Status: Within Functional Limits for tasks assessed Observation/Other Assessments Observations: Leg length descrpancy: Lt LE 2.5 cm > Rt Other Assessments: Patient ambulate with obvious trendelenbrg gait and with R hip forward rotated throughout gait.   Sensation/Coordination/Flexibility/Functional Tests Functional Tests Functional Tests: 5x sit to stand 23seconds Functional Tests: Timed up and Go: 28 seconds  Assessment    Exercise/Treatments Mobility/Balance  Berg Balance Test Sit to Stand: Able  to stand without using hands and stabilize independently Standing Unsupported: Able to stand safely 2 minutes Sitting with Back Unsupported but Feet Supported on Floor or Stool: Able to sit safely and securely 2 minutes Stand to Sit: Sits safely with minimal use of hands Transfers: Able to transfer safely, minor use of hands Standing Unsupported with Eyes Closed: Able to stand 10 seconds safely Standing Ubsupported with Feet Together: Able to place feet together independently and stand 1 minute safely From Standing, Reach Forward with Outstretched Arm: Can reach confidently >25 cm (10") From Standing Position, Pick up Object from Floor: Able to pick up shoe safely and easily From Standing Position, Turn to Look Behind Over each Shoulder: Looks behind from both sides and weight shifts well Turn 360 Degrees: Able to turn 360 degrees safely in 4 seconds or less Standing Unsupported, Alternately Place Feet on Step/Stool: Able to stand independently and complete 8 steps >20 seconds Standing Unsupported, One Foot in Front: Able to take small step independently and hold 30 seconds Standing on One Leg: Tries to lift leg/unable to hold 3 seconds but remains standing independently Total Score: 50    Standing Exercises Forward Lunges: 5 reps;Both Side Lunges: Both;5 reps Other Standing Knee Exercises: neutral stance Frontal plane hip excursions 10x each w ith therapist manual assist to increase Rt hip weight shift.       split stance frontal plane hip excursions 10x each w ith therapist manual assist to increase Rt hip weight shift.       split stance to 6"  frontal  plane hip excursions10x each w ith therapist manual assist to increase Rt hip weight shift.  Other Standing Knee Exercises: Walking frontal plane hip excursion 61ft. 2 way groin stretch 10x 3sec.  Supine Bridges: 10 reps  Manual Therapy Manual Therapy: Joint mobilization Joint Mobilization: muscle energy technique to improve hip  alignment (initially Rt innominate anterior tilit, Lt innominate posteriorly tilted  Physical Therapy Assessment and Plan PT Assessment and Plan Clinical Impression Statement: Pt requires significant multimodal cueing and demo to complete therex well. This therapist suspects possible cognitive impairments as patient occasionally displays a flat affect and has significant difficulty following greater than single step commands. Pt displays LE weakness throughout exercises. Noted LE muscle atrophy. Pt was educated on the importance of complying with LE strengthening HEP to improve strength at home so PT can progress. Patient displayed hip and leg length misalignment initially resulting in severe trendelenberg gait on Left. Following correction of hip alignment and improvement in leg length discrepancy gait improved (hip misalignment exacerbated leg length discrepancy). Patient then performed therEx with focus on improving frontal plane hip mobility resulting in improved gait and decreased scissoring of legs during ambulation.  Pt will benefit from skilled therapeutic intervention in order to improve on the following deficits: Abnormal gait;Decreased activity tolerance;Decreased balance;Decreased coordination;Decreased strength;Difficulty walking;Impaired flexibility;Decreased range of motion;Decreased mobility PT Frequency: Min 2X/week PT Duration: 6 weeks PT Plan: Continue LE strengthenigng, balancing, and dynaminc gait exercises to decrease risk of falls, improve gait efficiency, and and improve walking tolerance. Patient contineus to require frontal plane hip mobility/stability exercises as performed this session to  mdecrease scissoring gait and improve trendelenberg    Goals PT Short Term Goals PT Short Term Goal 1: Patient will improve Berg score to 52/56 indicating patient is at less than 50% chance risk of fall PT Short Term Goal 1 - Progress: Progressing toward goal PT Short Term Goal 2: Patient  will perform TUG <20 seconds PT Short Term Goal 2 - Progress: Progressing toward goal PT Short Term Goal 3: patient will perform  5x sit to stand <30sec  PT Short Term Goal 3 - Progress: Progressing toward goal PT Short Term Goal 4 - Progress: Progressing toward goal PT Short Term Goal 5 - Progress: Progressing toward goal PT Long Term Goals PT Long Term Goal 1 - Progress: Progressing toward goal PT Long Term Goal 2 - Progress: Progressing toward goal Long Term Goal 3 Progress: Progressing toward goal Long Term Goal 4 Progress: Progressing toward goal Long Term Goal 5 Progress: Progressing toward goal  Problem List Patient Active Problem List   Diagnosis Date Noted  . Balance problem 12/12/2013  . Abnormality of gait 12/12/2013  . Precordial pain 08/12/2012  . COPD (chronic obstructive pulmonary disease) 10/14/2011  . H/O: lung cancer 10/14/2011  . CKD (chronic kidney disease), stage IV 10/14/2011    PT - End of Session Equipment Utilized During Treatment: Gait belt Activity Tolerance: Patient tolerated treatment well General Behavior During Therapy: WFL for tasks assessed/performed PT Plan of Care PT Home Exercise Plan: heel and toe raises, frontal plane hip excursions.   GP Functional Assessment Tool Used: FOTO and BERG Balance score Functional Limitation: Mobility: Walking and moving around Mobility: Walking and Moving Around Current Status ([id number]): At least 60 percent but less than 80 percent impaired, limited or restricted Mobility: Walking and Moving Around Goal Status [phone]): At least 40 percent but less than 60 percent impaired, limited or restricted  Leia Alf 01/16/2014, 4:29 PM  Physician  Documentation Your signature is required to indicate approval of the treatment plan as stated above.  Please sign and either send electronically or make a copy of this report for your files and return this physician signed original.   Please mark one 1.__approve of plan  2.  ___approve of plan with the following conditions.   ______________________________                                                          _____________________ Physician Signature                                                                                                             Date

## 2014-01-19 ENCOUNTER — Ambulatory Visit (HOSPITAL_COMMUNITY)
Admission: RE | Admit: 2014-01-19 | Discharge: 2014-01-19 | Disposition: A | Discharge status: Home / Self Care | Payer: Medicare Other | Source: Ambulatory Visit | Attending: Pulmonary Disease | Admitting: Pulmonary Disease

## 2014-01-19 DIAGNOSIS — IMO0001 Reserved for inherently not codable concepts without codable children: Secondary | ICD-10-CM | POA: Insufficient documentation

## 2014-01-19 DIAGNOSIS — R269 Unspecified abnormalities of gait and mobility: Secondary | ICD-10-CM | POA: Insufficient documentation

## 2014-01-19 DIAGNOSIS — J4489 Other specified chronic obstructive pulmonary disease: Secondary | ICD-10-CM | POA: Insufficient documentation

## 2014-01-19 DIAGNOSIS — J449 Chronic obstructive pulmonary disease, unspecified: Secondary | ICD-10-CM | POA: Insufficient documentation

## 2014-01-19 DIAGNOSIS — M6281 Muscle weakness (generalized): Secondary | ICD-10-CM | POA: Insufficient documentation

## 2014-01-19 DIAGNOSIS — N184 Chronic kidney disease, stage 4 (severe): Secondary | ICD-10-CM | POA: Insufficient documentation

## 2014-01-19 NOTE — Progress Notes (Signed)
Physical Therapy Treatment Patient Details  Name: Paul Gray MRN: [id number] Date of Birth: 09/15/1923  Today's Date: 01/19/2014 Time: 1300-1345 PT Time Calculation (min): 45 min  Charges: TherEx 1300-1320, TherAct [id number] Visit#: 9 of 12  Re-eval: 02/18/14 Assessment Diagnosis: Balance  Prior Therapy: no  Authorization: Medicare  Authorization Visit#: 9 of 10   Subjective:    Precautions/Restrictions  Precautions Precautions: Fall  Exercise/Treatments Mobility/Balance        Stretches Gastroc Stretch: 2 reps;Limitations;1 rep Press photographer Limitations: on slant board 3 ways Aerobic Stationary Bike: NuStep L4 x 10 min Machines for Strengthening   Plyometrics   Standing Forward Lunges: 5 reps;Both Side Lunges: Both;5 reps;3 sets Lateral Step Up: Step Height: 8";Step Height: 6";2 sets;10 reps (with mod assist) Forward Step Up: Step Height: 6";5 reps Functional Squat: 5 reps (throughout session. ) Other Standing Knee Exercises: neutral stance Frontal plane hip excursions, split stance frontal plane hip excursions, split stance to 6"  frontal plane hip excursions. All 10x each w ith therapist manual assist to increase Rt hip weight shift.  Other Standing Knee Exercises: 3D hip excursions.     Physical Therapy Assessment and Plan PT Assessment and Plan Clinical Impression Statement: Patient contineus to be suspected of likely cognitive impairments as patient requires extreme/maximal multimodal cueing and has shown increased forgetfulness lately with decreased utilization of hearing aides despite near inability to hear without them and walking withotu gait despite recognizing the need to use a cane  regularly due to quickness to fatigue without one. Patient is unable to follow more that one command at a time and often struggles with followign just one command. Number of activities performed in therapy limited due to inability to follow directions with multiple trials.  Patient has been responding well to therapy with improved gait and decreased trendelenberg, but therpist  doubts patient is  or could perform HEP independently due to difficulty  followign directions and risk of fall so HEP was not progressed this session.  Pt will benefit from skilled therapeutic intervention in order to improve on the following deficits: Abnormal gait;Decreased activity tolerance;Decreased balance;Decreased coordination;Decreased strength;Difficulty walking;Impaired flexibility;Decreased range of motion;Decreased mobility PT Plan: Continue LE strengthenigng, balancing, and dynaminc gait exercises to decrease risk of falls, improve gait efficiency, and and improve walking tolerance. Patient contineus to require frontal plane hip mobility/stability exercises as performed this session to  mdecrease scissoring gait and improve trendelenberg    Problem List Patient Active Problem List   Diagnosis Date Noted  . Balance problem 12/12/2013  . Abnormality of gait 12/12/2013  . Precordial pain 08/12/2012  . COPD (chronic obstructive pulmonary disease) 10/14/2011  . H/O: lung cancer 10/14/2011  . CKD (chronic kidney disease), stage IV 10/14/2011    PT - End of Session Equipment Utilized During Treatment: Gait belt Activity Tolerance: Patient tolerated treatment well General Behavior During Therapy: [facility] for tasks assessed/performed  GP    Paul Gray 01/19/2014, 7:43 PM

## 2014-01-23 ENCOUNTER — Ambulatory Visit (HOSPITAL_COMMUNITY)
Admission: RE | Admit: 2014-01-23 | Discharge: 2014-01-23 | Disposition: A | Discharge status: Home / Self Care | Payer: Medicare Other | Source: Ambulatory Visit | Attending: Pulmonary Disease | Admitting: Pulmonary Disease

## 2014-01-23 DIAGNOSIS — R269 Unspecified abnormalities of gait and mobility: Secondary | ICD-10-CM

## 2014-01-23 NOTE — Progress Notes (Signed)
Physical Therapy Treatment Patient Details  Name: Paul Gray MRN: [id number] Date of Birth: 27-Jun-1923  Today's Date: 01/23/2014 Time: 3716-9678 PT Time Calculation (min): 52 min Charge: NMR 1528-1555, TE 9381-0175  Visit#: 10 of 12  Re-eval: 02/18/14 Assessment Diagnosis: Balance  Prior Therapy: no  Authorization: Medicare  Authorization Time Period: FOTO complete 10th visit  Authorization Visit#: 10 of 20   Subjective: Symptoms/Limitations Symptoms: Pt feels his balance is improving, walking is getting easier still has difficulty walking to mail box.   Pain Assessment Currently in Pain?: No/denies  Precautions/Restrictions  Precautions Precautions: Fall  Exercise/Treatments Stretches Gastroc Stretch: 3 reps;30 seconds;Limitations Press photographer Limitations: on slant board 3 ways Aerobic Stationary Bike: NuStep L4 x 10 min Standing Forward Lunges: Both;10 reps;Limitations Side Lunges: Both;10 reps;3 seconds Lateral Step Up: Both;Step Height: 6";10 reps;Limitations Lateral Step Up Limitations: multimodal cueing for technique and posture Forward Step Up: Both;10 reps;Step Height: 6";Hand Hold: 2 Functional Squat: 5 reps;Limitations Functional Squat Limitations: manual facilitation  Rocker Board: 2 minutes (R/L to improve weight distrubution) Other Standing Knee Exercises: sidestepping and Retro 2RT Other Standing Knee Exercises: 3D hip excursions; trial with hip hikes- pt unable to understand technique    Physical Therapy Assessment and Plan PT Assessment and Plan Clinical Impression Statement: Pt continues to be suspected of cognitive impairements as he requires extreme multimodal cueing for all activities with increased forgetfulness directly following demonstration and verbal cueing explaining purpose and technique with all exercises.  Session focus on functional strengthening exercises for glut strengthening and to improve hip mobilty to improve gait mechanics  and reduce trendelenberg gait.  PT Plan: Continue LE strengthenigng, balancing, and dynaminc gait exercises to decrease risk of falls, improve gait efficiency, and and improve walking tolerance. Patient continues to require frontal plane hip mobility/stability exercises as performed this session to decrease scissoring gait and improve trendelenberg    Goals PT Short Term Goals PT Short Term Goal 1: Patient will improve Berg score to 52/56 indicating patient is at less than 50% chance risk of fall PT Short Term Goal 1 - Progress: Progressing toward goal PT Short Term Goal 2: Patient will perform TUG <20 seconds PT Short Term Goal 3: patient will perform  5x sit to stand <30sec  PT Short Term Goal 4: Patient hip IR mobility will improve to >25 degree bilaterally to decrease low back pain.  PT Short Term Goal 4 - Progress: Progressing toward goal PT Short Term Goal 5: Patient's hip abduction strength to improve to 4/5 to indicated improving stability PT Short Term Goal 5 - Progress: Progressing toward goal PT Long Term Goals PT Long Term Goal 1: Patient will improve Berg score to 54/56 indicating patient is at less than 50% chance risk of fall PT Long Term Goal 2: Patient will perform TUG <15 seconds Long Term Goal 3: patient will perform  5x sit to stand <15sec  Long Term Goal 4: Patient's hip abduction strength to improve to 4+/5 to indicated improving stability and will ambulate without a trendelenburg sign to indicate increased strength in hip abductors and increased stability and safety  while walking.  PT Long Term Goal 5: Patient will be abl;e to ambulate >26minute without fear of falling  Problem List Patient Active Problem List   Diagnosis Date Noted  . Balance problem 12/12/2013  . Abnormality of gait 12/12/2013  . Precordial pain 08/12/2012  . COPD (chronic obstructive pulmonary disease) 10/14/2011  . H/O: lung cancer 10/14/2011  . CKD (chronic kidney disease),  stage IV 10/14/2011     PT - End of Session Equipment Utilized During Treatment: Gait belt Activity Tolerance: Patient tolerated treatment well General Behavior During Therapy: WFL for tasks assessed/performed  GP Functional Assessment Tool Used: (Based on FOTO and clinical judgement this sessoin) FOTO and BERG Balance score Functional Limitation: Mobility: Walking and moving around Mobility: Walking and Moving Around Current Status [phone]): At least 60 percent but less than 80 percent impaired, limited or restricted Mobility: Walking and Moving Around Goal Status [phone]): At least 40 percent but less than 60 percent impaired, limited or restricted  Aldona Lento 01/23/2014, 4:52 PM

## 2014-01-24 NOTE — Progress Notes (Signed)
Physical Therapy Treatment Patient Details  Name: Paul Gray MRN: [id number] Date of Birth: 02-02-1923  Today's Date: 01/23/2014 Time: 8469-6295 PT Time Calculation (min): 52 min Charge: NMR 1528-1555, TE 2841-3244  Visit#: 10 of 12  Re-eval: 02/18/14 Assessment Diagnosis: Balance  Prior Therapy: no  Authorization: Medicare  Authorization Time Period: FOTO complete 10th visit  Authorization Visit#: 10 of 20   Subjective: Symptoms/Limitations Symptoms: Pt feels his balance is improving, walking is getting easier still has difficulty walking to mail box.   Pain Assessment Currently in Pain?: No/denies  Precautions/Restrictions  Precautions Precautions: Fall  Exercise/Treatments Stretches Gastroc Stretch: 3 reps;30 seconds;Limitations Press photographer Limitations: on slant board 3 ways Aerobic Stationary Bike: NuStep L4 x 10 min Standing Forward Lunges: Both;10 reps;Limitations Side Lunges: Both;10 reps;3 seconds Lateral Step Up: Both;Step Height: 6";10 reps;Limitations Lateral Step Up Limitations: multimodal cueing for technique and posture Forward Step Up: Both;10 reps;Step Height: 6";Hand Hold: 2 Functional Squat: 5 reps;Limitations Functional Squat Limitations: manual facilitation  Rocker Board: 2 minutes (R/L to improve weight distrubution) Other Standing Knee Exercises: sidestepping and Retro 2RT Other Standing Knee Exercises: 3D hip excursions; trial with hip hikes- pt unable to understand technique    Physical Therapy Assessment and Plan PT Assessment and Plan Clinical Impression Statement: Pt continues to be suspected of cognitive impairements as he requires extreme multimodal cueing for all activities with increased forgetfulness directly following demonstration and verbal cueing explaining purpose and technique with all exercises.  Session focus on functional strengthening exercises for glut strengthening and to improve hip mobilty to improve gait mechanics  and reduce trendelenberg gait.  PT Plan: Continue LE strengthenigng, balancing, and dynaminc gait exercises to decrease risk of falls, improve gait efficiency, and and improve walking tolerance. Patient continues to require frontal plane hip mobility/stability exercises as performed this session to decrease scissoring gait and improve trendelenberg    Goals PT Short Term Goals PT Short Term Goal 1: Patient will improve Berg score to 52/56 indicating patient is at less than 50% chance risk of fall PT Short Term Goal 1 - Progress: Progressing toward goal PT Short Term Goal 2: Patient will perform TUG <20 seconds PT Short Term Goal 3: patient will perform  5x sit to stand <30sec  PT Short Term Goal 4: Patient hip IR mobility will improve to >25 degree bilaterally to decrease low back pain.  PT Short Term Goal 4 - Progress: Progressing toward goal PT Short Term Goal 5: Patient's hip abduction strength to improve to 4/5 to indicated improving stability PT Short Term Goal 5 - Progress: Progressing toward goal PT Long Term Goals PT Long Term Goal 1: Patient will improve Berg score to 54/56 indicating patient is at less than 50% chance risk of fall PT Long Term Goal 2: Patient will perform TUG <15 seconds Long Term Goal 3: patient will perform  5x sit to stand <15sec  Long Term Goal 4: Patient's hip abduction strength to improve to 4+/5 to indicated improving stability and will ambulate without a trendelenburg sign to indicate increased strength in hip abductors and increased stability and safety  while walking.  PT Long Term Goal 5: Patient will be abl;e to ambulate >59minute without fear of falling  Problem List Patient Active Problem List   Diagnosis Date Noted  . Balance problem 12/12/2013  . Abnormality of gait 12/12/2013  . Precordial pain 08/12/2012  . COPD (chronic obstructive pulmonary disease) 10/14/2011  . H/O: lung cancer 10/14/2011  . CKD (chronic kidney disease),  stage IV 10/14/2011     PT - End of Session Equipment Utilized During Treatment: Gait belt Activity Tolerance: Patient tolerated treatment well General Behavior During Therapy: WFL for tasks assessed/performed  GP Functional Assessment Tool Used: (Based on FOTO and clinical judgement this sessoin) FOTO and BERG Balance score Functional Limitation: Mobility: Walking and moving around Mobility: Walking and Moving Around Current Status [phone]): At least 60 percent but less than 80 percent impaired, limited or restricted Mobility: Walking and Moving Around Goal Status [phone]): At least 40 percent but less than 60 percent impaired, limited or restricted  Aldona Lento PTA 01/23/2014, 4:52 PM  Devona Konig PT DPT

## 2014-01-26 ENCOUNTER — Ambulatory Visit (HOSPITAL_COMMUNITY): Payer: Medicare Other

## 2014-01-29 ENCOUNTER — Ambulatory Visit (HOSPITAL_COMMUNITY)
Admission: RE | Admit: 2014-01-29 | Discharge: 2014-01-29 | Disposition: A | Discharge status: Home / Self Care | Payer: Medicare Other | Source: Ambulatory Visit | Attending: Pulmonary Disease | Admitting: Pulmonary Disease

## 2014-01-29 NOTE — Progress Notes (Signed)
Physical Therapy Treatment Patient Details  Name: Paul Gray MRN: [id number] Date of Birth: 06/19/1923  Today's Date: 01/29/2014 Time: 1515-1600 PT Time Calculation (min): 45 min  Visit#: 11 of 12  Re-eval: 02/18/14 Authorization: Medicare  Authorization Visit#: 10 of 20  Charges:  NMR 1515-1530 (15'), therex 1532-1600 (28')  Subjective: Symptoms/Limitations Symptoms: Pt states he is tired today.  Thought his appointment was tomorrow and was planting tomato plants when his wife informed him it was today.   Pain Assessment Currently in Pain?: No/denies   Exercise/Treatments Aerobic Stationary Bike: NuStep L4 x 10 min Standing Forward Lunges: Both;10 reps;Limitations Forward Lunges Limitations: onto 14" step Lateral Step Up: Both;Step Height: 6";10 reps;Limitations Lateral Step Up Limitations: multimodal cueing for technique and posture Forward Step Up: Both;10 reps;Step Height: 6";Hand Hold: 2 Step Down: Both;10 reps;Hand Hold: 1;Step Height: 4" Functional Squat: 10 reps Functional Squat Limitations: manual facilitation  Rocker Board: 2 minutes    Physical Therapy Assessment and Plan PT Assessment and Plan Clinical Impression Statement: Pt fatigued easily today needing multiple seated rest breaks.  Pt continues to requires therapist facilitation to get into proper alighnment to complete therex and with demonstration.   PT Plan: Re-evaluate next visit.     Problem List Patient Active Problem List   Diagnosis Date Noted  . Balance problem 12/12/2013  . Abnormality of gait 12/12/2013  . Precordial pain 08/12/2012  . COPD (chronic obstructive pulmonary disease) 10/14/2011  . H/O: lung cancer 10/14/2011  . CKD (chronic kidney disease), stage IV 10/14/2011    PT - End of Session Equipment Utilized During Treatment: Gait belt Activity Tolerance: Patient tolerated treatment well General Behavior During Therapy: [facility] for tasks assessed/performed   Teena Irani,  PTA/CLT 01/29/2014, 4:14 PM

## 2014-02-02 ENCOUNTER — Ambulatory Visit (HOSPITAL_COMMUNITY)
Admission: RE | Admit: 2014-02-02 | Discharge: 2014-02-02 | Disposition: A | Discharge status: Home / Self Care | Payer: Medicare Other | Source: Ambulatory Visit | Attending: Pulmonary Disease | Admitting: Pulmonary Disease

## 2014-02-02 DIAGNOSIS — R269 Unspecified abnormalities of gait and mobility: Secondary | ICD-10-CM

## 2014-02-02 NOTE — Evaluation (Signed)
Physical Therapy Discharge  Patient Details  Name: Paul Gray MRN: [id number] Date of Birth: 10-26-1922  Today's Date: 02/02/2014 Time: 1856-3149 PT Time Calculation (min): 45 min   Charges: There [phone], There Act 5027-7412           Visit#: 12 of 12  Re-eval: 02/18/14 Assessment Diagnosis: Balance  Prior Therapy: no  Authorization: Medicare    Authorization Time Period:    Authorization Visit#: 12 of 20   Past Medical History:  Past Medical History  Diagnosis Date  . Lung cancer   . Back pain   . BPH (benign prostatic hyperplasia)   . S/P colonoscopy 2002, 2007    Hyperplastic polyps 2002; pancolonic diverticula 2007  . Hx of adenomatous colonic polyps   . COPD (chronic obstructive pulmonary disease)   . CKD (chronic kidney disease) stage 4, GFR 15-29 ml/min     Creatinine 2.0   Past Surgical History:  Past Surgical History  Procedure Laterality Date  . Hernia surgery x 3      Inguinal hernia repair  . Cholecystectomy    . Appendectomy    . Radiofrequency ablation for lung cancer      Subjective Symptoms/Limitations Symptoms: Patient states he feels good and is ready for discharge though he wants to be instructed on an updated HEP Pertinent History: prior to thjerapy patient ha, recent fall 3 weeks ago, and again 2 weeks before that, prior to that it had been years since the falls. lt LE shorter than Rt LE. History of back pain. No histry of diabetes, CHF or stroke.  Limitations: Standing;Walking How long can you sit comfortably?: no difficulty How long can you stand comfortably?: no difficulty How long can you walk comfortably?: Patient can walk for 30 minutes with occasonal rest break following 30 minutes  Precautions/Restrictions  Precautions Precautions: Fall  Cognition/Observation Observation/Other Assessments Observations: Leg length descrpancy: Lt LE 2.5 cm > Rt Other Assessments: Patient ambulate with obvious trendelenbrg gait and with R hip  forward rotated throughout gait.   Sensation/Coordination/Flexibility/Functional Tests Functional Tests Functional Tests: 5x sit to stand 14 seconds Functional Tests: FOTO 74% was 62%  Assessment    Exercise/Treatments Mobility/Balance  Berg Balance Test Sit to Stand: Able to stand without using hands and stabilize independently Standing Unsupported: Able to stand safely 2 minutes Sitting with Back Unsupported but Feet Supported on Floor or Stool: Able to sit safely and securely 2 minutes Stand to Sit: Sits safely with minimal use of hands Transfers: Able to transfer safely, minor use of hands Standing Unsupported with Eyes Closed: Able to stand 10 seconds safely Standing Ubsupported with Feet Together: Able to place feet together independently and stand 1 minute safely From Standing, Reach Forward with Outstretched Arm: Can reach confidently >25 cm (10") From Standing Position, Pick up Object from Floor: Able to pick up shoe safely and easily From Standing Position, Turn to Look Behind Over each Shoulder: Looks behind from both sides and weight shifts well Turn 360 Degrees: Able to turn 360 degrees safely in 4 seconds or less Standing Unsupported, Alternately Place Feet on Step/Stool: Able to stand independently and safely and complete 8 steps in 20 seconds Standing Unsupported, One Foot in Front: Able to plae foot ahead of the other independently and hold 30 seconds Standing on One Leg: Tries to lift leg/unable to hold 3 seconds but remains standing independently Total Score: 52   Balance Exercises Standing Sidestepping: 3 reps (27f in parallel bars) Other Standing Exercises: Squat matrix  5x each  Timed up and Go: 3 trials, average 17.49 Lateral lunges 10x 3D hip excursion 10x  Physical Therapy Assessment and Plan PT Assessment and Plan Clinical Impression Statement: Patiet has met all short term goals and has met or is progressing toward all long term goals with therapist  beleiving patient can independently perform exercises to achieve his goals alone, Patient displays low risk of falls as indicated by his improved Berg balance scores, decreased timed up and go time, and decreased  5x sit to stand. This patient is appropriate to discharge from [facility] and is expected to continue to progress with performance of HEP . PT Plan: Patient discharged with HEP    Goals Home Exercise Program Pt/caregiver will Perform Home Exercise Program: For increased ROM;For increased strengthening;Independently;For improved balance PT Goal: Perform Home Exercise Program - Progress: Met PT Short Term Goals PT Short Term Goal 1: Patient will improve Berg score to 52/56 indicating patient is at less than 50% chance risk of fall PT Short Term Goal 1 - Progress: Met PT Short Term Goal 2: Patient will perform TUG <20 seconds PT Short Term Goal 2 - Progress: Met PT Short Term Goal 3: patient will perform  5x sit to stand <30sec  PT Short Term Goal 3 - Progress: Met PT Short Term Goal 4: Patient hip IR mobility will improve to >25 degree bilaterally to decrease low back pain.  PT Short Term Goal 4 - Progress: Met PT Short Term Goal 5: Patient's hip abduction strength to improve to 4/5 to indicated improving stability PT Short Term Goal 5 - Progress: Met PT Long Term Goals PT Long Term Goal 1: Patient will improve Berg score to 54/56 indicating patient is at less than 50% chance risk of fall PT Long Term Goal 1 - Progress: Partly met PT Long Term Goal 2: Patient will perform TUG <15 seconds (average 17 seconds following 3 trials) PT Long Term Goal 2 - Progress: Not met Long Term Goal 3: patient will perform  5x sit to stand <15sec  Long Term Goal 3 Progress: Met Long Term Goal 4: Patient's hip abduction strength to improve to 4+/5 to indicated improving stability and will ambulate without a trendelenburg sign to indicate increased strength in hip abductors and increased stability and  safety  while walking.  Long Term Goal 4 Progress: Not met PT Long Term Goal 5: Patient will be abl;e to ambulate >30mnute without fear of falling Long Term Goal 5 Progress: Met  Problem List Patient Active Problem List   Diagnosis Date Noted  . Balance problem 12/12/2013  . Abnormality of gait 12/12/2013  . Precordial pain 08/12/2012  . COPD (chronic obstructive pulmonary disease) 10/14/2011  . H/O: lung cancer 10/14/2011  . CKD (chronic kidney disease), stage IV 10/14/2011    PT - End of Session Equipment Utilized During Treatment: Gait belt Activity Tolerance: Patient tolerated treatment well General Behavior During Therapy: WFL for tasks assessed/performed PT Plan of Care PT Home Exercise Plan: heel and toe raises, frontal plane hip excursions, Frontal plane lunges, squat matrix, side stepping Consulted and Agree with Plan of Care: Patient  GP Functional Assessment Tool Used: (Based on FOTO and clinical judgement this sessoin) FOTO and BERG Balance score Functional Limitation: Mobility: Walking and moving around Mobility: Walking and Moving Around Current Status (([id number]: At least 20 percent but less than 40 percent impaired, limited or restricted Mobility: Walking and Moving Around Goal Status ([phone]: At least 40 percent but less than 60  percent impaired, limited or restricted Mobility: Walking and Moving Around Discharge Status [phone]): At least 20 percent but less than 40 percent impaired, limited or restricted  Leia Alf 02/02/2014, 7:22 PM  Physician Documentation Your signature is required to indicate approval of the treatment plan as stated above.  Please sign and either send electronically or make a copy of this report for your files and return this physician signed original.   Please mark one 1.__approve of plan  2. ___approve of plan with the following conditions.   ______________________________                                                           _____________________ Physician Signature                                                                                                             Date

## 2014-02-06 ENCOUNTER — Ambulatory Visit (HOSPITAL_COMMUNITY): Payer: Medicare Other

## 2014-02-09 ENCOUNTER — Ambulatory Visit (HOSPITAL_COMMUNITY): Payer: Medicare Other | Admitting: Physical Therapy

## 2014-03-30 ENCOUNTER — Ambulatory Visit (INDEPENDENT_AMBULATORY_CARE_PROVIDER_SITE_OTHER): Payer: Medicare Other | Admitting: Urology

## 2014-03-30 DIAGNOSIS — N138 Other obstructive and reflux uropathy: Secondary | ICD-10-CM

## 2014-03-30 DIAGNOSIS — N401 Enlarged prostate with lower urinary tract symptoms: Secondary | ICD-10-CM

## 2014-03-30 DIAGNOSIS — N390 Urinary tract infection, site not specified: Secondary | ICD-10-CM

## 2014-05-10 ENCOUNTER — Ambulatory Visit (INDEPENDENT_AMBULATORY_CARE_PROVIDER_SITE_OTHER): Payer: 59 | Admitting: General Surgery

## 2014-05-25 ENCOUNTER — Other Ambulatory Visit (INDEPENDENT_AMBULATORY_CARE_PROVIDER_SITE_OTHER): Payer: Self-pay | Admitting: General Surgery

## 2014-05-25 ENCOUNTER — Ambulatory Visit (INDEPENDENT_AMBULATORY_CARE_PROVIDER_SITE_OTHER): Payer: Self-pay | Admitting: General Surgery

## 2014-06-05 ENCOUNTER — Encounter (HOSPITAL_COMMUNITY): Payer: Self-pay | Admitting: Pharmacy Technician

## 2014-06-07 ENCOUNTER — Other Ambulatory Visit (HOSPITAL_COMMUNITY): Payer: Medicare Other

## 2014-06-07 ENCOUNTER — Encounter (HOSPITAL_COMMUNITY): Payer: Self-pay | Admitting: *Deleted

## 2014-06-07 MED ORDER — CEFAZOLIN SODIUM-DEXTROSE 2-3 GM-% IV SOLR: 2.0000 g | INTRAVENOUS | Status: DC

## 2014-06-07 NOTE — Progress Notes (Signed)
Paul Gray had called his son and told him that I had called and he wanted his son to do the pre-op call. [name] Stender (son) verified allergies, meds, medical/surgical history. Pre-op instructions given to son.

## 2014-06-08 ENCOUNTER — Encounter (HOSPITAL_COMMUNITY): Payer: Self-pay | Admitting: Anesthesiology

## 2014-06-08 ENCOUNTER — Ambulatory Visit (HOSPITAL_COMMUNITY): Payer: Medicare Other

## 2014-06-08 ENCOUNTER — Encounter (HOSPITAL_COMMUNITY)
Admission: RE | Disposition: A | Discharge status: Home / Self Care | Payer: Self-pay | Source: Ambulatory Visit | Attending: General Surgery

## 2014-06-08 ENCOUNTER — Ambulatory Visit (HOSPITAL_COMMUNITY): Payer: Medicare Other | Admitting: Anesthesiology

## 2014-06-08 ENCOUNTER — Ambulatory Visit (HOSPITAL_COMMUNITY)
Admission: RE | Admit: 2014-06-08 | Discharge: 2014-06-10 | Disposition: A | Discharge status: Home / Self Care | Payer: Medicare Other | Source: Ambulatory Visit | Attending: General Surgery | Admitting: General Surgery

## 2014-06-08 ENCOUNTER — Encounter (HOSPITAL_COMMUNITY): Payer: Medicare Other | Admitting: Anesthesiology

## 2014-06-08 DIAGNOSIS — J4489 Other specified chronic obstructive pulmonary disease: Secondary | ICD-10-CM | POA: Insufficient documentation

## 2014-06-08 DIAGNOSIS — R339 Retention of urine, unspecified: Secondary | ICD-10-CM | POA: Diagnosis not present

## 2014-06-08 DIAGNOSIS — J449 Chronic obstructive pulmonary disease, unspecified: Secondary | ICD-10-CM | POA: Diagnosis not present

## 2014-06-08 DIAGNOSIS — Z9089 Acquired absence of other organs: Secondary | ICD-10-CM | POA: Diagnosis not present

## 2014-06-08 DIAGNOSIS — Z880 Allergy status to penicillin: Secondary | ICD-10-CM | POA: Insufficient documentation

## 2014-06-08 DIAGNOSIS — Z860100 Personal history of colon polyps, unspecified: Secondary | ICD-10-CM | POA: Insufficient documentation

## 2014-06-08 DIAGNOSIS — N184 Chronic kidney disease, stage 4 (severe): Secondary | ICD-10-CM | POA: Insufficient documentation

## 2014-06-08 DIAGNOSIS — Z85118 Personal history of other malignant neoplasm of bronchus and lung: Secondary | ICD-10-CM | POA: Insufficient documentation

## 2014-06-08 DIAGNOSIS — N401 Enlarged prostate with lower urinary tract symptoms: Secondary | ICD-10-CM | POA: Insufficient documentation

## 2014-06-08 DIAGNOSIS — N138 Other obstructive and reflux uropathy: Secondary | ICD-10-CM | POA: Insufficient documentation

## 2014-06-08 DIAGNOSIS — K4090 Unilateral inguinal hernia, without obstruction or gangrene, not specified as recurrent: Secondary | ICD-10-CM | POA: Diagnosis not present

## 2014-06-08 HISTORY — PX: INGUINAL HERNIA REPAIR: SHX194

## 2014-06-08 HISTORY — PX: HERNIA REPAIR: SHX51

## 2014-06-08 HISTORY — PX: INSERTION OF MESH: SHX5868

## 2014-06-08 HISTORY — DX: Shortness of breath: R06.02

## 2014-06-08 LAB — COMPREHENSIVE METABOLIC PANEL
ALBUMIN: 3.8 g/dL (ref 3.5–5.2)
ALT: 10 U/L (ref 0–53)
ANION GAP: 10 (ref 5–15)
AST: 13 U/L (ref 0–37)
Alkaline Phosphatase: 88 U/L (ref 39–117)
BUN: 32 mg/dL — ABNORMAL HIGH (ref 6–23)
CALCIUM: 9.2 mg/dL (ref 8.4–10.5)
CO2: 26 mEq/L (ref 19–32)
CREATININE: 1.62 mg/dL — AB (ref 0.50–1.35)
Chloride: 111 mEq/L (ref 96–112)
GFR calc Af Amer: 42 mL/min — ABNORMAL LOW (ref >90)
GFR, EST NON AFRICAN AMERICAN: 36 mL/min — AB (ref >90)
Glucose, Bld: 95 mg/dL (ref 70–99)
Potassium: 4.3 mEq/L (ref 3.7–5.3)
Sodium: 147 mEq/L (ref 137–147)
Total Bilirubin: 0.4 mg/dL (ref 0.3–1.2)
Total Protein: 6.1 g/dL (ref 6.0–8.3)

## 2014-06-08 LAB — CBC WITH DIFFERENTIAL/PLATELET
BASOS PCT: 1 % (ref 0–1)
Basophils Absolute: 0 10*3/uL (ref 0.0–0.1)
EOS PCT: 3 % (ref 0–5)
Eosinophils Absolute: 0.2 10*3/uL (ref 0.0–0.7)
HEMATOCRIT: 39.3 % (ref 39.0–52.0)
Hemoglobin: 12.6 g/dL — ABNORMAL LOW (ref 13.0–17.0)
Lymphocytes Relative: 19 % (ref 12–46)
Lymphs Abs: 1.1 10*3/uL (ref 0.7–4.0)
MCH: 30.9 pg (ref 26.0–34.0)
MCHC: 32.1 g/dL (ref 30.0–36.0)
MCV: 96.3 fL (ref 78.0–100.0)
MONO ABS: 0.3 10*3/uL (ref 0.1–1.0)
Monocytes Relative: 6 % (ref 3–12)
Neutro Abs: 4.3 10*3/uL (ref 1.7–7.7)
Neutrophils Relative %: 71 % (ref 43–77)
Platelets: 151 10*3/uL (ref 150–400)
RBC: 4.08 MIL/uL — ABNORMAL LOW (ref 4.22–5.81)
RDW: 14.9 % (ref 11.5–15.5)
WBC: 5.9 10*3/uL (ref 4.0–10.5)

## 2014-06-08 LAB — PROTIME-INR
INR: 1.08 (ref 0.00–1.49)
PROTHROMBIN TIME: 14 s (ref 11.6–15.2)

## 2014-06-08 SURGERY — REPAIR, HERNIA, INGUINAL, ADULT
Anesthesia: General | Site: Groin | Laterality: Right

## 2014-06-08 MED ORDER — NEOSTIGMINE METHYLSULFATE 10 MG/10ML IV SOLN
INTRAVENOUS | Status: DC | PRN
Start: 2014-06-08 — End: 2014-06-08
  Administered 2014-06-08: 3 mg via INTRAVENOUS

## 2014-06-08 MED ORDER — PHENYLEPHRINE HCL 10 MG/ML IJ SOLN
INTRAMUSCULAR | Status: DC | PRN
  Administered 2014-06-08: 40 ug via INTRAVENOUS
  Administered 2014-06-08: 80 ug via INTRAVENOUS

## 2014-06-08 MED ORDER — GLYCOPYRROLATE 0.2 MG/ML IJ SOLN
INTRAMUSCULAR | Status: DC | PRN
  Administered 2014-06-08 (×2): 0.4 mg via INTRAVENOUS

## 2014-06-08 MED ORDER — ROCURONIUM BROMIDE 50 MG/5ML IV SOLN
INTRAVENOUS | Status: AC
  Filled 2014-06-08: qty 1

## 2014-06-08 MED ORDER — HEPARIN SODIUM (PORCINE) 5000 UNIT/ML IJ SOLN
5000.0000 [IU] | Freq: Three times a day (TID) | INTRAMUSCULAR | Status: DC
  Administered 2014-06-09 (×2): 5000 [IU] via SUBCUTANEOUS
  Filled 2014-06-08 (×7): qty 1

## 2014-06-08 MED ORDER — ONDANSETRON HCL 4 MG/2ML IJ SOLN: 4.0000 mg | Freq: Once | INTRAMUSCULAR | Status: DC | PRN

## 2014-06-08 MED ORDER — ONDANSETRON HCL 4 MG PO TABS: 4.0000 mg | ORAL_TABLET | Freq: Four times a day (QID) | ORAL | Status: DC | PRN

## 2014-06-08 MED ORDER — ONDANSETRON HCL 4 MG/2ML IJ SOLN
INTRAMUSCULAR | Status: DC | PRN
  Administered 2014-06-08: 4 mg via INTRAVENOUS

## 2014-06-08 MED ORDER — FENTANYL CITRATE 0.05 MG/ML IJ SOLN
INTRAMUSCULAR | Status: DC | PRN
  Administered 2014-06-08 (×3): 50 ug via INTRAVENOUS

## 2014-06-08 MED ORDER — LACTATED RINGERS IV SOLN
INTRAVENOUS | Status: DC
  Administered 2014-06-08 (×2): via INTRAVENOUS

## 2014-06-08 MED ORDER — HYDROCODONE-ACETAMINOPHEN 5-325 MG PO TABS
1.0000 | ORAL_TABLET | ORAL | Status: DC | PRN
  Administered 2014-06-08 – 2014-06-10 (×5): 2 via ORAL
  Filled 2014-06-08 (×5): qty 2

## 2014-06-08 MED ORDER — HYDROMORPHONE HCL 1 MG/ML IJ SOLN
INTRAMUSCULAR | Status: AC
  Filled 2014-06-08: qty 1

## 2014-06-08 MED ORDER — ROCURONIUM BROMIDE 100 MG/10ML IV SOLN
INTRAVENOUS | Status: DC | PRN
  Administered 2014-06-08: 30 mg via INTRAVENOUS
  Administered 2014-06-08: 10 mg via INTRAVENOUS

## 2014-06-08 MED ORDER — 0.9 % SODIUM CHLORIDE (POUR BTL) OPTIME
TOPICAL | Status: DC | PRN
  Administered 2014-06-08: 1000 mL

## 2014-06-08 MED ORDER — TAMSULOSIN HCL 0.4 MG PO CAPS
0.4000 mg | ORAL_CAPSULE | Freq: Every day | ORAL | Status: DC
  Administered 2014-06-08 – 2014-06-10 (×3): 0.4 mg via ORAL
  Filled 2014-06-08 (×3): qty 1

## 2014-06-08 MED ORDER — MORPHINE SULFATE 2 MG/ML IJ SOLN
1.0000 mg | INTRAMUSCULAR | Status: DC | PRN
  Administered 2014-06-08 (×3): 2 mg via INTRAVENOUS
  Filled 2014-06-08 (×3): qty 1

## 2014-06-08 MED ORDER — HYDROMORPHONE HCL 1 MG/ML IJ SOLN
0.2500 mg | INTRAMUSCULAR | Status: DC | PRN
  Administered 2014-06-08: 0.25 mg via INTRAVENOUS

## 2014-06-08 MED ORDER — MOMETASONE FURO-FORMOTEROL FUM 100-5 MCG/ACT IN AERO
2.0000 | INHALATION_SPRAY | Freq: Two times a day (BID) | RESPIRATORY_TRACT | Status: DC
  Administered 2014-06-09 – 2014-06-10 (×3): 2 via RESPIRATORY_TRACT
  Filled 2014-06-08 (×2): qty 8.8

## 2014-06-08 MED ORDER — SUCCINYLCHOLINE CHLORIDE 20 MG/ML IJ SOLN
INTRAMUSCULAR | Status: AC
  Filled 2014-06-08: qty 1

## 2014-06-08 MED ORDER — LACTATED RINGERS IV SOLN
INTRAVENOUS | Status: DC
  Administered 2014-06-08: via INTRAVENOUS

## 2014-06-08 MED ORDER — TIOTROPIUM BROMIDE MONOHYDRATE 18 MCG IN CAPS
18.0000 ug | ORAL_CAPSULE | Freq: Every day | RESPIRATORY_TRACT | Status: DC
  Administered 2014-06-09 – 2014-06-10 (×2): 18 ug via RESPIRATORY_TRACT
  Filled 2014-06-08: qty 5

## 2014-06-08 MED ORDER — LIDOCAINE HCL (CARDIAC) 20 MG/ML IV SOLN
INTRAVENOUS | Status: DC | PRN
  Administered 2014-06-08: 50 mg via INTRAVENOUS

## 2014-06-08 MED ORDER — CEFAZOLIN SODIUM-DEXTROSE 2-3 GM-% IV SOLR
INTRAVENOUS | Status: DC | PRN
  Administered 2014-06-08: 2 g via INTRAVENOUS

## 2014-06-08 MED ORDER — PROPOFOL 10 MG/ML IV BOLUS
INTRAVENOUS | Status: DC | PRN
  Administered 2014-06-08: 120 mg via INTRAVENOUS

## 2014-06-08 MED ORDER — HYDROCODONE-ACETAMINOPHEN 5-325 MG PO TABS: 1.0000 | ORAL_TABLET | ORAL | Status: DC | PRN

## 2014-06-08 MED ORDER — BUPIVACAINE-EPINEPHRINE (PF) 0.5% -1:200000 IJ SOLN
INTRAMUSCULAR | Status: AC
  Filled 2014-06-08: qty 30

## 2014-06-08 MED ORDER — FENTANYL CITRATE 0.05 MG/ML IJ SOLN
INTRAMUSCULAR | Status: AC
  Filled 2014-06-08: qty 5

## 2014-06-08 MED ORDER — BUPIVACAINE-EPINEPHRINE 0.5% -1:200000 IJ SOLN
INTRAMUSCULAR | Status: DC | PRN
  Administered 2014-06-08: 20 mL

## 2014-06-08 MED ORDER — ONDANSETRON HCL 4 MG/2ML IJ SOLN: 4.0000 mg | Freq: Four times a day (QID) | INTRAMUSCULAR | Status: DC | PRN

## 2014-06-08 MED ORDER — PROPOFOL 10 MG/ML IV BOLUS
INTRAVENOUS | Status: AC
  Filled 2014-06-08: qty 20

## 2014-06-08 SURGICAL SUPPLY — 48 items
APL SKNCLS STERI-STRIP NONHPOA (GAUZE/BANDAGES/DRESSINGS) ×1
BENZOIN TINCTURE PRP APPL 2/3 (GAUZE/BANDAGES/DRESSINGS) ×3 IMPLANT
BLADE SURG 10 STRL SS (BLADE) ×3 IMPLANT
BLADE SURG 15 STRL LF DISP TIS (BLADE) ×1 IMPLANT
BLADE SURG 15 STRL SS (BLADE) ×3
BLADE SURG ROTATE 9660 (MISCELLANEOUS) IMPLANT
CHLORAPREP W/TINT 26ML (MISCELLANEOUS) ×3 IMPLANT
CLOSURE WOUND 1/2 X4 (GAUZE/BANDAGES/DRESSINGS)
CLOSURE WOUND 1/4X4 (GAUZE/BANDAGES/DRESSINGS) ×1
COVER SURGICAL LIGHT HANDLE (MISCELLANEOUS) ×3 IMPLANT
DRAIN PENROSE 1/2X12 LTX STRL (WOUND CARE) ×3 IMPLANT
DRAPE INCISE IOBAN 66X45 STRL (DRAPES) ×3 IMPLANT
DRAPE LAPAROTOMY TRNSV 102X78 (DRAPE) ×3 IMPLANT
DRAPE UTILITY 15X26 W/TAPE STR (DRAPE) ×6 IMPLANT
DRSG TEGADERM 4X4.75 (GAUZE/BANDAGES/DRESSINGS) ×3 IMPLANT
DRSG TELFA 3X8 NADH (GAUZE/BANDAGES/DRESSINGS) ×3 IMPLANT
ELECT CAUTERY BLADE 6.4 (BLADE) ×3 IMPLANT
ELECT REM PT RETURN 9FT ADLT (ELECTROSURGICAL) ×3
ELECTRODE REM PT RTRN 9FT ADLT (ELECTROSURGICAL) ×1 IMPLANT
GAUZE SPONGE 4X4 12PLY STRL (GAUZE/BANDAGES/DRESSINGS) ×3 IMPLANT
GLOVE BIOGEL PI IND STRL 6.5 (GLOVE) ×1 IMPLANT
GLOVE BIOGEL PI IND STRL 8 (GLOVE) ×1 IMPLANT
GLOVE BIOGEL PI INDICATOR 6.5 (GLOVE) ×2
GLOVE BIOGEL PI INDICATOR 8 (GLOVE) ×2
GLOVE ECLIPSE 8.0 STRL XLNG CF (GLOVE) ×3 IMPLANT
GOWN STRL REUS W/ TWL LRG LVL3 (GOWN DISPOSABLE) ×2 IMPLANT
GOWN STRL REUS W/TWL LRG LVL3 (GOWN DISPOSABLE) ×6
KIT BASIN OR (CUSTOM PROCEDURE TRAY) ×3 IMPLANT
KIT ROOM TURNOVER OR (KITS) ×3 IMPLANT
MESH HERNIA 3X6 (Mesh General) ×3 IMPLANT
NEEDLE HYPO 25GX1X1/2 BEV (NEEDLE) ×3 IMPLANT
NS IRRIG 1000ML POUR BTL (IV SOLUTION) ×3 IMPLANT
PACK SURGICAL SETUP 50X90 (CUSTOM PROCEDURE TRAY) ×3 IMPLANT
PAD ARMBOARD 7.5X6 YLW CONV (MISCELLANEOUS) ×3 IMPLANT
PENCIL BUTTON HOLSTER BLD 10FT (ELECTRODE) ×3 IMPLANT
SPONGE LAP 18X18 X RAY DECT (DISPOSABLE) ×3 IMPLANT
STRIP CLOSURE SKIN 1/2X4 (GAUZE/BANDAGES/DRESSINGS) IMPLANT
STRIP CLOSURE SKIN 1/4X4 (GAUZE/BANDAGES/DRESSINGS) ×2 IMPLANT
SUT MON AB 4-0 PC3 18 (SUTURE) ×3 IMPLANT
SUT PROLENE 2 0 CT2 30 (SUTURE) ×6 IMPLANT
SUT SILK 2 0 SH (SUTURE) IMPLANT
SUT VIC AB 2-0 SH 18 (SUTURE) ×3 IMPLANT
SUT VIC AB 3-0 SH 27 (SUTURE) ×6
SUT VIC AB 3-0 SH 27XBRD (SUTURE) ×2 IMPLANT
SUT VICRYL AB 3 0 TIES (SUTURE) ×3 IMPLANT
SYR CONTROL 10ML LL (SYRINGE) ×3 IMPLANT
TOWEL OR 17X24 6PK STRL BLUE (TOWEL DISPOSABLE) ×3 IMPLANT
TOWEL OR 17X26 10 PK STRL BLUE (TOWEL DISPOSABLE) ×3 IMPLANT

## 2014-06-08 NOTE — Anesthesia Preprocedure Evaluation (Signed)
Anesthesia Evaluation  Patient identified by MRN, date of birth, ID band Patient awake    Reviewed: Allergy & Precautions, H&P , NPO status , Patient's Chart, lab work & pertinent test results  Airway       Dental   Pulmonary COPDformer smoker,          Cardiovascular     Neuro/Psych    GI/Hepatic   Endo/Other    Renal/GU CRFRenal disease     Musculoskeletal   Abdominal   Peds  Hematology   Anesthesia Other Findings   Reproductive/Obstetrics                           Anesthesia Physical Anesthesia Plan  ASA: III  Anesthesia Plan: General   Post-op Pain Management:    Induction: Intravenous  Airway Management Planned: Oral ETT  Additional Equipment:   Intra-op Plan:   Post-operative Plan: Extubation in OR  Informed Consent: I have reviewed the patients History and Physical, chart, labs and discussed the procedure including the risks, benefits and alternatives for the proposed anesthesia with the patient or authorized representative who has indicated his/her understanding and acceptance.     Plan Discussed with: CRNA, Anesthesiologist and Surgeon  Anesthesia Plan Comments:         Anesthesia Quick Evaluation

## 2014-06-08 NOTE — H&P (Signed)
Paul Gray is an 80 y.o. male.   Chief Complaint:   Here for elective surgery HPI: He has a symptomatic right inguinal hernia and presents for elective repair.  Past Medical History  Diagnosis Date  . Lung cancer   . Back pain   . BPH (benign prostatic hyperplasia)   . S/P colonoscopy 2002, 2007    Hyperplastic polyps 2002; pancolonic diverticula 2007  . Hx of adenomatous colonic polyps   . COPD (chronic obstructive pulmonary disease)   . CKD (chronic kidney disease) stage 4, GFR 15-29 ml/min     Creatinine 2.0    Past Surgical History  Procedure Laterality Date  . Hernia surgery x 3      Inguinal hernia repair  . Cholecystectomy    . Appendectomy    . Radiofrequency ablation for lung cancer    . Eye surgery      cataracts removed with lens implant    Family History  Problem Relation Age of Onset  . CAD Brother     Not premature  . Heart failure Father     Died in his 53s   Social History:  reports that he quit smoking about 10 years ago. His smoking use included Cigarettes. He has a 60 pack-year smoking history. He has never used smokeless tobacco. He reports that he does not drink alcohol or use illicit drugs.  Allergies:  Allergies  Allergen Reactions  . Penicillins Swelling    Medications Prior to Admission  Medication Sig Dispense Refill  . Fluticasone-Salmeterol (ADVAIR DISKUS) 250-50 MCG/DOSE AEPB Inhale 1 puff into the lungs every 12 (twelve) hours.       Marland Kitchen HYDROcodone-acetaminophen (NORCO/VICODIN) 5-325 MG per tablet Take 1 tablet by mouth every 6 (six) hours as needed (pain).       Marland Kitchen OVER THE COUNTER MEDICATION Take 1 application by mouth daily. Vitamin E oil for wound care      . Tamsulosin HCl (FLOMAX) 0.4 MG CAPS Take 0.4 mg by mouth daily.       Marland Kitchen tiotropium (SPIRIVA) 18 MCG inhalation capsule Place 18 mcg into inhaler and inhale daily.       Marland Kitchen trolamine salicylate (ASPERCREME) 10 % cream Apply 1 application topically at bedtime as needed (pain).         Results for orders placed during the hospital encounter of 06/08/14 (from the past 48 hour(s))  CBC WITH DIFFERENTIAL     Status: Abnormal   Collection Time    06/08/14  9:03 AM      Result Value Ref Range   WBC 5.9  4.0 - 10.5 K/uL   RBC 4.08 (*) 4.22 - 5.81 MIL/uL   Hemoglobin 12.6 (*) 13.0 - 17.0 g/dL   HCT 39.3  39.0 - 52.0 %   MCV 96.3  78.0 - 100.0 fL   MCH 30.9  26.0 - 34.0 pg   MCHC 32.1  30.0 - 36.0 g/dL   RDW 14.9  11.5 - 15.5 %   Platelets 151  150 - 400 K/uL   Neutrophils Relative % 71  43 - 77 %   Neutro Abs 4.3  1.7 - 7.7 K/uL   Lymphocytes Relative 19  12 - 46 %   Lymphs Abs 1.1  0.7 - 4.0 K/uL   Monocytes Relative 6  3 - 12 %   Monocytes Absolute 0.3  0.1 - 1.0 K/uL   Eosinophils Relative 3  0 - 5 %   Eosinophils Absolute 0.2  0.0 -  0.7 K/uL   Basophils Relative 1  0 - 1 %   Basophils Absolute 0.0  0.0 - 0.1 K/uL  COMPREHENSIVE METABOLIC PANEL     Status: Abnormal   Collection Time    06/08/14  9:03 AM      Result Value Ref Range   Sodium 147  137 - 147 mEq/L   Potassium 4.3  3.7 - 5.3 mEq/L   Chloride 111  96 - 112 mEq/L   CO2 26  19 - 32 mEq/L   Glucose, Bld 95  70 - 99 mg/dL   BUN 32 (*) 6 - 23 mg/dL   Creatinine, Ser 1.62 (*) 0.50 - 1.35 mg/dL   Calcium 9.2  8.4 - 10.5 mg/dL   Total Protein 6.1  6.0 - 8.3 g/dL   Albumin 3.8  3.5 - 5.2 g/dL   AST 13  0 - 37 U/L   ALT 10  0 - 53 U/L   Alkaline Phosphatase 88  39 - 117 U/L   Total Bilirubin 0.4  0.3 - 1.2 mg/dL   GFR calc non Af Amer 36 (*) >90 mL/min   GFR calc Af Amer 42 (*) >90 mL/min   Comment: (NOTE)     The eGFR has been calculated using the CKD EPI equation.     This calculation has not been validated in all clinical situations.     eGFR's persistently <90 mL/min signify possible Chronic Kidney     Disease.   Anion gap 10  5 - 15  PROTIME-INR     Status: None   Collection Time    06/08/14  9:03 AM      Result Value Ref Range   Prothrombin Time 14.0  11.6 - 15.2 seconds   INR 1.08   0.00 - 1.49   Dg Chest 2 View  06/08/2014   CLINICAL DATA:  Chronic obstructive pulmonary disease, preop hernia repair.  EXAM: CHEST  2 VIEW  COMPARISON:  October 10, 2013.  FINDINGS: Stable cardiomediastinal silhouette. No pneumothorax or significant pleural effusion is noted. Eventration is seen involving the posterior portion of the right hemidiaphragm. Stable scarring is noted in the left upper lobe. No acute pulmonary disease is noted. Bony thorax is intact.  IMPRESSION: No acute cardiopulmonary abnormality seen.   Electronically Signed   By: Sabino Dick M.D.   On: 06/08/2014 09:22    Review of Systems  HENT: Positive for hearing loss.   Gastrointestinal: Positive for constipation.  Musculoskeletal: Positive for back pain.  Neurological: Positive for weakness.  Endo/Heme/Allergies: Bruises/bleeds easily.    Blood pressure 174/59, pulse 54, temperature 97.7 F (36.5 C), temperature source Oral, resp. rate 18, height _0  (1.753 m), weight 151 lb 7 oz (68.692 kg), SpO2 99.00%. Physical Exam  Constitutional: He appears well-developed and well-nourished. No distress.  HENT:  Head: Normocephalic and atraumatic.  Cardiovascular: Normal rate and regular rhythm.   Respiratory: Effort normal and breath sounds normal.  Genitourinary:  Right inguinal bulge.  Left inguinal scar with no bulge.  Neurological: He is alert.  Skin: Skin is warm and dry.     Assessment/Plan Symptomatic right inguinal hernia  Plan:  Right inguinal hernia repair with mesh.    Duvall Comes J 06/08/2014, 11:16 AM

## 2014-06-08 NOTE — Transfer of Care (Signed)
Immediate Anesthesia Transfer of Care Note  Patient: Paul Gray  Procedure(s) Performed: Procedure(s): RIGHT INGUINAL HERNIA REPAIR WITH MESH (Right) INSERTION OF MESH (Right)  Patient Location: PACU  Anesthesia Type:General  Level of Consciousness: awake, alert  and oriented  Airway & Oxygen Therapy: Patient connected to face mask oxygen  Post-op Assessment: Report given to PACU RN  Post vital signs: stable  Complications: No apparent anesthesia complications

## 2014-06-08 NOTE — Anesthesia Postprocedure Evaluation (Signed)
  Anesthesia Post-op Note  Patient: Paul Gray  Procedure(s) Performed: Procedure(s): RIGHT INGUINAL HERNIA REPAIR WITH MESH (Right) INSERTION OF MESH (Right)  Patient Location: PACU  Anesthesia Type:General  Level of Consciousness: awake, alert , oriented and patient cooperative  Airway and Oxygen Therapy: Patient Spontanous Breathing  Post-op Pain: mild  Post-op Assessment: Post-op Vital signs reviewed, Patient's Cardiovascular Status Stable, Respiratory Function Stable, Patent Airway and No signs of Nausea or vomiting  Post-op Vital Signs: Reviewed and stable  Last Vitals:  Filed Vitals:   06/08/14 1410  BP:   Pulse:   Temp: 36.1 C  Resp:     Complications: No apparent anesthesia complications

## 2014-06-08 NOTE — Discharge Instructions (Signed)
[facility] _______[facility]  UMBILICAL OR INGUINAL HERNIA REPAIR: POST OP INSTRUCTIONS  Always review your discharge instruction sheet given to you by the facility where your surgery was performed. IF YOU HAVE DISABILITY OR FAMILY LEAVE FORMS, YOU MUST BRING THEM TO THE OFFICE FOR PROCESSING.   DO NOT GIVE THEM TO YOUR DOCTOR.  1. A  prescription for pain medication may be given to you upon discharge.  Take your pain medication as prescribed, if needed.  If narcotic pain medicine is not needed, then you may take acetaminophen (Tylenol) or ibuprofen (Advil) as needed. 2. Take your usually prescribed medications unless otherwise directed. 3. If you need a refill on your pain medication, please contact your pharmacy.  They will contact our office to request authorization. Prescriptions will not be filled after 5 pm or on week-ends. 4. You should follow a light diet the first 24 hours after arrival home, such as soup and crackers, etc.  Be sure to include lots of fluids daily.  Resume your normal diet the day after surgery. 5. Most patients will experience some swelling and bruising around the umbilicus or in the groin and scrotum.  Ice packs and reclining will help.  Swelling and bruising can take several days to resolve.  6. It is common to experience some constipation if taking pain medication after surgery.  Increasing fluid intake and taking a stool softener (such as Colace) will usually help or prevent this problem from occurring.  A mild laxative (Milk of Magnesia or Miralax) should be taken according to package directions if there are no bowel movements after 48 hours. 7. Unless discharge instructions indicate otherwise, you may remove your bandages 72 hours after surgery, and you may shower at that time.  You may have steri-strips (small skin tapes) in place directly over the incision.  These strips should be left on the skin.  If your surgeon used skin glue on the incision, you may  shower in 24 hours.  The glue will flake off over the next 2-3 weeks.  Any sutures or staples will be removed at the office during your follow-up visit. 8. ACTIVITIES:  You may resume regular (light) daily activities beginning the next day--such as daily self-care, walking, climbing stairs--gradually increasing activities as tolerated.  You may have sexual intercourse when it is comfortable.  Refrain from any heavy lifting or straining-nothing over 10 pounds for 6 weeks. a. You may drive when you are no longer taking prescription pain medication, you can comfortably wear a seatbelt, and you can safely maneuver your car and apply brakes. b. RETURN TO WORK:  __________________________________________________________ 9. You should see your doctor in the office for a follow-up appointment approximately 2-3 weeks after your surgery.  Make sure that you call for this appointment within a day or two after you arrive home to insure a convenient appointment time. 10. OTHER INSTRUCTIONS:  __________________________________________________________________________________________________________________________________________________________________________________________  WHEN TO CALL YOUR DOCTOR: 1. Fever over 101.0 2. Inability to urinate 3. Nausea and/or vomiting 4. Extreme swelling or bruising 5. Continued bleeding from incision. 6. Increased pain, redness, or drainage from the incision  The clinic staff is available to answer your questions during regular business hours.  Please dont hesitate to call and ask to speak to one of the nurses for clinical concerns.  If you have a medical emergency, go to the nearest emergency room or call 911.  A surgeon from [facility] is always on call at the hospital   [address]  [address], [place], [place], [place]  [address] ?  [address], [place], [place]   [address] [phone] ? [phone] ? FAX [phone] Web site:  [email or web address]

## 2014-06-08 NOTE — Interval H&P Note (Signed)
History and Physical Interval Note:  06/08/2014 11:19 AM  Paul Gray  has presented today for surgery, with the diagnosis of Right Inguinal Hernia  The various methods of treatment have been discussed with the patient and family. After consideration of risks, benefits and other options for treatment, the patient has consented to  Procedure(s): RIGHT INGUINAL HERNIA REPAIR WITH MESH (Right) INSERTION OF MESH (Right) as a surgical intervention .  The patient's history has been reviewed, patient examined, no change in status, stable for surgery.  I have reviewed the patient's chart and labs.  Questions were answered to the patient's satisfaction.     Niomie Englert Lenna Sciara

## 2014-06-08 NOTE — Op Note (Signed)
Preoperative diagnosis:  Right inguinal hernia.  Postoperative diagnosis:  Same  Procedure:  Right inguinal hernia repair with mesh.  Surgeon:  Jackolyn Confer, M.D.  Anesthesia:  General with local (Marcaine).  Indication:  Paul Gray is an 80 year old male with a symptomatic right inguinal hernia.  He presents for elective repair.  Technique:  He was seen in the holding room and the right groin was marked with my initials. He was brought to the operating, placed supine on the operating table, and the anesthetic was administered by the anesthesiologist. The hair in the groin area was clipped as was felt to be necessary. This area was then sterilely prepped and draped.  Local anesthetic was infiltrated in the superficial and deep tissues in the right groin.  An incision was made through the skin and subcutaneous tissue until the external oblique aponeurosis was identified.  Local anesthetic was infiltrated deep to the external oblique aponeurosis. The external oblique aponeurosis was divided through the external ring medially and back toward the anterior superior iliac spine laterally. Using blunt dissection, the shelving edge of the inguinal ligament was identified inferiorly and the internal oblique aponeurosis and muscle were identified superiorly. The ilioinguinal nerve was identified and preserved.  The spermatic cord was isolated and a posterior window was made around it. A moderate size indirect hernia sac was identified and separated from the spermatic cord using blunt dissection. The hernia sac and its contents were reduced through the indirect hernia defect.   A piece of 3" x 6" polypropylene mesh was brought into the field and anchored 1-2 cm medial to the pubic tubercle with 2-0 Prolene suture. The inferior aspect of the mesh was anchored to the shelving edge of the inguinal ligament with running 2-0 Prolene suture to a level 1-2 cm lateral to the internal ring. A slit was cut in the mesh  creating 2 tails. These were wrapped around the spermatic cord. The superior aspect of the mesh was anchored to the internal oblique aponeurosis and muscle with interrupted 2-0 Vicryl sutures. The 2 tails of the mesh were then crossed creating a new internal ring and were anchored to the shelving edge of the inguinal ligament with 2-0 Prolene suture. The tip of a hemostat could be placed through the new aperture. The lateral aspect of the mesh was then tucked deep to the external oblique aponeurosis.  The wound was inspected and hemostasis was adequate. The external oblique aponeurosis was then closed over the mesh and cord with running 3-0 Vicryl suture. The subcutaneous tissue was closed with running 3-0 Vicryl suture. The skin closed with a running 4-0 Monocryl subcuticular stitch.  Steri-Strips and a sterile dressing were applied.  The procedure was well-tolerated without any apparent complications and he was taken to the recovery room in satisfactory condition.

## 2014-06-08 NOTE — Anesthesia Procedure Notes (Signed)
Procedure Name: Intubation Date/Time: 06/08/2014 11:30 AM Performed by: Kyung Rudd Pre-anesthesia Checklist: Patient identified, Emergency Drugs available, Suction available, Patient being monitored and Timeout performed Patient Re-evaluated:Patient Re-evaluated prior to inductionOxygen Delivery Method: Circle system utilized Preoxygenation: Pre-oxygenation with 100% oxygen Intubation Type: IV induction Ventilation: Mask ventilation without difficulty Laryngoscope Size: Mac and 3 Grade View: Grade I Tube type: Oral Tube size: 7.5 mm Number of attempts: 1 Airway Equipment and Method: Stylet and LTA kit utilized Placement Confirmation: ETT inserted through vocal cords under direct vision,  positive ETCO2 and breath sounds checked- equal and bilateral Secured at: 23 cm Tube secured with: Tape Dental Injury: Teeth and Oropharynx as per pre-operative assessment

## 2014-06-08 NOTE — Progress Notes (Signed)
Bladder scanned 106cc.standing to void 60cc bl. tinghed with micro clots,also c/o burning I/O given as reason, pt./son voiced understanding.

## 2014-06-08 NOTE — Progress Notes (Signed)
I/O cath done by [employer]. Coude cath #16 used due to history of BPH and pt currently on Flomax.  500cc of urine obtained, pt tol well.

## 2014-06-09 DIAGNOSIS — K4090 Unilateral inguinal hernia, without obstruction or gangrene, not specified as recurrent: Secondary | ICD-10-CM | POA: Diagnosis not present

## 2014-06-09 MED ORDER — POLYETHYLENE GLYCOL 3350 17 G PO PACK
17.0000 g | PACK | Freq: Once | ORAL | Status: AC
  Administered 2014-06-09: 17 g via ORAL
  Filled 2014-06-09: qty 1

## 2014-06-09 NOTE — Progress Notes (Signed)
1 Day Post-Op  Subjective: Alert. Hemodynamically stable. Mild confusion. Hard of hearing. Son in room. Tolerating diet.  No reported problems. SpO2 94-100% on RA. Unable to void and was in and out catheter once for 500 cc. Dribbling now. On Flomax and followed [facility] as an outpatient.    Objective: Vital signs in last 24 hours: Temp:  [97 F (36.1 C)-98.2 F (36.8 C)] 98.2 F (36.8 C) (09/19 0151) Pulse Rate:  [50-92] 92 (09/19 0151) Resp:  [10-18] 12 (09/19 0151) BP: (145-174)/(59-81) 145/81 mmHg (09/19 0151) SpO2:  [94 %-100 %] 94 % (09/19 0151) Weight:  [151 lb 7 oz (68.692 kg)] 151 lb 7 oz (68.692 kg) (09/18 0935)    Intake/Output from previous day: 09/18 0701 - 09/19 0700 In: 2200 [P.O.:720; I.V.:1480] Out: 625 [Urine:625] Intake/Output this shift: Total I/O In: -  Out: 125 [Urine:125]  General appearance: alert. Answers questions appropriately. Oriented to place and situation. Mild confusion. No physical distress. Resp: clear to auscultation bilaterally GI: aabdomen soft and upper abdomen. Suprapubic area for, somewhat tender, suspect  urinary retention Male genitalia: normal, right groin incision looks good. Hernia repair intact. No hematoma or bruising.  Lab Results:  Results for orders placed during the hospital encounter of 06/08/14 (from the past 24 hour(s))  CBC WITH DIFFERENTIAL     Status: Abnormal   Collection Time    06/08/14  9:03 AM      Result Value Ref Range   WBC 5.9  4.0 - 10.5 K/uL   RBC 4.08 (*) 4.22 - 5.81 MIL/uL   Hemoglobin 12.6 (*) 13.0 - 17.0 g/dL   HCT 39.3  39.0 - 52.0 %   MCV 96.3  78.0 - 100.0 fL   MCH 30.9  26.0 - 34.0 pg   MCHC 32.1  30.0 - 36.0 g/dL   RDW 14.9  11.5 - 15.5 %   Platelets 151  150 - 400 K/uL   Neutrophils Relative % 71  43 - 77 %   Neutro Abs 4.3  1.7 - 7.7 K/uL   Lymphocytes Relative 19  12 - 46 %   Lymphs Abs 1.1  0.7 - 4.0 K/uL   Monocytes Relative 6  3 - 12 %   Monocytes Absolute 0.3  0.1 - 1.0  K/uL   Eosinophils Relative 3  0 - 5 %   Eosinophils Absolute 0.2  0.0 - 0.7 K/uL   Basophils Relative 1  0 - 1 %   Basophils Absolute 0.0  0.0 - 0.1 K/uL  COMPREHENSIVE METABOLIC PANEL     Status: Abnormal   Collection Time    06/08/14  9:03 AM      Result Value Ref Range   Sodium 147  137 - 147 mEq/L   Potassium 4.3  3.7 - 5.3 mEq/L   Chloride 111  96 - 112 mEq/L   CO2 26  19 - 32 mEq/L   Glucose, Bld 95  70 - 99 mg/dL   BUN 32 (*) 6 - 23 mg/dL   Creatinine, Ser 1.62 (*) 0.50 - 1.35 mg/dL   Calcium 9.2  8.4 - 10.5 mg/dL   Total Protein 6.1  6.0 - 8.3 g/dL   Albumin 3.8  3.5 - 5.2 g/dL   AST 13  0 - 37 U/L   ALT 10  0 - 53 U/L   Alkaline Phosphatase 88  39 - 117 U/L   Total Bilirubin 0.4  0.3 - 1.2 mg/dL   GFR calc non  Af Amer 36 (*) >90 mL/min   GFR calc Af Amer 42 (*) >90 mL/min   Anion gap 10  5 - 15  PROTIME-INR     Status: None   Collection Time    06/08/14  9:03 AM      Result Value Ref Range   Prothrombin Time 14.0  11.6 - 15.2 seconds   INR 1.08  0.00 - 1.49     Studies/Results: Dg Chest 2 View  06/08/2014   CLINICAL DATA:  Chronic obstructive pulmonary disease, preop hernia repair.  EXAM: CHEST  2 VIEW  COMPARISON:  October 10, 2013.  FINDINGS: Stable cardiomediastinal silhouette. No pneumothorax or significant pleural effusion is noted. Eventration is seen involving the posterior portion of the right hemidiaphragm. Stable scarring is noted in the left upper lobe. No acute pulmonary disease is noted. Bony thorax is intact.  IMPRESSION: No acute cardiopulmonary abnormality seen.   Electronically Signed   By: Sabino Dick M.D.   On: 06/08/2014 09:22    . heparin subcutaneous  5,000 Units Subcutaneous 3 times per day  . mometasone-formoterol  2 puff Inhalation BID  . polyethylene glycol  17 g Oral Once  . tamsulosin  0.4 mg Oral Daily  . tiotropium  18 mcg Inhalation Daily     Assessment/Plan: s/p Procedure(s): RIGHT INGUINAL HERNIA REPAIR WITH MESH INSERTION  OF MESH   POD #1. Open repair right inguinal hernia with mesh. No direct wound or surgical problems.  Probable urinary retention. Scan bladder and insert and leave indwelling Foley now. Continue Flomax. Discussed with patient and son. Probably will leave Foley in for a few days and followup with his urologist at [facility] next week.  After catheter in we'll try to see if we can get him to ambulate with assistance. Possible discharge is admitted or tomorrow. Son will stay with patient at home. Possible assisted living placement Monday, according to son. Plans incomplete.  Miralax-one dose  @PROBHOSP @  LOS: 1 day    Patton Rabinovich M 06/09/2014  . .prob

## 2014-06-09 NOTE — Progress Notes (Addendum)
Patient has voided x 2 today for 150cc. Bladder scan shows >900cc.  Per discussion with MD this am, pt has failed voiding trial and will insert Coude foley catheter at this time. 750cc urine per foley.

## 2014-06-10 DIAGNOSIS — K4090 Unilateral inguinal hernia, without obstruction or gangrene, not specified as recurrent: Secondary | ICD-10-CM | POA: Diagnosis not present

## 2014-06-10 MED ORDER — POLYETHYLENE GLYCOL 3350 17 G PO PACK
17.0000 g | PACK | Freq: Once | ORAL | Status: AC
  Administered 2014-06-10: 17 g via ORAL
  Filled 2014-06-10: qty 1

## 2014-06-10 NOTE — Discharge Summary (Signed)
Patient ID: Paul Gray [id number] 80 y.o. [birth year]  Admit date: 06/08/2014  Discharge date and time: 06/10/2014  Admitting Physician: Elwin Mocha  Discharge Physician: Adin Hector  Admission Diagnoses: Right Inguinal Hernia  Discharge Diagnoses: Right inguinal hernia Postop urinary retention Benign prostatic hypertrophy History of lung cance, s/p RFA COPD CKD., stage IV  Operations: Procedure(s): RIGHT INGUINAL HERNIA REPAIR WITH MESH INSERTION OF MESH  Admission Condition: good  Discharged Condition: good  Indication for Admission: This patient was evaluated by Dr. Zella Richer as an outpatient for a symptomatic right inguinal hernia. He was brought to the operating room electively. He was admitted postop because of advanced age, trouble voiding, and fall risk.  Hospital Course: On the day of admission the patient underwent open repair of right inguinal hernia with mesh. Overnight he had trouble voiding and was catheterized once. On postop day 1 he was stable but still could not void and a catheter was placed. 900 cc of urine was evacuated and he felt much better. He became somewhat angulatory with assistance. He resumes diet.    His son stayed in the room with him throughout the hospitalization. The son states that he is probably going to be admitted to an assisted living at a retirement community in [place] he'll on Monday or Tuesday. The son about once to take him home with him today.    On the day of discharge the patient was relatively alert and in no distress. His abdomen was soft and flat and nontender. The right groin incision looked good without hematoma. A Foley catheter was draining clear urine.     The patient and his son were instructed in Foley catheter care. They were instructed to call [facility] to arrange followup next week for urodynamics and catheter removal. He has been followed for some time by the urologist and  has had a Foley catheter in the past. Diet and activities were discussed.         The son stated they have plenty of hydrocodone at home and so no prescription was required.     The patient was requested to call and set up an appointment she Dr. Zella Richer in 2 or 3 weeks.   Consults: None  Significant Diagnostic Studies: none  Treatments: surgery: Repair right inguinal hernia with mesh, and insertion of Foley catheter  Disposition: Home  Patient Instructions:    Medication List         ADVAIR DISKUS 250-50 MCG/DOSE Aepb  Generic drug:  Fluticasone-Salmeterol  Inhale 1 puff into the lungs every 12 (twelve) hours.     FLOMAX 0.4 MG Caps capsule  Generic drug:  tamsulosin  Take 0.4 mg by mouth daily.     HYDROcodone-acetaminophen 5-325 MG per tablet  Commonly known as:  NORCO/VICODIN  Take 1 tablet by mouth every 6 (six) hours as needed (pain).     HYDROcodone-acetaminophen 5-325 MG per tablet  Commonly known as:  NORCO  Take 1 tablet by mouth every 4 (four) hours as needed for moderate pain.     OVER THE COUNTER MEDICATION  Take 1 application by mouth daily. Vitamin E oil for wound care     tiotropium 18 MCG inhalation capsule  Commonly known as:  SPIRIVA  Place 18 mcg into inhaler and inhale daily.     trolamine salicylate 10 % cream  Commonly known as:  ASPERCREME  Apply 1 application topically at bedtime as needed (pain).  Activity: ambulate in house Diet: low fat, low cholesterol diet Wound Care: none needed  Follow-up:  With Dr. Zella Richer in 3 weeks.  Signed: Edsel Petrin. Dalbert Batman, M.D., FACS General and minimally invasive surgery Breast and Colorectal Surgery  06/10/2014, 6:32 AM

## 2014-06-10 NOTE — Plan of Care (Signed)
Problem: Phase I Progression Outcomes Goal: Voiding-avoid urinary catheter unless indicated Outcome: Not Met (add Reason) Patient going home with coude catheter. History of BPH , on flomax. Will follow up with urologist.

## 2014-06-10 NOTE — Progress Notes (Signed)
Discharge instructions gone over with patient and patient's son. Home medications gone over.pain prescription given.  Follow up appointments to be made with surgeon and urologist. Symptoms of infection gone over and what to do gone over. Diet, incisional care, activity, and good bowel regimen discussed. Patient is going home with coude catheter and leg bag was applied. Clean urinary bag for night use given. Patient and son demonstrated how to empty either collection bag and how to hook up collection bag for night use. Patient had a skin tear from tape to left arm from removal of iv site. Tegaderm was applied. Patient and son verbalized understanding of instructions.

## 2014-06-11 ENCOUNTER — Encounter (HOSPITAL_COMMUNITY): Payer: Self-pay | Admitting: General Surgery

## 2014-06-15 ENCOUNTER — Emergency Department (HOSPITAL_COMMUNITY)
Admission: EM | Admit: 2014-06-15 | Discharge: 2014-06-16 | Disposition: A | Discharge status: Home / Self Care | Payer: Medicare Other | Attending: Emergency Medicine | Admitting: Emergency Medicine

## 2014-06-15 ENCOUNTER — Emergency Department (HOSPITAL_COMMUNITY): Payer: Medicare Other

## 2014-06-15 ENCOUNTER — Encounter (HOSPITAL_COMMUNITY): Payer: Self-pay | Admitting: Emergency Medicine

## 2014-06-15 DIAGNOSIS — Z9089 Acquired absence of other organs: Secondary | ICD-10-CM | POA: Diagnosis not present

## 2014-06-15 DIAGNOSIS — G8918 Other acute postprocedural pain: Secondary | ICD-10-CM | POA: Insufficient documentation

## 2014-06-15 DIAGNOSIS — D649 Anemia, unspecified: Secondary | ICD-10-CM | POA: Insufficient documentation

## 2014-06-15 DIAGNOSIS — K5900 Constipation, unspecified: Secondary | ICD-10-CM | POA: Diagnosis not present

## 2014-06-15 DIAGNOSIS — Z860100 Personal history of colon polyps, unspecified: Secondary | ICD-10-CM | POA: Insufficient documentation

## 2014-06-15 DIAGNOSIS — N184 Chronic kidney disease, stage 4 (severe): Secondary | ICD-10-CM | POA: Diagnosis not present

## 2014-06-15 DIAGNOSIS — Z87448 Personal history of other diseases of urinary system: Secondary | ICD-10-CM | POA: Insufficient documentation

## 2014-06-15 DIAGNOSIS — Z880 Allergy status to penicillin: Secondary | ICD-10-CM | POA: Insufficient documentation

## 2014-06-15 DIAGNOSIS — R109 Unspecified abdominal pain: Secondary | ICD-10-CM | POA: Diagnosis present

## 2014-06-15 DIAGNOSIS — IMO0002 Reserved for concepts with insufficient information to code with codable children: Secondary | ICD-10-CM | POA: Insufficient documentation

## 2014-06-15 DIAGNOSIS — J4489 Other specified chronic obstructive pulmonary disease: Secondary | ICD-10-CM | POA: Insufficient documentation

## 2014-06-15 DIAGNOSIS — Z85118 Personal history of other malignant neoplasm of bronchus and lung: Secondary | ICD-10-CM | POA: Diagnosis not present

## 2014-06-15 DIAGNOSIS — Y838 Other surgical procedures as the cause of abnormal reaction of the patient, or of later complication, without mention of misadventure at the time of the procedure: Secondary | ICD-10-CM | POA: Insufficient documentation

## 2014-06-15 DIAGNOSIS — Z79899 Other long term (current) drug therapy: Secondary | ICD-10-CM | POA: Diagnosis not present

## 2014-06-15 DIAGNOSIS — S301XXA Contusion of abdominal wall, initial encounter: Secondary | ICD-10-CM

## 2014-06-15 DIAGNOSIS — Z87891 Personal history of nicotine dependence: Secondary | ICD-10-CM | POA: Diagnosis not present

## 2014-06-15 DIAGNOSIS — J449 Chronic obstructive pulmonary disease, unspecified: Secondary | ICD-10-CM | POA: Insufficient documentation

## 2014-06-15 DIAGNOSIS — Z9889 Other specified postprocedural states: Secondary | ICD-10-CM | POA: Diagnosis not present

## 2014-06-15 LAB — CBC WITH DIFFERENTIAL/PLATELET
Band Neutrophils: 0 % (ref 0–10)
Basophils Absolute: 0 10*3/uL (ref 0.0–0.1)
Basophils Relative: 0 % (ref 0–1)
Eosinophils Absolute: 0.4 10*3/uL (ref 0.0–0.7)
Eosinophils Relative: 6 % — ABNORMAL HIGH (ref 0–5)
HEMATOCRIT: 34.1 % — AB (ref 39.0–52.0)
HEMOGLOBIN: 11.3 g/dL — AB (ref 13.0–17.0)
LYMPHS ABS: 1 10*3/uL (ref 0.7–4.0)
Lymphocytes Relative: 17 % (ref 12–46)
MCH: 31.1 pg (ref 26.0–34.0)
MCHC: 33.1 g/dL (ref 30.0–36.0)
MCV: 93.9 fL (ref 78.0–100.0)
MONOS PCT: 6 % (ref 3–12)
Monocytes Absolute: 0.4 10*3/uL (ref 0.1–1.0)
NEUTROS ABS: 4.2 10*3/uL (ref 1.7–7.7)
Neutrophils Relative %: 70 % (ref 43–77)
Platelets: 202 10*3/uL (ref 150–400)
RBC: 3.63 MIL/uL — ABNORMAL LOW (ref 4.22–5.81)
RDW: 14 % (ref 11.5–15.5)
WBC: 5.9 10*3/uL (ref 4.0–10.5)

## 2014-06-15 LAB — COMPREHENSIVE METABOLIC PANEL
ALK PHOS: 72 U/L (ref 39–117)
ALT: 9 U/L (ref 0–53)
AST: 14 U/L (ref 0–37)
Albumin: 3.1 g/dL — ABNORMAL LOW (ref 3.5–5.2)
Anion gap: 9 (ref 5–15)
BILIRUBIN TOTAL: 0.4 mg/dL (ref 0.3–1.2)
BUN: 28 mg/dL — ABNORMAL HIGH (ref 6–23)
CO2: 28 meq/L (ref 19–32)
Calcium: 8.8 mg/dL (ref 8.4–10.5)
Chloride: 104 mEq/L (ref 96–112)
Creatinine, Ser: 1.62 mg/dL — ABNORMAL HIGH (ref 0.50–1.35)
GFR calc Af Amer: 42 mL/min — ABNORMAL LOW (ref >90)
GFR, EST NON AFRICAN AMERICAN: 36 mL/min — AB (ref >90)
GLUCOSE: 109 mg/dL — AB (ref 70–99)
POTASSIUM: 4.5 meq/L (ref 3.7–5.3)
SODIUM: 141 meq/L (ref 137–147)
Total Protein: 6.1 g/dL (ref 6.0–8.3)

## 2014-06-15 MED ORDER — SODIUM CHLORIDE 0.9 % IV BOLUS (SEPSIS)
500.0000 mL | Freq: Once | INTRAVENOUS | Status: AC
  Administered 2014-06-16: 500 mL via INTRAVENOUS

## 2014-06-15 MED ORDER — MORPHINE SULFATE 4 MG/ML IJ SOLN
4.0000 mg | Freq: Once | INTRAMUSCULAR | Status: AC
  Administered 2014-06-16: 4 mg via INTRAVENOUS
  Filled 2014-06-15: qty 1

## 2014-06-15 NOTE — ED Provider Notes (Signed)
CSN: [id number]     Arrival date & time 06/15/14  2152 History   First MD Initiated Contact with Patient 06/15/14 2301   This chart was scribed for Mariea Clonts, MD by Rosary Lively, ED scribe. This patient was seen in room APA02/APA02 and the patient's care was started at 11:34 PM.    Chief Complaint  Patient presents with  . Abdominal Pain   The history is provided by the patient. No language interpreter was used.   HPI Comments:  Paul Gray is a 80 y.o. male who presents to the Emergency Department complaining of abdominal pain. Pt had a hernia surgery one week ago, and pt reports that it is painful and swollen around the incision. Son reports that pt has not had a bowel movement in several days, and pt is not taking a stool softener. Son states that pt has a temporary catheter placed. Son reports that pt has COPD, and had his appendix removed. Son reports that surgery was performed at [facility], and pt was hospitalized for 2 days.    Past Medical History  Diagnosis Date  . Lung cancer   . Back pain   . BPH (benign prostatic hyperplasia)   . S/P colonoscopy 2002, 2007    Hyperplastic polyps 2002; pancolonic diverticula 2007  . Hx of adenomatous colonic polyps   . COPD (chronic obstructive pulmonary disease)   . CKD (chronic kidney disease) stage 4, GFR 15-29 ml/min     Creatinine 2.0  . Shortness of breath    Past Surgical History  Procedure Laterality Date  . Hernia surgery x 3      Inguinal hernia repair  . Cholecystectomy    . Appendectomy    . Radiofrequency ablation for lung cancer    . Eye surgery      cataracts removed with lens implant  . Hernia repair  06/08/2014    WITH MESH  . Inguinal hernia repair Right 06/08/2014    Procedure: RIGHT INGUINAL HERNIA REPAIR WITH MESH;  Surgeon: Jackolyn Confer, MD;  Location: [place];  Service: General;  Laterality: Right;  . Insertion of mesh Right 06/08/2014    Procedure: INSERTION OF MESH;  Surgeon: Jackolyn Confer, MD;   Location: [place];  Service: General;  Laterality: Right;   Family History  Problem Relation Age of Onset  . CAD Brother     Not premature  . Heart failure Father     Died in his 83s   History  Substance Use Topics  . Smoking status: Former Smoker -- 1.00 packs/day for 60 years    Types: Cigarettes    Quit date: 09/22/2003  . Smokeless tobacco: Never Used     Comment: quit 6 years ago, about 1 ppd X 60+ years  . Alcohol Use: No    Review of Systems  Gastrointestinal: Positive for abdominal pain and constipation.  All other systems reviewed and are negative.   Allergies  Penicillins  Home Medications   Prior to Admission medications   Medication Sig Start Date End Date Taking? Authorizing Provider  Fluticasone-Salmeterol (ADVAIR DISKUS) 250-50 MCG/DOSE AEPB Inhale 1 puff into the lungs every 12 (twelve) hours.    Yes Historical Provider, MD  HYDROcodone-acetaminophen (NORCO) 5-325 MG per tablet Take 1 tablet by mouth every 4 (four) hours as needed for moderate pain. 06/08/14  Yes Jackolyn Confer, MD  OVER THE COUNTER MEDICATION Take 1 application by mouth daily. Vitamin E oil for wound care   Yes Historical  Provider, MD  Tamsulosin HCl (FLOMAX) 0.4 MG CAPS Take 0.4 mg by mouth daily.    Yes Historical Provider, MD  tiotropium (SPIRIVA) 18 MCG inhalation capsule Place 18 mcg into inhaler and inhale daily.    Yes Historical Provider, MD  trolamine salicylate (ASPERCREME) 10 % cream Apply 1 application topically at bedtime as needed (pain).   Yes Historical Provider, MD   BP 148/86  Pulse 112  Temp(Src) 97.9 F (36.6 C) (Oral)  Resp 18  Ht 5\' 9"  (1.753 m)  Wt 160 lb (72.576 kg)  BMI 23.62 kg/m2  SpO2 95% Physical Exam  Nursing note and vitals reviewed. Constitutional: He is oriented to person, place, and time. He appears well-developed and well-nourished.  HENT:  Head: Normocephalic and atraumatic.  Eyes: EOM are normal. No scleral icterus.  Neck: Normal range of motion.  Neck supple.  Cardiovascular: Normal rate and regular rhythm.   No significant leg swelling.   Pulmonary/Chest: Effort normal and breath sounds normal. No respiratory distress. He has no wheezes. He has no rales.  Abdominal: Bowel sounds are normal. There is tenderness.  Pt has a right inguinal incision post op. There is ecchymosis from suprapubic to right groin. Mild tenderness. Does not appear infected.  Musculoskeletal: Normal range of motion.  Neurological: He is alert and oriented to person, place, and time.  Skin: Skin is warm and dry.  Psychiatric: He has a normal mood and affect. His behavior is normal.    ED Course  Procedures  DIAGNOSTIC STUDIES: Oxygen Saturation is 95% on RA, normal by my interpretation.  COORDINATION OF CARE: 11:44 PM-Discussed treatment plan   Labs Review Labs Reviewed  CBC WITH DIFFERENTIAL - Abnormal; Notable for the following:    RBC 3.63 (*)    Hemoglobin 11.3 (*)    HCT 34.1 (*)    Eosinophils Relative 6 (*)    All other components within normal limits  COMPREHENSIVE METABOLIC PANEL - Abnormal; Notable for the following:    Glucose, Bld 109 (*)    BUN 28 (*)    Creatinine, Ser 1.62 (*)    Albumin 3.1 (*)    GFR calc non Af Amer 36 (*)    GFR calc Af Amer 42 (*)    All other components within normal limits  URINALYSIS, ROUTINE W REFLEX MICROSCOPIC - Abnormal; Notable for the following:    Specific Gravity, Urine <1.005 (*)    Hgb urine dipstick MODERATE (*)    Leukocytes, UA TRACE (*)    All other components within normal limits  URINE MICROSCOPIC-ADD ON    Imaging Review No results found.   EKG Interpretation None      MDM   Final diagnoses:  Post-op pain  Hematoma of abdominal wall, initial encounter   anemia  I personally performed the services described in this documentation, which was scribed in my presence. The recorded information has been reviewed and is accurate.  Patient presents with differential postop  hematoma versus abscess. CT concerning for abscess. Patient has no white blood cell count, surgical incision clear, no signs of external infection, no fever. Pain improved in ER. Discussed the case with Dr. Arlyss Gandy who reviewed the CT scan and feels most likely hematoma and will see patient early next week. Pain meds and stool softeners for outpatient. Results and differential diagnosis were discussed with the patient/parent/guardian. Close follow up outpatient was discussed, comfortable with the plan.   Medications  morphine 4 MG/ML injection 4 mg (4 mg Intravenous  Given 06/16/14 0038)  sodium chloride 0.9 % bolus 500 mL (500 mLs Intravenous New Bag/Given 06/16/14 0038)    Filed Vitals:   06/15/14 2200 06/15/14 2230 06/15/14 2300 06/16/14 0146  BP: 143/73 133/72 124/60 143/68  Pulse: 71 70 72 66  Temp:    97.9 F (36.6 C)  TempSrc:    Oral  Resp:    16  Height:      Weight:      SpO2: 94% 95% 95% 97%    Final diagnoses:  Post-op pain  Hematoma of abdominal wall, initial encounter      Mariea Clonts, MD 06/16/14 [phone]

## 2014-06-15 NOTE — ED Notes (Signed)
Dr Zavitz at bedside  

## 2014-06-15 NOTE — ED Notes (Signed)
Patient c/o RLQ pain, per EMS.  Patient had urinary catheter in place and it was removed, patient told EMS that it was removed and replaced.

## 2014-06-16 ENCOUNTER — Emergency Department (HOSPITAL_COMMUNITY): Payer: Medicare Other

## 2014-06-16 DIAGNOSIS — IMO0002 Reserved for concepts with insufficient information to code with codable children: Secondary | ICD-10-CM | POA: Diagnosis not present

## 2014-06-16 LAB — URINALYSIS, ROUTINE W REFLEX MICROSCOPIC
Bilirubin Urine: NEGATIVE
Glucose, UA: NEGATIVE mg/dL
Ketones, ur: NEGATIVE mg/dL
NITRITE: NEGATIVE
PROTEIN: NEGATIVE mg/dL
Specific Gravity, Urine: <1.005 — ABNORMAL LOW (ref 1.005–1.030)
UROBILINOGEN UA: 0.2 mg/dL (ref 0.0–1.0)
pH: 6.5 (ref 5.0–8.0)

## 2014-06-16 LAB — URINE MICROSCOPIC-ADD ON

## 2014-06-16 MED ORDER — HYDROCODONE-ACETAMINOPHEN 5-325 MG PO TABS: 2.0000 | ORAL_TABLET | ORAL | Status: DC | PRN

## 2014-06-16 MED ORDER — LEVOFLOXACIN IN D5W 750 MG/150ML IV SOLN
750.0000 mg | Freq: Once | INTRAVENOUS | Status: DC
  Administered 2014-06-16: 750 mg via INTRAVENOUS
  Filled 2014-06-16: qty 150

## 2014-06-16 MED ORDER — DOCUSATE SODIUM 100 MG PO CAPS: 100.0000 mg | ORAL_CAPSULE | Freq: Two times a day (BID) | ORAL | Status: DC

## 2014-06-16 MED ORDER — METRONIDAZOLE IN NACL 5-0.79 MG/ML-% IV SOLN: 500.0000 mg | Freq: Once | INTRAVENOUS | Status: DC

## 2014-06-16 NOTE — ED Notes (Signed)
Urine collected from catheterized leg bag placed previous to tonight visit.

## 2014-06-16 NOTE — Discharge Instructions (Signed)
See general surgery Monday or Tuesday. Use ice as needed for swelling and pain. Take stool softeners regularly to keep stools regular.  For severe pain take norco or vicodin however realize they have the potential for addiction and it can make you sleepy and has tylenol in it.  No operating machinery while taking. Take tylenol every 4 hours as needed (15 mg per kg) and take motrin (ibuprofen) every 6 hours as needed for fever or pain (10 mg per kg). Return for any changes, weird rashes, neck stiffness, change in behavior, new or worsening concerns.  Follow up with your physician as directed. Thank you Filed Vitals:   06/15/14 2200 06/15/14 2230 06/15/14 2300 06/16/14 0146  BP: 143/73 133/72 124/60 143/68  Pulse: 71 70 72 66  Temp:    97.9 F (36.6 C)  TempSrc:    Oral  Resp:    16  Height:      Weight:      SpO2: 94% 95% 95% 97%

## 2014-06-25 ENCOUNTER — Telehealth (INDEPENDENT_AMBULATORY_CARE_PROVIDER_SITE_OTHER): Payer: Self-pay

## 2014-06-25 NOTE — Telephone Encounter (Signed)
Delilah Shan, RN at [facility], reports that pt had urinary catheter removed 10/01.  He could not void by 8 pm, so home care had to be contacted to put foley back in.  They thought the order read to put catherter back in, but the progress note said "may need to be replaced."  Can you give order for 10/01 that catheter is to be replaced if pt unable to void? Thanks. Sunday Spillers  This note will also be put in Epic

## 2014-06-25 NOTE — Telephone Encounter (Signed)
May have that order.

## 2014-07-05 ENCOUNTER — Other Ambulatory Visit: Payer: Self-pay | Admitting: Urology

## 2014-07-06 ENCOUNTER — Other Ambulatory Visit (HOSPITAL_COMMUNITY): Payer: Self-pay | Admitting: Anesthesiology

## 2014-07-06 ENCOUNTER — Encounter (HOSPITAL_COMMUNITY): Payer: Self-pay | Admitting: *Deleted

## 2014-07-06 NOTE — Progress Notes (Addendum)
[place]  07/06/2014   Your procedure is scheduled on: Thurday October 29th, 2015  Report to [facility]   Entrance and follow signs to  [place] at [place] is to bring patient  Call this number if you have problems the morning of surgery [phone]   Remember: [place] not eat food or drink liquids :After Midnight.     Take these medicines the morning of surgery with A SIP OF WATER: Spiriva, Advair                               You may not have any metal on your body including hair pins and piercings  Do not wear jewelry, make-up, lotions, powders, or deodorant.   Men may shave face and neck.  Do not bring valuables to the hospital. [place].  Contacts, dentures or bridgework may not be worn into surgery.  Leave suitcase in the car. After surgery it may be brought to your room.  For patients admitted to the hospital, checkout time is 11:00 AM the day of discharge.   ________________________________________________________________________  [facility] - Preparing for Surgery Before surgery, you can play an important role.  Because skin is not sterile, your skin needs to be as free of germs as possible.  You can reduce the number of germs on your skin by washing with CHG (chlorahexidine gluconate) soap before surgery.  CHG is an antiseptic cleaner which kills germs and bonds with the skin to continue killing germs even after washing. Please DO NOT use if you have an allergy to CHG or antibacterial soaps.  If your skin becomes reddened/irritated stop using the CHG and inform your nurse when you arrive at Short Stay. Do not shave (including legs and underarms) for at least 48 hours prior to the first CHG shower.  You may shave your face/neck. Please follow these instructions carefully:  1.  Shower with CHG Soap the night before  surgery and the  morning of Surgery.  2.  If you choose to wash your hair, wash your hair first as usual with your  normal  shampoo.  3.  After you shampoo, rinse your hair and body thoroughly to remove the  shampoo.                           4.  Use CHG as you would any other liquid soap.  You can apply chg directly  to the skin and wash                       Gently with a scrungie or clean washcloth.  5.  Apply the CHG Soap to your body ONLY FROM THE NECK DOWN.   Do not use on face/ open                           Wound or open sores. Avoid contact with eyes, ears mouth and genitals (private parts).                       Wash face,  Genitals (private parts) with your normal soap.  6.  Wash thoroughly, paying special attention to the area where your surgery  will be performed.  7.  Thoroughly rinse your body with warm water from the neck down.  8.  DO NOT shower/wash with your normal soap after using and rinsing off  the CHG Soap.                9.  Pat yourself dry with a clean towel.            10.  Wear clean pajamas.            11.  Place clean sheets on your bed the night of your first shower and do not  sleep with pets. Day of Surgery : Do not apply any lotions/deodorants the morning of surgery.  Please wear clean clothes to the hospital/surgery center.  FAILURE TO FOLLOW THESE INSTRUCTIONS MAY RESULT IN THE CANCELLATION OF YOUR SURGERY

## 2014-07-06 NOTE — Progress Notes (Signed)
FAXED ALL PRE OP INSTRUCTIONS TO [place], RN [place] [phone], SPOKE WITH TAMMY HALE BY PHONE, SHE  WILL REVIEW INSTRUCTIONS AND CALL IF QUESTIONS. LEFT PAM GIBSON MESSAGE PLEASE HAVE DR WRENN WRITE PRESCRIPTION FOR HIBICLENS SHOWER NIGHT BEFORE SURGERY AND MORNING OF SURGERY AND FAX TO [phone]. SPOKE WITH POA SON Adrain HALE, HE WILL BRING PATIENT DAY OF PROCEDURE AND ARRIVE 630 AM [facility] ENTRANCE AND FOLLOW SIGNS  TO  SHORT STAY

## 2014-07-06 NOTE — Progress Notes (Signed)
SPOKE WITH PAM GIBSON, SHE WILL FAX PRESCRITION TO [phone] FOR HIBICLENS SHOWER NIGHT BEFORE SURGERY AND MORNING OF SURGERY

## 2014-07-09 ENCOUNTER — Encounter (HOSPITAL_COMMUNITY): Payer: Self-pay | Admitting: Pharmacy Technician

## 2014-07-18 NOTE — H&P (Signed)
Active Problems Problems  1. Abdominal pain, suprapubic (R10.30) 2. Benign prostatic hyperplasia with urinary obstruction (N40.1) 3. 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 Elevated prostate specific antigen (PSA) (R97.2)   Prostate Bx. neg. 9/07, PSA was 6.56 5. Incomplete bladder emptying (R33.9) 6. Phimosis (N47.1) 7. 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 Urinary tract infection (N39.0)  History of Present Illness [name] returns today in f/u. He had a voiding trial last week and we thought he was going to get by but he went back into retention with a 678ml PVR. He has the foley back in.  He had a postive culture at [facility] and is on bactrim. He is having pain and burning with the foley.   Past Medical History Problems  1. History of Chronic obstructive pulmonary disease (J44.9) 2. History of low back pain (Z87.39) 3. History of Idiopathic peripheral neuropathy (G60.9) 4. History of Lung Cancer 5. History of Urinary retention (R33.9) 6. History of Urinary retention (R33.9)  Surgical History Problems  1. History of Biopsy Lung Percutaneous 2. History of Biopsy Of The Prostate Needle 3. History of Cholecystectomy 4. History of Inguinal Hernia Repair  Current Meds 1. Advair Diskus MISC;  Therapy: (Recorded:24Sep2015) to Recorded 2. Finasteride 5 MG Oral Tablet; TAKE 1 TABLET DAILY AS DIRECTED;  Therapy: 07Oct2015 to (Evaluate:01Oct2016); Last Rx:07Oct2015 Ordered 3. Hydrocodone-Acetaminophen 5-325 MG Oral Tablet;  Therapy: [id number] to Recorded 4. Spiriva HandiHaler 18 MCG Inhalation Capsule;  Therapy: (Recorded:12Aug2009) to Recorded 5. Sulfamethoxazole-Trimethoprim 800-160 MG Oral Tablet; Take 1/2 tablet twice daily for a  week;  Therapy: [id number] to (Last Rx:12Oct2015)  Requested for: 13Oct2015 Ordered 6. Tamsulosin HCl - 0.4 MG Oral Capsule; TAKE 1 CAPSULE AT BEDTIME;  Therapy: [id number] to (Evaluate:04Jul2016)  Requested for: [id number]; Last  Rx:10Jul2015  Ordered  Allergies Medication  1. Penicillins  Family History Problems  1. Family history of Death of family member : Father  Social History Problems  1. Denied: History of Alcohol Use 2. Former smoker [phone]) 3. Marital History - Currently Married  Past and social history reviewed and updated.   Review of Systems  Gastrointestinal: no diarrhea and no constipation.  Cardiovascular: no chest pain and no leg swelling.  Respiratory: no shortness of breath.    Physical Exam Constitutional: Well nourished and well developed . No acute distress.  Pulmonary: No respiratory distress and normal respiratory rhythm and effort.  Cardiovascular: Heart rate and rhythm are normal . No peripheral edema.    Results/Data Selected Results  URINE CULTURE 07Oct2015 11:06AM Irine Seal  SOURCE : CATHED SPECIMEN TYPE: CATH URINE   Test Name Result Flag Reference  CULTURE, URINE Culture, Urine    ===== COLONY COUNT: =====  >=100,000 COLONIES/ML   FINAL REPORT: KLEBSIELLA OXYTOCA   SENSITIVITY FOR: KLEBSIELLA OXYTOCA    AMPICILLIN                             RESISTANT       >=32    AMOX/CLAVULANIC                        SENSITIVE        <=2    AMPICILLIN/SUL                         SENSITIVE          8    PIPERACILLIN/TAZO  SENSITIVE        <=4    IMIPENEM                               SENSITIVE     <=0.25    CEFAZOLIN                              SENSITIVE          8    CEFTRIAXONE                            SENSITIVE        <=1    CEFTAZIDIME                            SENSITIVE        <=1    CEFEPIME                               SENSITIVE        <=1    GENTAMICIN                             SENSITIVE        <=1    TOBRAMYCIN                             SENSITIVE        <=1    CIPROFLOXACIN                          SENSITIVE     <=0.25    LEVOFLOXACIN                           SENSITIVE     <=0.12    NITROFURANTOIN                         SENSITIVE         32     TRIMETH/SULFA                          SENSITIVE       <=20    END OF REPORT   Assessment Assessed  1. Benign prostatic hyperplasia with urinary obstruction (N40.1) 2. Incomplete bladder emptying (R33.9) 3. Urinary tract infection (N39.0)  He is back in retention and wants to get something done.  He had a positive culture but is on bactrim.   Plan Benign prostatic hyperplasia with urinary obstruction  1. Stop: Tamsulosin HCl - 0.4 MG Oral Capsule Incomplete bladder emptying  2. Follow-up Schedule Surgery Office  Follow-up  Status: Hold For - Appointment   Requested for: [phone]  I discussed the options for treatment including more time on the finasteride, CTT, laser prostatectomy and TURP.  Based on his desire and the prostate size on fluoroscopy, I have recommended a TURP and will get that set up ASAP.  I reviewed the risks of bleeding, infection, incontinence, stricture, persistent retention, fluid overload, thrombotic events and anesthetic risks.   Discussion/Summary  CC: Dr. Arlyce Harman and Dr. Jackolyn Confer.

## 2014-07-19 ENCOUNTER — Encounter (HOSPITAL_COMMUNITY)
Admission: RE | Disposition: A | Discharge status: Home / Self Care | Payer: Self-pay | Source: Ambulatory Visit | Attending: Urology

## 2014-07-19 ENCOUNTER — Encounter (HOSPITAL_COMMUNITY): Payer: Self-pay | Admitting: *Deleted

## 2014-07-19 ENCOUNTER — Encounter (HOSPITAL_COMMUNITY): Payer: Medicare Other | Admitting: Anesthesiology

## 2014-07-19 ENCOUNTER — Observation Stay (HOSPITAL_COMMUNITY)
Admission: RE | Admit: 2014-07-19 | Discharge: 2014-07-20 | Disposition: A | Discharge status: Home / Self Care | Payer: Medicare Other | Source: Ambulatory Visit | Attending: Urology | Admitting: Urology

## 2014-07-19 ENCOUNTER — Ambulatory Visit (HOSPITAL_COMMUNITY): Payer: Medicare Other | Admitting: Anesthesiology

## 2014-07-19 DIAGNOSIS — N323 Diverticulum of bladder: Secondary | ICD-10-CM | POA: Diagnosis not present

## 2014-07-19 DIAGNOSIS — G609 Hereditary and idiopathic neuropathy, unspecified: Secondary | ICD-10-CM | POA: Diagnosis not present

## 2014-07-19 DIAGNOSIS — Z85118 Personal history of other malignant neoplasm of bronchus and lung: Secondary | ICD-10-CM | POA: Diagnosis not present

## 2014-07-19 DIAGNOSIS — N210 Calculus in bladder: Secondary | ICD-10-CM | POA: Diagnosis present

## 2014-07-19 DIAGNOSIS — Z23 Encounter for immunization: Secondary | ICD-10-CM | POA: Diagnosis not present

## 2014-07-19 DIAGNOSIS — Z79899 Other long term (current) drug therapy: Secondary | ICD-10-CM | POA: Insufficient documentation

## 2014-07-19 DIAGNOSIS — N401 Enlarged prostate with lower urinary tract symptoms: Principal | ICD-10-CM | POA: Diagnosis present

## 2014-07-19 DIAGNOSIS — Z86010 Personal history of colonic polyps: Secondary | ICD-10-CM | POA: Insufficient documentation

## 2014-07-19 DIAGNOSIS — Z880 Allergy status to penicillin: Secondary | ICD-10-CM | POA: Insufficient documentation

## 2014-07-19 DIAGNOSIS — M545 Low back pain: Secondary | ICD-10-CM | POA: Insufficient documentation

## 2014-07-19 DIAGNOSIS — J449 Chronic obstructive pulmonary disease, unspecified: Secondary | ICD-10-CM | POA: Insufficient documentation

## 2014-07-19 DIAGNOSIS — N184 Chronic kidney disease, stage 4 (severe): Secondary | ICD-10-CM | POA: Diagnosis not present

## 2014-07-19 DIAGNOSIS — N390 Urinary tract infection, site not specified: Secondary | ICD-10-CM | POA: Insufficient documentation

## 2014-07-19 DIAGNOSIS — Z87891 Personal history of nicotine dependence: Secondary | ICD-10-CM | POA: Insufficient documentation

## 2014-07-19 DIAGNOSIS — R338 Other retention of urine: Secondary | ICD-10-CM | POA: Diagnosis not present

## 2014-07-19 HISTORY — PX: TRANSURETHRAL RESECTION OF PROSTATE: SHX73

## 2014-07-19 LAB — BASIC METABOLIC PANEL
Anion gap: 11 (ref 5–15)
BUN: 31 mg/dL — AB (ref 6–23)
CALCIUM: 9.1 mg/dL (ref 8.4–10.5)
CO2: 26 meq/L (ref 19–32)
CREATININE: 1.7 mg/dL — AB (ref 0.50–1.35)
Chloride: 94 mEq/L — ABNORMAL LOW (ref 96–112)
GFR calc Af Amer: 39 mL/min — ABNORMAL LOW (ref >90)
GFR calc non Af Amer: 34 mL/min — ABNORMAL LOW (ref >90)
Glucose, Bld: 113 mg/dL — ABNORMAL HIGH (ref 70–99)
Potassium: 4.8 mEq/L (ref 3.7–5.3)
Sodium: 131 mEq/L — ABNORMAL LOW (ref 137–147)

## 2014-07-19 LAB — CBC
HCT: 37.4 % — ABNORMAL LOW (ref 39.0–52.0)
Hemoglobin: 11.9 g/dL — ABNORMAL LOW (ref 13.0–17.0)
MCH: 30.1 pg (ref 26.0–34.0)
MCHC: 31.8 g/dL (ref 30.0–36.0)
MCV: 94.7 fL (ref 78.0–100.0)
Platelets: 205 10*3/uL (ref 150–400)
RBC: 3.95 MIL/uL — ABNORMAL LOW (ref 4.22–5.81)
RDW: 13.1 % (ref 11.5–15.5)
WBC: 7.8 10*3/uL (ref 4.0–10.5)

## 2014-07-19 LAB — MRSA PCR SCREENING: MRSA BY PCR: POSITIVE — AB

## 2014-07-19 SURGERY — TRANSURETHRAL RESECTION OF THE PROSTATE WITH GYRUS INSTRUMENTS
Anesthesia: General

## 2014-07-19 MED ORDER — PHENYLEPHRINE 40 MCG/ML (10ML) SYRINGE FOR IV PUSH (FOR BLOOD PRESSURE SUPPORT)
PREFILLED_SYRINGE | INTRAVENOUS | Status: AC
  Filled 2014-07-19: qty 10

## 2014-07-19 MED ORDER — OXYCODONE HCL 5 MG/5ML PO SOLN
5.0000 mg | Freq: Once | ORAL | Status: DC | PRN
Start: 2014-07-19 — End: 2014-07-19
  Filled 2014-07-19: qty 5

## 2014-07-19 MED ORDER — CIPROFLOXACIN HCL 250 MG PO TABS
250.0000 mg | ORAL_TABLET | Freq: Two times a day (BID) | ORAL | Status: DC
  Administered 2014-07-19 – 2014-07-20 (×2): 250 mg via ORAL
  Filled 2014-07-19 (×5): qty 1

## 2014-07-19 MED ORDER — TIOTROPIUM BROMIDE MONOHYDRATE 18 MCG IN CAPS
18.0000 ug | ORAL_CAPSULE | Freq: Every day | RESPIRATORY_TRACT | Status: DC
  Administered 2014-07-20: 18 ug via RESPIRATORY_TRACT
  Filled 2014-07-19: qty 5

## 2014-07-19 MED ORDER — SODIUM CHLORIDE 0.9 % IR SOLN
Status: DC | PRN
  Administered 2014-07-19: 18000 mL

## 2014-07-19 MED ORDER — ZOLPIDEM TARTRATE 5 MG PO TABS: 5.0000 mg | ORAL_TABLET | Freq: Every evening | ORAL | Status: DC | PRN

## 2014-07-19 MED ORDER — PNEUMOCOCCAL VAC POLYVALENT 25 MCG/0.5ML IJ INJ
0.5000 mL | INJECTION | INTRAMUSCULAR | Status: AC
  Administered 2014-07-20: 0.5 mL via INTRAMUSCULAR
  Filled 2014-07-19 (×2): qty 0.5

## 2014-07-19 MED ORDER — HYDROCODONE-ACETAMINOPHEN 5-325 MG PO TABS: 1.0000 | ORAL_TABLET | ORAL | Status: DC | PRN

## 2014-07-19 MED ORDER — LACTATED RINGERS IV SOLN
INTRAVENOUS | Status: DC
  Administered 2014-07-19 (×2): 1000 mL via INTRAVENOUS

## 2014-07-19 MED ORDER — DOCUSATE SODIUM 100 MG PO CAPS
100.0000 mg | ORAL_CAPSULE | Freq: Two times a day (BID) | ORAL | Status: DC
  Administered 2014-07-19 (×2): 100 mg via ORAL
  Filled 2014-07-19 (×4): qty 1

## 2014-07-19 MED ORDER — PHENYLEPHRINE HCL 10 MG/ML IJ SOLN
INTRAMUSCULAR | Status: DC | PRN
  Administered 2014-07-19: 120 ug via INTRAVENOUS

## 2014-07-19 MED ORDER — INFLUENZA VAC SPLIT QUAD 0.5 ML IM SUSY
0.5000 mL | PREFILLED_SYRINGE | INTRAMUSCULAR | Status: AC
  Administered 2014-07-20: 0.5 mL via INTRAMUSCULAR
  Filled 2014-07-19 (×2): qty 0.5

## 2014-07-19 MED ORDER — PROMETHAZINE HCL 25 MG/ML IJ SOLN: 6.2500 mg | INTRAMUSCULAR | Status: DC | PRN

## 2014-07-19 MED ORDER — MOMETASONE FURO-FORMOTEROL FUM 100-5 MCG/ACT IN AERO
2.0000 | INHALATION_SPRAY | Freq: Two times a day (BID) | RESPIRATORY_TRACT | Status: DC
  Administered 2014-07-19 – 2014-07-20 (×2): 2 via RESPIRATORY_TRACT
  Filled 2014-07-19: qty 8.8

## 2014-07-19 MED ORDER — FENTANYL CITRATE 0.05 MG/ML IJ SOLN
INTRAMUSCULAR | Status: AC
  Filled 2014-07-19: qty 2

## 2014-07-19 MED ORDER — OXYCODONE HCL 5 MG PO TABS: 5.0000 mg | ORAL_TABLET | Freq: Once | ORAL | Status: DC | PRN

## 2014-07-19 MED ORDER — FENTANYL CITRATE 0.05 MG/ML IJ SOLN
INTRAMUSCULAR | Status: DC | PRN
  Administered 2014-07-19 (×3): 25 ug via INTRAVENOUS

## 2014-07-19 MED ORDER — BISACODYL 10 MG RE SUPP: 10.0000 mg | Freq: Every day | RECTAL | Status: DC | PRN

## 2014-07-19 MED ORDER — SODIUM CHLORIDE 0.9 % IR SOLN
3000.0000 mL | Status: DC
  Administered 2014-07-19: 3000 mL

## 2014-07-19 MED ORDER — ONDANSETRON HCL 4 MG/2ML IJ SOLN
INTRAMUSCULAR | Status: DC | PRN
  Administered 2014-07-19: 4 mg via INTRAVENOUS

## 2014-07-19 MED ORDER — CIPROFLOXACIN IN D5W 400 MG/200ML IV SOLN
400.0000 mg | INTRAVENOUS | Status: AC
  Administered 2014-07-19: 400 mg via INTRAVENOUS

## 2014-07-19 MED ORDER — HYDROMORPHONE HCL 1 MG/ML IJ SOLN
INTRAMUSCULAR | Status: AC
  Administered 2014-07-19: 11:00:00
  Filled 2014-07-19: qty 1

## 2014-07-19 MED ORDER — ONDANSETRON HCL 4 MG/2ML IJ SOLN
INTRAMUSCULAR | Status: AC
  Filled 2014-07-19: qty 2

## 2014-07-19 MED ORDER — CHLORHEXIDINE GLUCONATE CLOTH 2 % EX PADS
6.0000 | MEDICATED_PAD | Freq: Every day | CUTANEOUS | Status: DC
  Administered 2014-07-20: 6 via TOPICAL

## 2014-07-19 MED ORDER — HYDROMORPHONE HCL 1 MG/ML IJ SOLN: 0.5000 mg | INTRAMUSCULAR | Status: DC | PRN

## 2014-07-19 MED ORDER — CIPROFLOXACIN IN D5W 400 MG/200ML IV SOLN
INTRAVENOUS | Status: AC
  Filled 2014-07-19: qty 200

## 2014-07-19 MED ORDER — PROPOFOL 10 MG/ML IV BOLUS
INTRAVENOUS | Status: AC
  Filled 2014-07-19: qty 20

## 2014-07-19 MED ORDER — HYOSCYAMINE SULFATE 0.125 MG SL SUBL
0.1250 mg | SUBLINGUAL_TABLET | SUBLINGUAL | Status: DC | PRN
  Filled 2014-07-19: qty 1

## 2014-07-19 MED ORDER — KCL IN DEXTROSE-NACL 20-5-0.45 MEQ/L-%-% IV SOLN
INTRAVENOUS | Status: DC
  Administered 2014-07-19 – 2014-07-20 (×2): via INTRAVENOUS
  Filled 2014-07-19 (×3): qty 1000

## 2014-07-19 MED ORDER — ACETAMINOPHEN 325 MG PO TABS: 650.0000 mg | ORAL_TABLET | ORAL | Status: DC | PRN

## 2014-07-19 MED ORDER — ONDANSETRON HCL 4 MG/2ML IJ SOLN: 4.0000 mg | INTRAMUSCULAR | Status: DC | PRN

## 2014-07-19 MED ORDER — BELLADONNA ALKALOIDS-OPIUM 16.2-60 MG RE SUPP
1.0000 | Freq: Four times a day (QID) | RECTAL | Status: DC | PRN
  Administered 2014-07-19: 1 via RECTAL
  Filled 2014-07-19: qty 1

## 2014-07-19 MED ORDER — MUPIROCIN 2 % EX OINT
1.0000 "application " | TOPICAL_OINTMENT | Freq: Two times a day (BID) | CUTANEOUS | Status: DC
  Administered 2014-07-19 – 2014-07-20 (×2): 1 via NASAL
  Filled 2014-07-19: qty 22

## 2014-07-19 MED ORDER — DIPHENHYDRAMINE HCL 50 MG/ML IJ SOLN: 12.5000 mg | Freq: Four times a day (QID) | INTRAMUSCULAR | Status: DC | PRN

## 2014-07-19 MED ORDER — CIPROFLOXACIN HCL 250 MG PO TABS: 250.0000 mg | ORAL_TABLET | Freq: Two times a day (BID) | ORAL | Status: DC

## 2014-07-19 MED ORDER — DIPHENHYDRAMINE HCL 12.5 MG/5ML PO ELIX: 12.5000 mg | ORAL_SOLUTION | Freq: Four times a day (QID) | ORAL | Status: DC | PRN

## 2014-07-19 MED ORDER — MEPERIDINE HCL 50 MG/ML IJ SOLN: 6.2500 mg | INTRAMUSCULAR | Status: DC | PRN

## 2014-07-19 MED ORDER — HYDROMORPHONE HCL 1 MG/ML IJ SOLN
0.2500 mg | INTRAMUSCULAR | Status: DC | PRN
  Administered 2014-07-19 (×2): 0.5 mg via INTRAVENOUS

## 2014-07-19 MED ORDER — PROPOFOL 10 MG/ML IV BOLUS
INTRAVENOUS | Status: DC | PRN
  Administered 2014-07-19: 150 mg via INTRAVENOUS

## 2014-07-19 SURGICAL SUPPLY — 25 items
BAG URINE DRAINAGE (UROLOGICAL SUPPLIES) ×3 IMPLANT
BAG URO CATCHER STRL LF (DRAPE) ×3 IMPLANT
BLADE SURG 15 STRL LF DISP TIS (BLADE) IMPLANT
BLADE SURG 15 STRL SS (BLADE)
CATH FOLEY 3WAY 30CC 22FR (CATHETERS) ×3 IMPLANT
CATH URET 5FR 28IN OPEN ENDED (CATHETERS) IMPLANT
DRAPE CAMERA CLOSED 9X96 (DRAPES) ×3 IMPLANT
ELECT BUTTON HF 24-28F 2 30DE (ELECTRODE) IMPLANT
ELECT HF RESECT BIPO 24F 45 ND (CUTTING LOOP) IMPLANT
ELECT LOOP MED HF 24F 12D (CUTTING LOOP) IMPLANT
ELECT LOOP MED HF 24F 12D CBL (CLIP) ×3 IMPLANT
ELECT REM PT RETURN 9FT ADLT (ELECTROSURGICAL)
ELECTRODE REM PT RTRN 9FT ADLT (ELECTROSURGICAL) IMPLANT
GLOVE SURG SS PI 8.0 STRL IVOR (GLOVE) ×9 IMPLANT
GOWN STRL REUS W/TWL XL LVL3 (GOWN DISPOSABLE) ×6 IMPLANT
HOLDER FOLEY CATH W/STRAP (MISCELLANEOUS) ×3 IMPLANT
IV NS IRRIG 3000ML ARTHROMATIC (IV SOLUTION) IMPLANT
KIT ASPIRATION TUBING (SET/KITS/TRAYS/PACK) ×3 IMPLANT
MANIFOLD NEPTUNE II (INSTRUMENTS) ×3 IMPLANT
PACK CYSTO (CUSTOM PROCEDURE TRAY) ×3 IMPLANT
SUT ETHILON 3 0 PS 1 (SUTURE) IMPLANT
SYR 30ML LL (SYRINGE) ×3 IMPLANT
SYRINGE IRR TOOMEY STRL 70CC (SYRINGE) ×3 IMPLANT
TUBING CONNECTING 10 (TUBING) ×2 IMPLANT
TUBING CONNECTING 10' (TUBING) ×1

## 2014-07-19 NOTE — Op Note (Signed)
NAME:  Paul Gray, Paul Gray NO.:  [id number]  MEDICAL RECORD NO.:  [id number]  LOCATION:  37                         FACILITY:  [facility]  PHYSICIAN:  Marshall Cork. Jeffie Pollock, M.D.    DATE OF BIRTH:  07/09/1923  DATE OF PROCEDURE:  07/19/2014 DATE OF DISCHARGE:                              OPERATIVE REPORT   PROCEDURE: 1. Cystoscopy with removal of bladder stones. 2. Transurethral resection of the prostate.  PREOPERATIVE DIAGNOSIS:  Benign prostatic hypertrophy with retention.  POSTOPERATIVE DIAGNOSIS:  Benign prostatic hypertrophy with retention with small bladder stones.  SURGEON:  Marshall Cork. Jeffie Pollock, M.D.  ANESTHESIA:  General.  SPECIMENS:  Stones and prostate chips.  DRAINS:  A 22-French 3-way Foley catheter.  COMPLICATIONS:  None.  INDICATIONS:  Mr. Lorusso is an 80 year old white male with urinary retention.  On urodynamics, he did have retained bladder function, but appeared to have a large prostate with outlet obstruction.  After reviewing the options, TURP was chosen.  FINDINGS AND PROCEDURE:  He was given Cipro.  He was taken to the operating room where general anesthetic was induced.  He was placed in lithotomy position and fitted with PAS hose.  His perineum and genitalia were prepped with Betadine solution and he was draped in usual sterile fashion.  The 28-French resectoscope sheath was inserted without difficulty with a visual obturator.  Examination revealed a normal urethra.  The external sphincter was intact.  The prostate was approximately 4 cm in length with bilobar hyperplasia and obstruction.  There was a small middle lobe.  Examination of the bladder revealed moderate-to-severe trabeculation with some cellules and small diverticula.  Ureteral orifices were unremarkable.  There were several small bladder stones at the base of the bladder.  The bladder stones were evacuated out, they proved to be 5-6 mm in size, and a total of 7 stones were removed  through aspiration.  Once the stones were removed, a bipolar loop and a 12-degree lens were placed.  The prostate was then resected beginning with the middle lobe down to the bladder neck fibers.  The floor of the prostate was resected out to alongside the verumontanum.  The right lobe of the prostate was resected from bladder neck to apex out to capsular fibers.  This was followed by the left lobe.  Hemostasis was achieved throughout.  At this point, the chips were evacuated, the prostate was reinspected, some additional apical and anterior tissue was then resected to complete the TUR.  Once it was well resected, the bladder was evacuated free of chips and final hemostasis was achieved.  Final inspection revealed no retained chips, intact ureteral orifices, and intact external sphincter.  The bladder was left with no active bleeding.  The bladder was left full and the scope was removed.  Pressure on the bladder produced an excellent stream.  A 22-French 3-way Foley catheter was inserted with the aid of a catheter guide.  The balloon was filled with 30 mL of sterile fluid. The catheter was hand irrigated with clear return and placed to continuous irrigation and straight drainage.  The patient was taken down from lithotomy position, his anesthetic was reversed, he was  moved to the recovery room in stable condition.  There were no complications.     Marshall Cork. Jeffie Pollock, M.D.     JJW/MEDQ  D:  07/19/2014  T:  07/19/2014  Job:  [id number]

## 2014-07-19 NOTE — Transfer of Care (Signed)
Immediate Anesthesia Transfer of Care Note  Patient: Paul Gray  Procedure(s) Performed: Procedure(s): TRANSURETHRAL RESECTION OF THE PROSTATE WITH GYRUS INSTRUMENTS (N/A)  Patient Location: PACU  Anesthesia Type:General  Level of Consciousness: awake, sedated and patient cooperative  Airway & Oxygen Therapy: Patient Spontanous Breathing and Patient connected to face mask oxygen  Post-op Assessment: Report given to PACU RN and Post -op Vital signs reviewed and stable  Post vital signs: Reviewed and stable  Complications: No apparent anesthesia complications

## 2014-07-19 NOTE — Progress Notes (Signed)
Patient ID: Paul Gray, male   DOB: Jan 21, 1923, 80 y.o.   MRN: [id number]  He is doing well post op.  His urine is clear on minimal irrigation.  I will have the foley removed at 5 am.

## 2014-07-19 NOTE — Brief Op Note (Signed)
07/19/2014  10:17 AM  PATIENT:  Paul Gray  80 y.o. male  PRE-OPERATIVE DIAGNOSIS:  BPH WITH RETENTION   POST-OPERATIVE DIAGNOSIS:  BPH with retention and bladder stones.  PROCEDURE:  Procedure(s): TRANSURETHRAL RESECTION OF THE PROSTATE WITH GYRUS INSTRUMENTS (N/A) Removal of bladder stones.   SURGEON:  Surgeon(s) and Role:    * Malka So, MD - Primary  PHYSICIAN ASSISTANT:   ASSISTANTS: none   ANESTHESIA:   general  EBL:  Total I/O In: -  Out: 275 [Urine:275]  BLOOD ADMINISTERED:none  DRAINS: Urinary Catheter (Foley)   LOCAL MEDICATIONS USED:  NONE  SPECIMEN:  Source of Specimen:  prostate chips and bladder stones.  DISPOSITION OF SPECIMEN:  prostate chips to path.  Stones to family.  COUNTS:  YES  TOURNIQUET:  * No tourniquets in log *  DICTATION: .Other Dictation: Dictation Number [id number]  PLAN OF CARE: Admit for overnight observation  PATIENT DISPOSITION:  PACU - hemodynamically stable.   Delay start of Pharmacological VTE agent (>24hrs) due to surgical blood loss or risk of bleeding: yes

## 2014-07-19 NOTE — Anesthesia Preprocedure Evaluation (Signed)
Anesthesia Evaluation  Patient identified by MRN, date of birth, ID band Patient awake    Reviewed: Allergy & Precautions, H&P , NPO status , Patient's Chart, lab work & pertinent test results  Airway Mallampati: II  TM Distance: >3 FB Neck ROM: Full    Dental no notable dental hx.    Pulmonary shortness of breath, COPDformer smoker,  breath sounds clear to auscultation  Pulmonary exam normal       Cardiovascular negative cardio ROS  Rhythm:Regular Rate:Normal     Neuro/Psych negative neurological ROS  negative psych ROS   GI/Hepatic negative GI ROS, Neg liver ROS,   Endo/Other  negative endocrine ROS  Renal/GU CRFRenal disease     Musculoskeletal negative musculoskeletal ROS (+)   Abdominal   Peds  Hematology negative hematology ROS (+)   Anesthesia Other Findings   Reproductive/Obstetrics                             Anesthesia Physical Anesthesia Plan  ASA: III  Anesthesia Plan: General   Post-op Pain Management:    Induction: Intravenous  Airway Management Planned: LMA  Additional Equipment:   Intra-op Plan:   Post-operative Plan: Extubation in OR  Informed Consent: I have reviewed the patients History and Physical, chart, labs and discussed the procedure including the risks, benefits and alternatives for the proposed anesthesia with the patient or authorized representative who has indicated his/her understanding and acceptance.   Dental advisory given  Plan Discussed with: CRNA  Anesthesia Plan Comments:         Anesthesia Quick Evaluation

## 2014-07-19 NOTE — Anesthesia Postprocedure Evaluation (Signed)
Anesthesia Post Note  Patient: Paul Gray  Procedure(s) Performed: Procedure(s) (LRB): TRANSURETHRAL RESECTION OF THE PROSTATE WITH GYRUS INSTRUMENTS (N/A)  Anesthesia type: General  Patient location: PACU  Post pain: Pain level controlled  Post assessment: Post-op Vital signs reviewed  Last Vitals: BP 144/60  Pulse 71  Temp(Src) 36.7 C (Oral)  Resp 16  Ht 5\' 9"  (1.753 m)  Wt 155 lb 4 oz (70.421 kg)  BMI 22.92 kg/m2  SpO2 99%  Post vital signs: Reviewed  Level of consciousness: sedated  Complications: No apparent anesthesia complications

## 2014-07-19 NOTE — Progress Notes (Signed)
MRSA PCR positive. Contact precautions initiated.  Explained to patient and son why patient was on contact.  Questions answered.  Initiating positive MRSA order set.

## 2014-07-19 NOTE — Discharge Instructions (Signed)
Transurethral Resection of the Prostate Care After Refer to this sheet in the next few weeks. These instructions provide you with information on caring for yourself after your procedure. Your caregiver also may give you specific instructions. Your treatment has been planned according to current medical practices, but complications sometimes occur. Call your caregiver if you have any problems or questions after your procedure. HOME CARE INSTRUCTIONS  Recovery can take 4-6 weeks. Avoid alcohol, caffeinated drinks, and spicy foods for 2 weeks after your procedure. Drink enough fluids to keep your urine clear or pale yellow. Urinate as soon as you feel the urge to do so. Do not try to hold your urine for long periods of time. During recovery you may experience pain caused by bladder spasms, which result in a very intense urge to urinate. Take all medicines as directed by your caregiver, including medicines for pain. Try to limit the amount of pain medicines you take because it can cause constipation. If you do become constipated, do not strain to move your bowels. Straining can increase bleeding. Constipation can be minimized by increasing the amount fluids and fiber in your diet. Your caregiver also may prescribe a stool softener. Do not lift heavy objects (more than 5 lb [2.25 kg]) or perform exercises that cause you to strain for at least 1 month after your procedure. When sitting, you may want to sit in a soft chair or use a cushion. For the first 10 days after your procedure, avoid the following activities:  Running.  Strenuous work.  Long walks.  Riding in a car for extended periods.  Sex. SEEK MEDICAL CARE IF:  You have difficulty urinating.  You have blood in your urine that does not go away after you rest or increase your fluid intake.  You have swelling in your penis or scrotum. SEEK IMMEDIATE MEDICAL CARE IF:   You are suddenly unable to urinate.  You notice blood clots in your  urine.  You have chills.  You have a fever.  You have pain in your back or lower abdomen.  You have pain or swelling in your legs. MAKE SURE YOU:   Understand these instructions.  Will watch your condition.  Will get help right away if you are not doing well or get worse. Document Released: 09/07/2005 Document Revised: 06/01/2012 Document Reviewed: 10/16/2011 [facility] Patient Information 2015 [place], [place]. This information is not intended to replace advice given to you by your health care provider. Make sure you discuss any questions you have with your health care provider.

## 2014-07-19 NOTE — Interval H&P Note (Signed)
History and Physical Interval Note:  07/19/2014 9:00 AM  Paul Gray  has presented today for surgery, with the diagnosis of BPH WITH RETENTION   The various methods of treatment have been discussed with the patient and family. After consideration of risks, benefits and other options for treatment, the patient has consented to  Procedure(s): TRANSURETHRAL RESECTION OF THE PROSTATE WITH GYRUS INSTRUMENTS (N/A) as a surgical intervention .  The patient's history has been reviewed, patient examined, no change in status, stable for surgery.  I have reviewed the patient's chart and labs.  Questions were answered to the patient's satisfaction.     Majesta Leichter J

## 2014-07-20 DIAGNOSIS — N401 Enlarged prostate with lower urinary tract symptoms: Secondary | ICD-10-CM | POA: Diagnosis not present

## 2014-07-20 NOTE — Progress Notes (Signed)
No complications with CBI, fluid returned light pink, no blood clots. Foley catheter removed as ordered, patient tolerated the removal well.

## 2014-07-20 NOTE — Progress Notes (Signed)
D/c instructions given to son to take to [place]. D/c summary faxed to [facility] by SW. Pt son transported to patient. [name] A

## 2014-07-20 NOTE — Discharge Summary (Signed)
Physician Discharge Summary  Patient ID: Paul Gray MRN: [id number] DOB/AGE: November 15, 1922 80 y.o.  Admit date: 07/19/2014 Discharge date: 07/20/2014  Admission Diagnoses:  BPH (benign prostatic hypertrophy) with urinary retention  Discharge Diagnoses:  Principal Problem:   BPH (benign prostatic hypertrophy) with urinary retention Active Problems:   Bladder stones   Past Medical History  Diagnosis Date  . Back pain   . BPH (benign prostatic hyperplasia)   . S/P colonoscopy 2002, 2007    Hyperplastic polyps 2002; pancolonic diverticula 2007  . Hx of adenomatous colonic polyps   . COPD (chronic obstructive pulmonary disease)   . CKD (chronic kidney disease) stage 4, GFR 15-29 ml/min     Creatinine 2.0  . Shortness of breath   . Lung cancer     Surgeries: Procedure(s): TRANSURETHRAL RESECTION OF THE PROSTATE WITH GYRUS INSTRUMENTS on 07/19/2014  CYSTOSCOPY WITH REMOVAL OF BLADDER STONES Consultants (if any):    Discharged Condition: Improved  Hospital Course: Paul Gray is an 80 y.o. male who was admitted 07/19/2014 with a diagnosis of BPH (benign prostatic hypertrophy) with urinary retention and went to the operating room on 07/19/2014 and underwent the above named procedures.  His foley was removed this morning and he is voiding without difficulty.  The urine is light red without clots and he is without complaints.   He was given perioperative antibiotics:  Anti-infectives   Start     Dose/Rate Route Frequency Ordered Stop   07/19/14 2000  ciprofloxacin (CIPRO) tablet 250 mg     250 mg Oral 2 times daily 07/19/14 1153     07/19/14 1200  ciprofloxacin (CIPRO) tablet 250 mg  Status:  Discontinued     250 mg Oral 2 times daily 07/19/14 1146 07/19/14 1153   07/19/14 0632  ciprofloxacin (CIPRO) IVPB 400 mg     400 mg 200 mL/hr over 60 Minutes Intravenous 60 min pre-op 07/19/14 1751 07/19/14 0953    .  He was given sequential compression devices for DVT  prophylaxis.  He benefited maximally from the hospital stay and there were no complications.    Recent vital signs:  Filed Vitals:   07/20/14 0623  BP: 136/75  Pulse: 92  Temp:   Resp:     Recent laboratory studies:  Lab Results  Component Value Date   HGB 11.9* 07/19/2014   HGB 11.3* 06/15/2014   HGB 12.6* 06/08/2014   Lab Results  Component Value Date   WBC 7.8 07/19/2014   PLT 205 07/19/2014   Lab Results  Component Value Date   INR 1.08 06/08/2014   Lab Results  Component Value Date   NA 131* 07/19/2014   K 4.8 07/19/2014   CL 94* 07/19/2014   CO2 26 07/19/2014   BUN 31* 07/19/2014   CREATININE 1.70* 07/19/2014   GLUCOSE 113* 07/19/2014    Discharge Medications:     Medication List    STOP taking these medications       sulfamethoxazole-trimethoprim 800-160 MG per tablet  Commonly known as:  BACTRIM DS      TAKE these medications       ADVAIR DISKUS 250-50 MCG/DOSE Aepb  Generic drug:  Fluticasone-Salmeterol  Inhale 1 puff into the lungs every 12 (twelve) hours. 9 am and 9 pm     docusate sodium 100 MG capsule  Commonly known as:  COLACE  Take 1 capsule (100 mg total) by mouth every 12 (twelve) hours.     HYDROcodone-acetaminophen 5-325 MG per tablet  Commonly known as:  NORCO/VICODIN  Take 1 tablet by mouth 4 (four) times daily. 6 am 12pm 6 pm 12 am     HYDROcodone-acetaminophen 5-325 MG per tablet  Commonly known as:  NORCO  Take 1 tablet by mouth every 4 (four) hours as needed for moderate pain.     tiotropium 18 MCG inhalation capsule  Commonly known as:  SPIRIVA  Place 18 mcg into inhaler and inhale every morning.      ASK your doctor about these medications       finasteride 5 MG tablet  Commonly known as:  PROSCAR  Take 5 mg by mouth at bedtime.        Diagnostic Studies: No results found.  Disposition: 01-Home or Self Care      Discharge Instructions   Discontinue IV    Complete by:  As directed             Follow-up Information   Follow up with Paul So, MD On 07/27/2014. (145)    Specialty:  Urology   Contact information:   [address] [place]  [id number] [phone]        Signed: Malka Gray 07/20/2014, 6:53 AM

## 2014-07-22 ENCOUNTER — Emergency Department (HOSPITAL_COMMUNITY)
Admission: EM | Admit: 2014-07-22 | Discharge: 2014-07-22 | Disposition: A | Discharge status: Home / Self Care | Payer: Medicare Other | Attending: Emergency Medicine | Admitting: Emergency Medicine

## 2014-07-22 DIAGNOSIS — Z880 Allergy status to penicillin: Secondary | ICD-10-CM | POA: Diagnosis not present

## 2014-07-22 DIAGNOSIS — N184 Chronic kidney disease, stage 4 (severe): Secondary | ICD-10-CM | POA: Insufficient documentation

## 2014-07-22 DIAGNOSIS — Z87891 Personal history of nicotine dependence: Secondary | ICD-10-CM | POA: Diagnosis not present

## 2014-07-22 DIAGNOSIS — Z86010 Personal history of colonic polyps: Secondary | ICD-10-CM | POA: Diagnosis not present

## 2014-07-22 DIAGNOSIS — Z85118 Personal history of other malignant neoplasm of bronchus and lung: Secondary | ICD-10-CM | POA: Diagnosis not present

## 2014-07-22 DIAGNOSIS — R339 Retention of urine, unspecified: Secondary | ICD-10-CM | POA: Diagnosis present

## 2014-07-22 DIAGNOSIS — Z7951 Long term (current) use of inhaled steroids: Secondary | ICD-10-CM | POA: Diagnosis not present

## 2014-07-22 DIAGNOSIS — N390 Urinary tract infection, site not specified: Secondary | ICD-10-CM | POA: Insufficient documentation

## 2014-07-22 DIAGNOSIS — J449 Chronic obstructive pulmonary disease, unspecified: Secondary | ICD-10-CM | POA: Diagnosis not present

## 2014-07-22 DIAGNOSIS — Z79899 Other long term (current) drug therapy: Secondary | ICD-10-CM | POA: Insufficient documentation

## 2014-07-22 LAB — URINALYSIS, ROUTINE W REFLEX MICROSCOPIC
Bilirubin Urine: NEGATIVE
GLUCOSE, UA: NEGATIVE mg/dL
KETONES UR: NEGATIVE mg/dL
Nitrite: POSITIVE — AB
PH: 6 (ref 5.0–8.0)
Protein, ur: 100 mg/dL — AB
Specific Gravity, Urine: 1.015 (ref 1.005–1.030)
Urobilinogen, UA: 0.2 mg/dL (ref 0.0–1.0)

## 2014-07-22 LAB — URINE MICROSCOPIC-ADD ON

## 2014-07-22 MED ORDER — CIPROFLOXACIN HCL 500 MG PO TABS: 500.0000 mg | ORAL_TABLET | Freq: Two times a day (BID) | ORAL | Status: DC

## 2014-07-22 MED ORDER — CIPROFLOXACIN HCL 250 MG PO TABS
500.0000 mg | ORAL_TABLET | Freq: Once | ORAL | Status: AC
  Administered 2014-07-22: 500 mg via ORAL
  Filled 2014-07-22: qty 2

## 2014-07-22 NOTE — Discharge Instructions (Signed)
Follow up with your urologist this week

## 2014-07-22 NOTE — ED Provider Notes (Signed)
CSN: [id number]     Arrival date & time 07/22/14  1948 History  This chart was scribed for Maudry Diego, MD by Dellis Filbert, ED Scribe. The patient was seen in [facility] and the patient's care was started at 8:16 PM.    Chief Complaint  Patient presents with  . Urinary Retention   Patient is a 80 y.o. male presenting with frequency. The history is provided by the patient. No language interpreter was used.  Urinary Frequency This is a new problem. The current episode started 6 to 12 hours ago. The problem occurs rarely. The problem has not changed since onset.Nothing aggravates the symptoms. Nothing relieves the symptoms. He has tried nothing for the symptoms.    HPI Comments: Paul Gray is a 80 y.o. male who presents to the Emergency Department complaining of problems with urinating. He used the bathroom at 12:00 PM this afternoon but has been unable too since then. He had his urinary catheter removed on Thursday.    Past Medical History  Diagnosis Date  . Back pain   . BPH (benign prostatic hyperplasia)   . S/P colonoscopy 2002, 2007    Hyperplastic polyps 2002; pancolonic diverticula 2007  . Hx of adenomatous colonic polyps   . COPD (chronic obstructive pulmonary disease)   . CKD (chronic kidney disease) stage 4, GFR 15-29 ml/min     Creatinine 2.0  . Shortness of breath   . Lung cancer    Past Surgical History  Procedure Laterality Date  . Hernia surgery x 3      Inguinal hernia repair  . Cholecystectomy    . Appendectomy    . Radiofrequency ablation for lung cancer    . Eye surgery      cataracts removed with lens implant  . Hernia repair  06/08/2014    WITH MESH  . Inguinal hernia repair Right 06/08/2014    Procedure: RIGHT INGUINAL HERNIA REPAIR WITH MESH;  Surgeon: Jackolyn Confer, MD;  Location: [facility];  Service: General;  Laterality: Right;  . Insertion of mesh Right 06/08/2014    Procedure: INSERTION OF MESH;  Surgeon: Jackolyn Confer, MD;  Location: [facility];   Service: General;  Laterality: Right;   Family History  Problem Relation Age of Onset  . CAD Brother     Not premature  . Heart failure Father     Died in his 107s   History  Substance Use Topics  . Smoking status: Former Smoker -- 1.00 packs/day for 60 years    Types: Cigarettes    Quit date: 09/22/2003  . Smokeless tobacco: Never Used     Comment: quit 6 years ago, about 1 ppd X 60+ years  . Alcohol Use: No    Review of Systems  Constitutional: Negative for appetite change and fatigue.  HENT: Negative for congestion, ear discharge and sinus pressure.   Eyes: Negative for discharge.  Respiratory: Negative for cough.   Gastrointestinal: Negative for diarrhea.  Genitourinary: Positive for frequency. Negative for hematuria.  Musculoskeletal: Negative for back pain.  Skin: Negative for rash.  Neurological: Negative for seizures.  Psychiatric/Behavioral: Negative for hallucinations.    Allergies  Penicillins  Home Medications   Prior to Admission medications   Medication Sig Start Date End Date Taking? Authorizing Provider  docusate sodium (COLACE) 100 MG capsule Take 1 capsule (100 mg total) by mouth every 12 (twelve) hours. 06/16/14   Mariea Clonts, MD  Fluticasone-Salmeterol (ADVAIR DISKUS) 250-50 MCG/DOSE AEPB Inhale 1  puff into the lungs every 12 (twelve) hours. 9 am and 9 pm    Historical Provider, MD  HYDROcodone-acetaminophen (NORCO) 5-325 MG per tablet Take 1 tablet by mouth every 4 (four) hours as needed for moderate pain. 06/08/14   Jackolyn Confer, MD  HYDROcodone-acetaminophen (NORCO/VICODIN) 5-325 MG per tablet Take 1 tablet by mouth 4 (four) times daily. 6 am 12pm 6 pm 12 am    Historical Provider, MD  tiotropium (SPIRIVA) 18 MCG inhalation capsule Place 18 mcg into inhaler and inhale every morning.     Historical Provider, MD   BP 123/73 mmHg  Pulse 102  Temp(Src) 98.1 F (36.7 C) (Oral)  Resp 18  Ht 5\' 9"  (1.753 m)  Wt 165 lb (74.844 kg)  BMI 24.36  kg/m2  SpO2 96% Physical Exam  Constitutional: He is oriented to person, place, and time. He appears well-developed.  HENT:  Head: Normocephalic.  Eyes: Conjunctivae are normal.  Neck: No tracheal deviation present.  Cardiovascular:  No murmur heard. Genitourinary:  Has a foley   Musculoskeletal: Normal range of motion.  Neurological: He is oriented to person, place, and time.  Skin: Skin is warm.  Psychiatric: He has a normal mood and affect.    ED Course  Procedures  DIAGNOSTIC STUDIES: Oxygen Saturation is 96% on room air, adequate by my interpretation.    COORDINATION OF CARE: 8:20 PM Discussed treatment plan with pt at bedside and pt agreed to plan.   Labs Review Labs Reviewed  URINALYSIS, ROUTINE W REFLEX MICROSCOPIC    Imaging Review No results found.   EKG Interpretation None      MDM   Final diagnoses:  None    .The chart was scribed for me under my direct supervision.  I personally performed the history, physical, and medical decision making and all procedures in the evaluation of this patient.Maudry Diego, MD 07/22/14 2101

## 2014-07-22 NOTE — ED Notes (Signed)
Pt has had urinary problems d/t prostate problems, had a urinary catheter in place that was taken out a couple of days ago, today has not been able to urinate since this am.

## 2014-07-23 ENCOUNTER — Encounter (HOSPITAL_COMMUNITY): Payer: Self-pay | Admitting: Urology

## 2014-07-25 ENCOUNTER — Telehealth (HOSPITAL_BASED_OUTPATIENT_CLINIC_OR_DEPARTMENT_OTHER): Payer: Self-pay | Admitting: Emergency Medicine

## 2014-07-25 LAB — URINE CULTURE: Colony Count: >=100000

## 2014-07-25 NOTE — Telephone Encounter (Signed)
Post ED Visit - Positive Culture Follow-up: Successful Patient Follow-Up  Culture assessed and recommendations reviewed by: []  Wes Colton, Pharm.D., BCPS [x]  Heide Guile, Pharm.D., BCPS []  Alycia Rossetti, Pharm.D., BCPS []  [place], [place].D., BCPS, AAHIVP []  Legrand Como, Pharm.D., BCPS, AAHIVP []  Hassie Bruce, Pharm.D. []  Milus Glazier, Pharm.D.  Positive urine culture >100, 000 colonies Staphylococcus  []  Patient discharged without antimicrobial prescription and treatment is now indicated [x]  Organism is resistant to prescribed ED discharge antimicrobial []  Patient with positive blood cultures  Changes discussed with ED provider: Clemens Catholic NP Recommendation to change antibiotics to Bactrim DS one tablet q 24hours Faxed to [facility]     Hazle Nordmann 07/25/2014, 12:17 PM

## 2014-07-25 NOTE — Progress Notes (Signed)
ED Antimicrobial Stewardship Positive Culture Follow Up   Paul Gray is an 80 y.o. male who presented to [facility] on 07/22/2014 with a chief complaint of  Chief Complaint  Patient presents with  . Urinary Retention    Recent Results (from the past 720 hour(s))  MRSA PCR Screening     Status: Abnormal   Collection Time: 07/19/14 11:59 AM  Result Value Ref Range Status   MRSA by PCR POSITIVE (A) NEGATIVE Final    Comment:        The GeneXpert MRSA Assay (FDA approved for NASAL specimens only), is one component of a comprehensive MRSA colonization surveillance program. It is not intended to diagnose MRSA infection nor to guide or monitor treatment for MRSA infections. RESULT CALLED TO, READ BACK BY AND VERIFIED WITH: HOLDERNESS HILLARY RN 07/19/2014 1802 BY BOVELL,.T  Urine culture     Status: None   Collection Time: 07/22/14  8:00 PM  Result Value Ref Range Status   Specimen Description URINE, CATHETERIZED  Final   Special Requests NONE  Final   Culture  Setup Time   Final    07/23/2014 17:22 Performed at [facility]   Final    >=100,000 COLONIES/ML Performed at [facility]    Culture   Final    METHICILLIN RESISTANT STAPHYLOCOCCUS AUREUS Note: RIFAMPIN AND GENTAMICIN SHOULD NOT BE USED AS SINGLE DRUGS FOR TREATMENT OF STAPH INFECTIONS. Performed at [facility]    Report Status 07/25/2014 FINAL  Final   Organism ID, Bacteria METHICILLIN RESISTANT STAPHYLOCOCCUS AUREUS  Final      Susceptibility   Methicillin resistant staphylococcus aureus - MIC*    GENTAMICIN <=0.5 SENSITIVE Sensitive     LEVOFLOXACIN >=8 RESISTANT Resistant     NITROFURANTOIN <=16 SENSITIVE Sensitive     OXACILLIN >=4 RESISTANT Resistant     PENICILLIN >=0.5 RESISTANT Resistant     RIFAMPIN <=0.5 SENSITIVE Sensitive     TRIMETH/SULFA <=10 SENSITIVE Sensitive     VANCOMYCIN 1 SENSITIVE Sensitive     TETRACYCLINE <=1 SENSITIVE Sensitive     *  METHICILLIN RESISTANT STAPHYLOCOCCUS AUREUS    [x]  Treated with ciprofloxacin, organism resistant to prescribed antimicrobial   New antibiotic prescription: Bactrim DS 1 tablet daily x 7 days  ED Provider: Clemens Catholic, NP   Candie Mile 07/25/2014, 10:07 AM Infectious Diseases Pharmacist Phone# [phone]

## 2014-07-26 ENCOUNTER — Telehealth (HOSPITAL_COMMUNITY): Payer: Self-pay

## 2014-07-26 NOTE — ED Notes (Signed)
Post ED Visit - Positive Culture Follow-up  Culture report reviewed by antimicrobial stewardship pharmacist: []  Wes [place], Pharm.D., BCPS []  Heide Guile, Pharm.D., BCPS []  Alycia Rossetti, Pharm.D., BCPS []  [place], Pharm.D., BCPS, AAHIVP []  Legrand Como, Pharm.D., BCPS, AAHIVP [x]  Elicia Lamp, Pharm.D.   Positive urine culture Treated with bactrim, organism sensitive to the same and no further patient follow-up is required at this time.  Ileene Musa 07/26/2014, 11:33 AM

## 2014-07-27 ENCOUNTER — Ambulatory Visit (INDEPENDENT_AMBULATORY_CARE_PROVIDER_SITE_OTHER): Payer: Medicare Other | Admitting: Urology

## 2014-07-27 DIAGNOSIS — N401 Enlarged prostate with lower urinary tract symptoms: Secondary | ICD-10-CM

## 2014-07-27 DIAGNOSIS — R339 Retention of urine, unspecified: Secondary | ICD-10-CM | POA: Diagnosis not present

## 2014-07-31 ENCOUNTER — Ambulatory Visit (INDEPENDENT_AMBULATORY_CARE_PROVIDER_SITE_OTHER): Payer: Medicare Other | Admitting: Urology

## 2014-07-31 DIAGNOSIS — R339 Retention of urine, unspecified: Secondary | ICD-10-CM

## 2014-07-31 DIAGNOSIS — N401 Enlarged prostate with lower urinary tract symptoms: Secondary | ICD-10-CM

## 2014-09-28 ENCOUNTER — Ambulatory Visit (INDEPENDENT_AMBULATORY_CARE_PROVIDER_SITE_OTHER): Payer: Self-pay | Admitting: Urology

## 2014-09-28 DIAGNOSIS — R339 Retention of urine, unspecified: Secondary | ICD-10-CM

## 2014-09-28 DIAGNOSIS — N401 Enlarged prostate with lower urinary tract symptoms: Secondary | ICD-10-CM

## 2015-01-16 IMAGING — CT CT ABD-PELV W/O CM
2 of 3 series · 15 of 46 positions shown, 17 images · non-contrast
Comparison: CT ABDOMEN W/CM dated 12/24/2005;

CLINICAL DATA: Burning with urination. Inability to urinate. Back
pain. Multiple chronic medical problems. Urinary retention.

EXAM:
CT ABDOMEN AND PELVIS WITHOUT CONTRAST
TECHNIQUE: Multidetector CT imaging of the abdomen and pelvis was performed
following the standard protocol without intravenous contrast.

[Series 2: standard/full over (age)lbs 5.0 · axial · 0.77mm/px · z∈[-768,-368]mm · 12 of 92 slices shown, 14 images]
[im 6/92  soft-tissue]
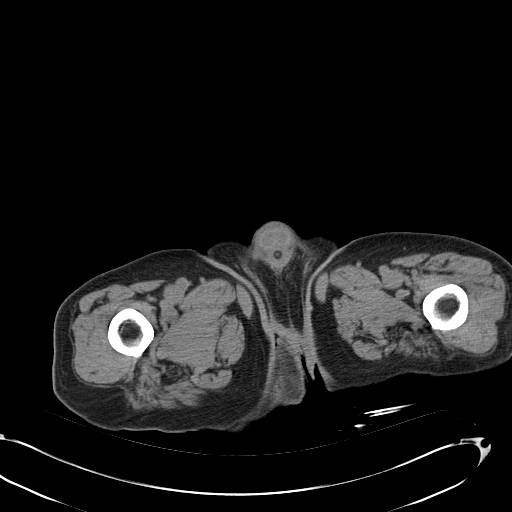
[im 6/92  bone]
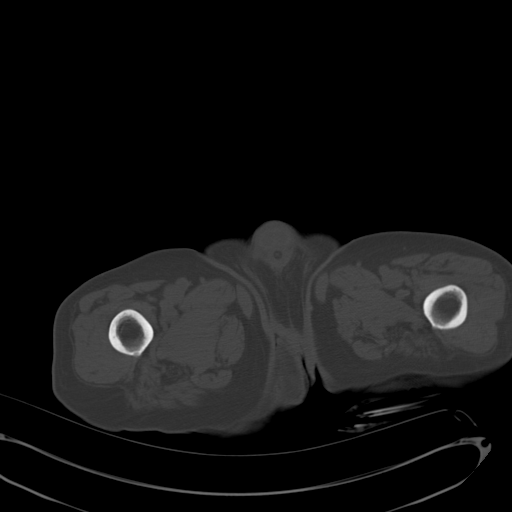
[im 12/92  soft-tissue]
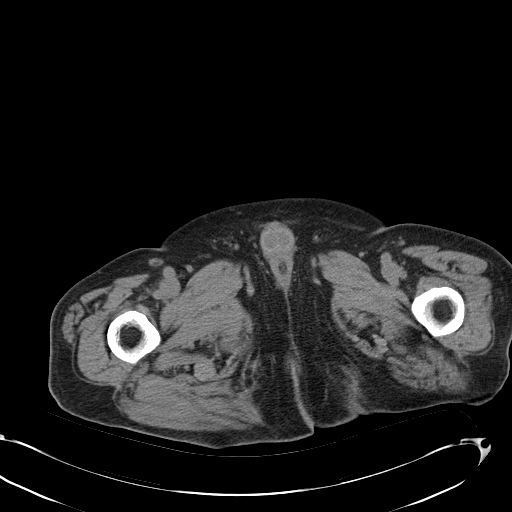
[im 21/92  soft-tissue]
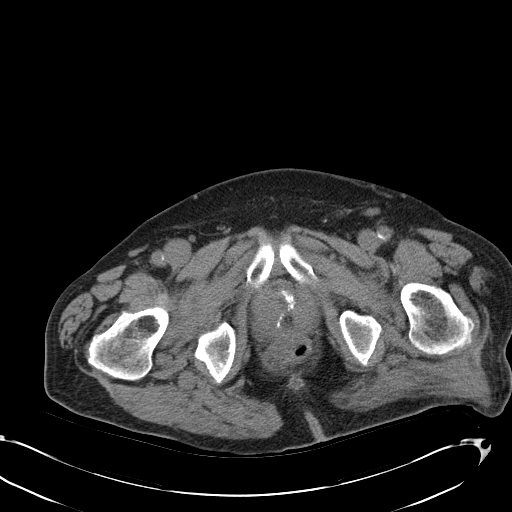
[im 27/92  soft-tissue]
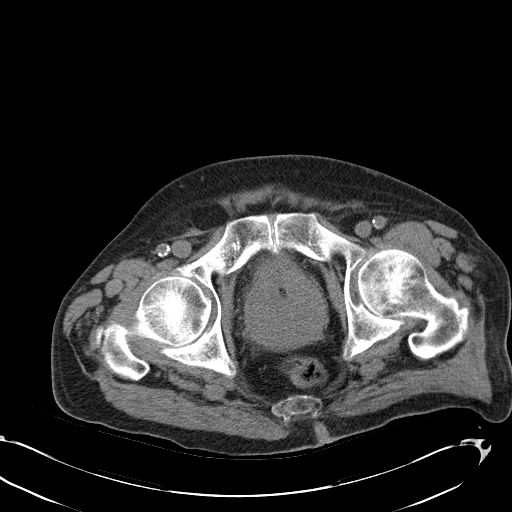
[im 36/92  soft-tissue]
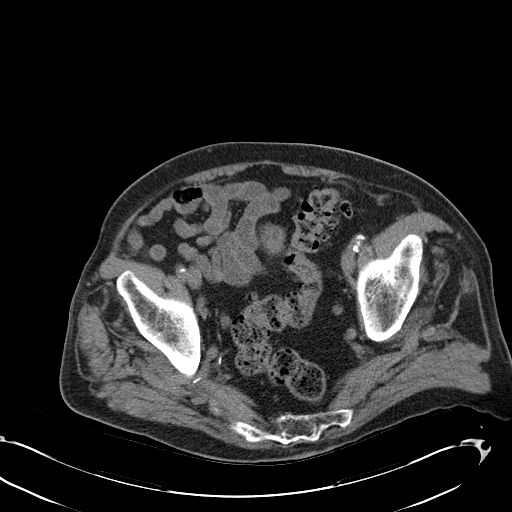
[im 42/92  soft-tissue]
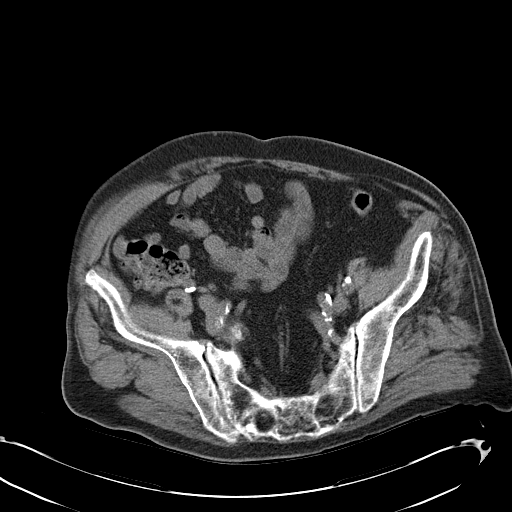
[im 50/92  soft-tissue]
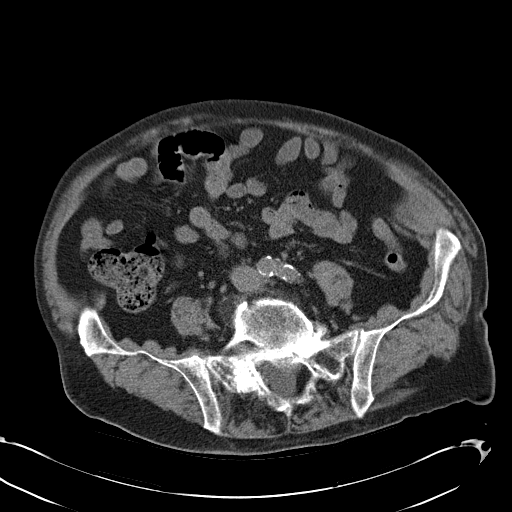
[im 56/92  soft-tissue]
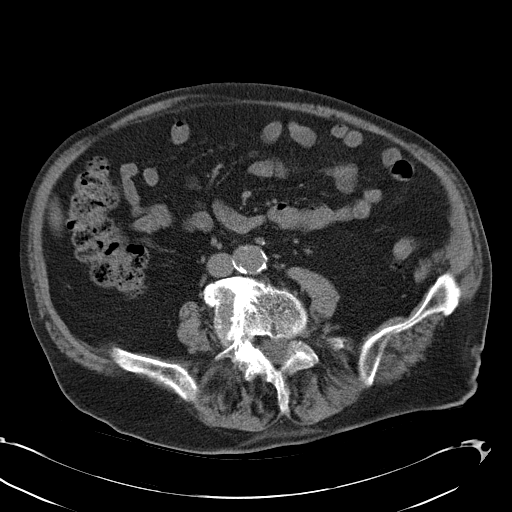
[im 65/92  soft-tissue]
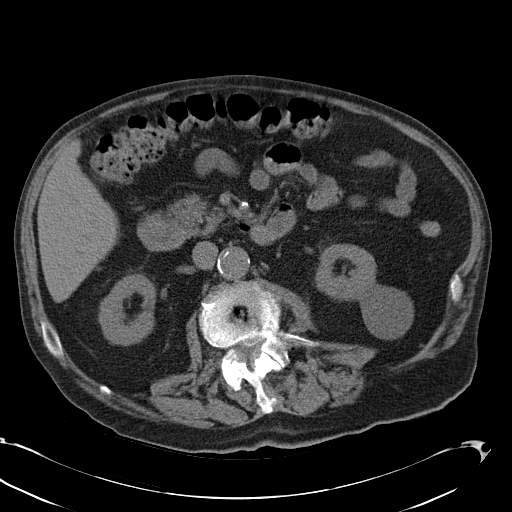
[im 65/92  bone]
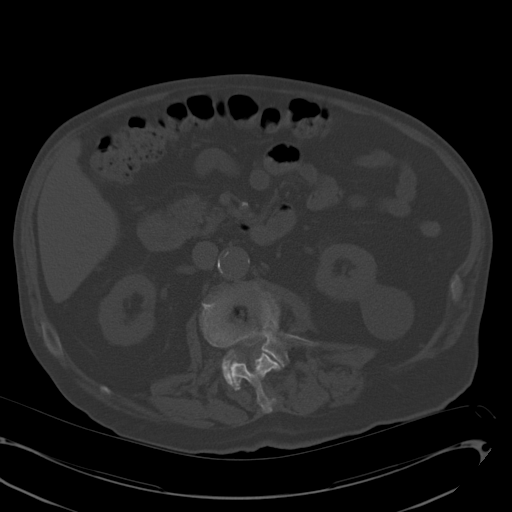
[im 71/92  soft-tissue]
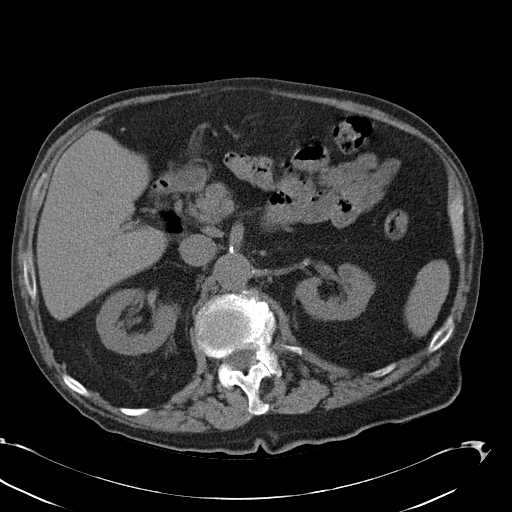
[im 80/92  soft-tissue]
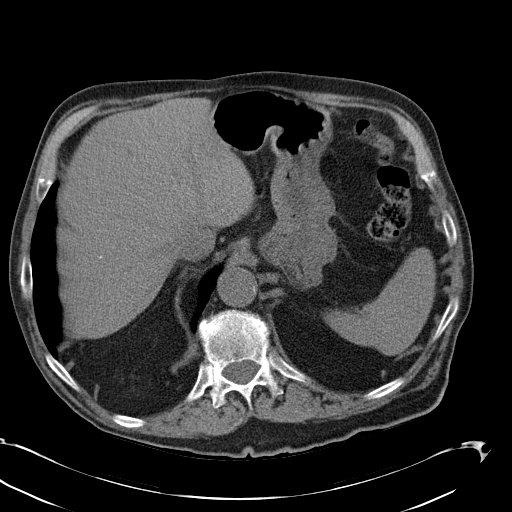
[im 86/92  soft-tissue]
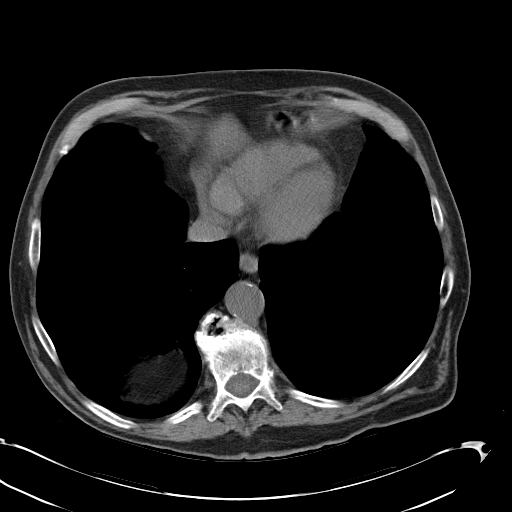

[Series 4: mpr coronal · coronal · 0.82mm/px · 3 of 109 slices shown]
[im 37/109  soft-tissue]
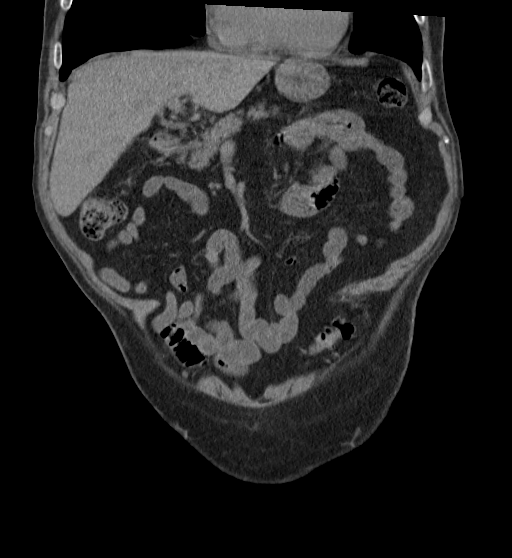
[im 49/109  soft-tissue]
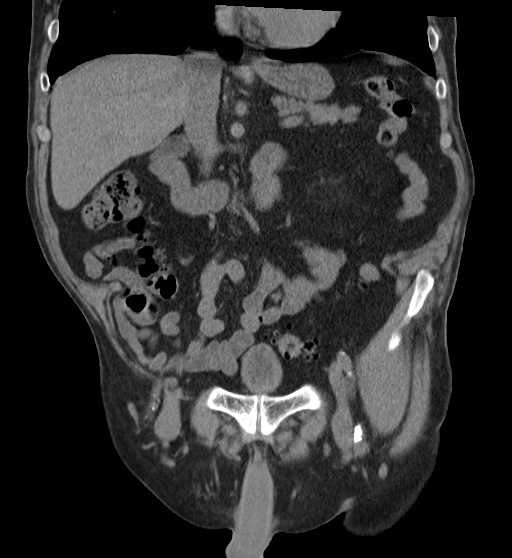
[im 61/109  soft-tissue]
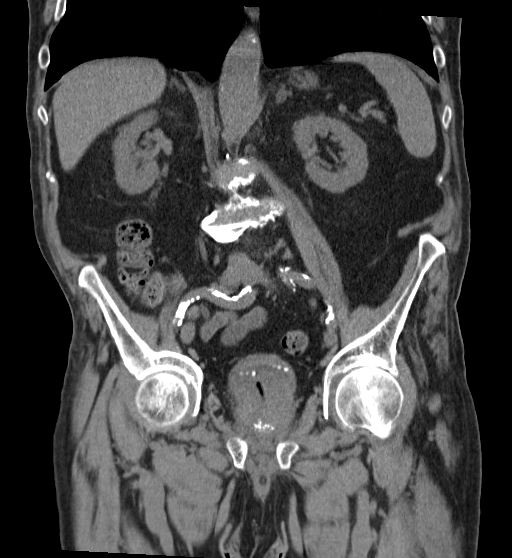

[15 of 46 positions shown; findings below may reference images not displayed]

NM PET NILU ESCANO SKULL
BASE T - THIGH dated 01/01/2006; DG LUMBAR SPINE COMPLETE dated
04/10/2011
FINDINGS: Lung Bases: In the right lower lobe laterally, there is a 7 mm
peripheral pulmonary nodule which is unchanged compared to 6227,
compatible with a benign nodule. Fat containing diaphragmatic hernia
is present on the right. This is compatible with a right-sided
Bochdalek's hernia, best seen on sagittal images.

Liver: Unenhanced CT was performed per clinician order. Lack of IV
contrast limits sensitivity and specificity, especially for
evaluation of abdominal/pelvic solid viscera. Old granulomatous
calcification.

Spleen:  Normal.

Gallbladder:  Absent, presumed cholecystectomy.

Common bile duct:  Normal.

Pancreas:  Normal.

Adrenal glands:  Normal bilaterally.

Kidneys: Unchanged exophytic left interpolar renal cyst. Punctate
right renal collecting system calculus. Mild ectasia of the right
ureter. Left ureter appears normal.

Stomach:  Collapsed.  Grossly normal.

Small bowel: Large periampullary duodenal diverticulum. No small
bowel inflammatory changes or mesenteric adenopathy.

Colon: No right lower quadrant inflammatory changes are present.
Appendix not identified. Colonic diverticulosis without
diverticulitis.

Pelvic Genitourinary: Prostatomegaly is present. Transverse
measurement of the prostate gland is 6.5 cm. There is a Foley
catheter. The balloon from the Foley catheter is inflated within the
prostate gland itself, best seen on sagittal images (image 70 series
5). The tip of the Foley catheter is at the base of the bladder.

There are multiple calcifications which appear to be within the
urinary bladder, compatible with bladder stones (each measuring
about 5 mm) associated with chronic stasis. The bladder is partially
decompressed with mural thickening. This may relate to chronic
bladder outflow obstruction from prostatomegaly. There is no
inguinal adenopathy.

Bones: Severe lumbar spondylosis with spondylolisthesis and
ankylosis at L4-L5. Rightward slip of L3 on L4 measuring 12 mm.
Chronically abnormal mineralization of the sacrum which appears
unchanged compared to 6227. This may relate to fibrous dysplasia or
Paget's disease. Scattered benign-appearing sclerotic lesions are
present compatible with bone islands and unchanged compared to
prior.

Vasculature: Atherosclerosis. Tortuosity of the iliofemoral system.
No acute vascular abnormality allowing for noncontrast technique.

Body Wall: Normal.
IMPRESSION: 1. Prostatomegaly. Foley catheter is present with the tip at the
base of the bladder and the balloon inflated within the prostate
gland proper (see discussion above). Adjustment of catheter position
is recommended. The catheter should be advanced at least 5.6 cm to
position the balloon within the bladder base.
2. Bladder calculi likely secondary chronic bladder outflow
obstruction. Mural thickening of the urinary bladder likely due to
same. Bladder is only mildly distended.
3. Nonobstructing punctate right renal collecting system calculi.
4. Abnormal mineralization of the sacrum appears chronic and benign.
5. Benign right lower lobe subpleural pulmonary nodule measuring 7
mm.
6. Right ureteral ectasia which could be associated with ascending
urinary tract infection or chronic bladder outflow obstruction.
Correlation with urinalysis recommended.

## 2015-04-04 ENCOUNTER — Inpatient Hospital Stay (HOSPITAL_COMMUNITY)
Admission: EM | Admit: 2015-04-04 | Discharge: 2015-04-07 | DRG: 372 | Disposition: A | Discharge status: Nursing Home (Includes ICF) | Payer: Medicare Other | Attending: Pulmonary Disease | Admitting: Pulmonary Disease

## 2015-04-04 ENCOUNTER — Emergency Department (HOSPITAL_COMMUNITY): Payer: Medicare Other

## 2015-04-04 ENCOUNTER — Encounter (HOSPITAL_COMMUNITY): Payer: Self-pay | Admitting: *Deleted

## 2015-04-04 DIAGNOSIS — R739 Hyperglycemia, unspecified: Secondary | ICD-10-CM | POA: Diagnosis present

## 2015-04-04 DIAGNOSIS — N184 Chronic kidney disease, stage 4 (severe): Secondary | ICD-10-CM | POA: Diagnosis present

## 2015-04-04 DIAGNOSIS — Z87891 Personal history of nicotine dependence: Secondary | ICD-10-CM | POA: Diagnosis not present

## 2015-04-04 DIAGNOSIS — E860 Dehydration: Secondary | ICD-10-CM | POA: Diagnosis present

## 2015-04-04 DIAGNOSIS — N179 Acute kidney failure, unspecified: Secondary | ICD-10-CM | POA: Diagnosis present

## 2015-04-04 DIAGNOSIS — Z85118 Personal history of other malignant neoplasm of bronchus and lung: Secondary | ICD-10-CM

## 2015-04-04 DIAGNOSIS — R509 Fever, unspecified: Secondary | ICD-10-CM

## 2015-04-04 DIAGNOSIS — A047 Enterocolitis due to Clostridium difficile: Principal | ICD-10-CM | POA: Diagnosis present

## 2015-04-04 DIAGNOSIS — J449 Chronic obstructive pulmonary disease, unspecified: Secondary | ICD-10-CM | POA: Diagnosis present

## 2015-04-04 DIAGNOSIS — R197 Diarrhea, unspecified: Secondary | ICD-10-CM | POA: Diagnosis present

## 2015-04-04 DIAGNOSIS — Z8249 Family history of ischemic heart disease and other diseases of the circulatory system: Secondary | ICD-10-CM

## 2015-04-04 DIAGNOSIS — N400 Enlarged prostate without lower urinary tract symptoms: Secondary | ICD-10-CM | POA: Diagnosis present

## 2015-04-04 DIAGNOSIS — Z66 Do not resuscitate: Secondary | ICD-10-CM | POA: Diagnosis present

## 2015-04-04 DIAGNOSIS — Z85528 Personal history of other malignant neoplasm of kidney: Secondary | ICD-10-CM

## 2015-04-04 DIAGNOSIS — A0472 Enterocolitis due to Clostridium difficile, not specified as recurrent: Secondary | ICD-10-CM | POA: Diagnosis present

## 2015-04-04 LAB — COMPREHENSIVE METABOLIC PANEL
ALK PHOS: 104 U/L (ref 38–126)
ALT: 17 U/L (ref 17–63)
AST: 23 U/L (ref 15–41)
Albumin: 4 g/dL (ref 3.5–5.0)
Anion gap: 11 (ref 5–15)
BILIRUBIN TOTAL: 0.7 mg/dL (ref 0.3–1.2)
BUN: 49 mg/dL — AB (ref 6–20)
CHLORIDE: 106 mmol/L (ref 101–111)
CO2: 25 mmol/L (ref 22–32)
Calcium: 8.6 mg/dL — ABNORMAL LOW (ref 8.9–10.3)
Creatinine, Ser: 3.01 mg/dL — ABNORMAL HIGH (ref 0.61–1.24)
GFR, EST AFRICAN AMERICAN: 20 mL/min — AB (ref >60)
GFR, EST NON AFRICAN AMERICAN: 17 mL/min — AB (ref >60)
GLUCOSE: 128 mg/dL — AB (ref 65–99)
Potassium: 4.5 mmol/L (ref 3.5–5.1)
Sodium: 142 mmol/L (ref 135–145)
Total Protein: 7.3 g/dL (ref 6.5–8.1)

## 2015-04-04 LAB — CBC WITH DIFFERENTIAL/PLATELET
Basophils Absolute: 0 10*3/uL (ref 0.0–0.1)
Basophils Relative: 0 % (ref 0–1)
EOS PCT: 0 % (ref 0–5)
Eosinophils Absolute: 0 10*3/uL (ref 0.0–0.7)
HCT: 46.2 % (ref 39.0–52.0)
HEMOGLOBIN: 14.8 g/dL (ref 13.0–17.0)
Lymphocytes Relative: 14 % (ref 12–46)
Lymphs Abs: 2.5 10*3/uL (ref 0.7–4.0)
MCH: 30.1 pg (ref 26.0–34.0)
MCHC: 32 g/dL (ref 30.0–36.0)
MCV: 93.9 fL (ref 78.0–100.0)
MONOS PCT: 5 % (ref 3–12)
Monocytes Absolute: 0.9 10*3/uL (ref 0.1–1.0)
NEUTROS PCT: 81 % — AB (ref 43–77)
Neutro Abs: 15.3 10*3/uL — ABNORMAL HIGH (ref 1.7–7.7)
PLATELETS: 175 10*3/uL (ref 150–400)
RBC: 4.92 MIL/uL (ref 4.22–5.81)
RDW: 14.9 % (ref 11.5–15.5)
WBC: 18.8 10*3/uL — ABNORMAL HIGH (ref 4.0–10.5)

## 2015-04-04 MED ORDER — SODIUM CHLORIDE 0.9 % IV BOLUS (SEPSIS)
1000.0000 mL | Freq: Once | INTRAVENOUS | Status: AC
  Administered 2015-04-04: 1000 mL via INTRAVENOUS

## 2015-04-04 NOTE — ED Provider Notes (Signed)
CSN: [id number]     Arrival date & time 04/04/15  1945 History   First MD Initiated Contact with Patient 04/04/15 2121     Chief Complaint  Patient presents with  . Nausea  . Diarrhea     (Consider location/radiation/quality/duration/timing/severity/associated sxs/prior Treatment) Patient is a 81 y.o. male presenting with diarrhea.  Diarrhea Quality:  Watery Severity:  Severe Onset quality:  Sudden Number of episodes:  Was every hour yesterday after picnic, today resolving (normal stool tonight) Duration:  1 day Timing:  Constant Progression:  Resolved Relieved by:  Nothing Worsened by:  Nothing tried Associated symptoms: fever (100.7)   Associated symptoms: no abdominal pain, no diaphoresis, no headaches and no vomiting   Risk factors: no recent antibiotic use and no sick contacts (not known)  Suspicious food intake: possible at picnic.     Past Medical History  Diagnosis Date  . Back pain   . BPH (benign prostatic hyperplasia)   . S/P colonoscopy 2002, 2007    Hyperplastic polyps 2002; pancolonic diverticula 2007  . Hx of adenomatous colonic polyps   . COPD (chronic obstructive pulmonary disease)   . CKD (chronic kidney disease) stage 4, GFR 15-29 ml/min     Creatinine 2.0  . Shortness of breath   . Lung cancer    Past Surgical History  Procedure Laterality Date  . Hernia surgery x 3      Inguinal hernia repair  . Cholecystectomy    . Appendectomy    . Radiofrequency ablation for lung cancer    . Eye surgery      cataracts removed with lens implant  . Hernia repair  06/08/2014    WITH MESH  . Inguinal hernia repair Right 06/08/2014    Procedure: RIGHT INGUINAL HERNIA REPAIR WITH MESH;  Surgeon: Jackolyn Confer, MD;  Location: [facility];  Service: General;  Laterality: Right;  . Insertion of mesh Right 06/08/2014    Procedure: INSERTION OF MESH;  Surgeon: Jackolyn Confer, MD;  Location: [facility];  Service: General;  Laterality: Right;  . Transurethral resection of  prostate N/A 07/19/2014    Procedure: TRANSURETHRAL RESECTION OF THE PROSTATE WITH GYRUS INSTRUMENTS;  Surgeon: Malka So, MD;  Location: [facility];  Service: Urology;  Laterality: N/A;   Family History  Problem Relation Age of Onset  . CAD Brother     Not premature  . Heart failure Father     Died in his 49s   History  Substance Use Topics  . Smoking status: Former Smoker -- 1.00 packs/day for 60 years    Types: Cigarettes    Quit date: 09/22/2003  . Smokeless tobacco: Never Used     Comment: quit 6 years ago, about 1 ppd X 60+ years  . Alcohol Use: No    Review of Systems  Constitutional: Positive for fever (100.7) and appetite change. Negative for diaphoresis.  HENT: Negative for sore throat.   Eyes: Negative for visual disturbance.  Respiratory: Positive for cough (mild, occasional). Negative for shortness of breath.   Cardiovascular: Negative for chest pain.  Gastrointestinal: Positive for nausea and diarrhea. Negative for vomiting and abdominal pain.  Genitourinary: Negative for dysuria and difficulty urinating.  Musculoskeletal: Negative for back pain and neck stiffness.  Skin: Negative for rash.  Neurological: Negative for syncope and headaches.      Allergies  Penicillins  Home Medications   Prior to Admission medications   Medication Sig Start Date End Date Taking? Authorizing Provider  ciprofloxacin (CIPRO)  500 MG tablet Take 1 tablet (500 mg total) by mouth 2 (two) times daily. One po bid x 7 days 07/22/14   Milton Ferguson, MD  docusate sodium (COLACE) 100 MG capsule Take 1 capsule (100 mg total) by mouth every 12 (twelve) hours. 06/16/14   Elnora Morrison, MD  DULoxetine (CYMBALTA) 30 MG capsule Take 30 mg by mouth daily. 07/17/14   Historical Provider, MD  Fluticasone-Salmeterol (ADVAIR DISKUS) 250-50 MCG/DOSE AEPB Inhale 1 puff into the lungs every 12 (twelve) hours. 9 am and 9 pm    Historical Provider, MD  gabapentin (NEURONTIN) 100 MG capsule Take 200 mg by  mouth 2 (two) times daily.  07/12/14   Historical Provider, MD  HYDROcodone-acetaminophen (NORCO) 5-325 MG per tablet Take 1 tablet by mouth every 4 (four) hours as needed for moderate pain. 06/08/14   Jackolyn Confer, MD  HYDROcodone-acetaminophen (NORCO/VICODIN) 5-325 MG per tablet Take 1 tablet by mouth 4 (four) times daily. 6 am 12pm 6 pm 12 am    Historical Provider, MD  tiotropium (SPIRIVA) 18 MCG inhalation capsule Place 18 mcg into inhaler and inhale every morning.     Historical Provider, MD   BP 153/69 mmHg  Pulse 71  Temp(Src) 98.2 F (36.8 C) (Oral)  Resp 18  Ht '5\' 10"'$  (1.778 m)  Wt 170 lb (77.111 kg)  BMI 24.39 kg/m2  SpO2 96% Physical Exam  Constitutional: He is oriented to person, place, and time. He appears well-developed and well-nourished. No distress.  HENT:  Head: Normocephalic and atraumatic.  Mouth/Throat: Mucous membranes are dry.  Eyes: Conjunctivae and EOM are normal.  Neck: Normal range of motion.  Cardiovascular: Normal rate, regular rhythm, normal heart sounds and intact distal pulses.  Exam reveals no gallop and no friction rub.   No murmur heard. Pulmonary/Chest: Effort normal and breath sounds normal. No respiratory distress. He has no wheezes. He has no rales.  Abdominal: Soft. He exhibits no distension. There is no tenderness. There is no guarding.  Musculoskeletal: He exhibits no edema.  Neurological: He is alert and oriented to person, place, and time.  Skin: Skin is warm and dry. He is not diaphoretic.  Nursing note and vitals reviewed.   ED Course  Procedures (including critical care time) Labs Review Labs Reviewed - No data to display  Imaging Review No results found.   EKG Interpretation None      MDM   Final diagnoses:  None   81 year old male with a history of chronic kidney disease stage IV, BPH, COPD, lung cancer (remote), presents with concern for diarrhea and decreased po beginning yesterday. Patient reports he went to a  picnic at assisted living facility and had food including potato salad and afterwards had significant amounts of diarrhea every hour. On physical presentation to the emergency department he reports his diarrhea has not resolved and he is feeling well. Ordered screening labs given significant amounts of diarrhea to evaluate for worsening renal function which showed creatinine change from 1.7 to 3.   This is likely secondary to dehydration. Patient was given a bolus of normal saline. Assisted-living facility had also reported a temperature of 100.6, however patient does not report any significant infectious symptoms other than diarrhea and is afebrile on arrival to the emergency department.  Labs show leukocytosis to 18, and given presence of reported fever with this (while likely related to diarrheal illness), will check urinalysis and CXR (pt reports mild cough) for other potential sources of fever.  Consulted hospitalist for  admission.     Gareth Morgan, MD 04/04/15 2351

## 2015-04-04 NOTE — ED Notes (Signed)
Diarrhea since yesterday afternoon.  He had gone on picinic outing w/[facility] yesterday.  States he got really hot, then had ice cream. Has had nausea and diarrhea since.

## 2015-04-05 ENCOUNTER — Encounter (HOSPITAL_COMMUNITY): Payer: Self-pay | Admitting: Internal Medicine

## 2015-04-05 ENCOUNTER — Inpatient Hospital Stay (HOSPITAL_COMMUNITY): Payer: Medicare Other

## 2015-04-05 DIAGNOSIS — R739 Hyperglycemia, unspecified: Secondary | ICD-10-CM | POA: Diagnosis present

## 2015-04-05 DIAGNOSIS — N179 Acute kidney failure, unspecified: Secondary | ICD-10-CM | POA: Diagnosis present

## 2015-04-05 DIAGNOSIS — R197 Diarrhea, unspecified: Secondary | ICD-10-CM

## 2015-04-05 LAB — URINALYSIS, ROUTINE W REFLEX MICROSCOPIC
BILIRUBIN URINE: NEGATIVE
Glucose, UA: NEGATIVE mg/dL
KETONES UR: NEGATIVE mg/dL
Leukocytes, UA: NEGATIVE
Nitrite: NEGATIVE
UROBILINOGEN UA: 0.2 mg/dL (ref 0.0–1.0)
pH: 5.5 (ref 5.0–8.0)

## 2015-04-05 LAB — URINE MICROSCOPIC-ADD ON

## 2015-04-05 LAB — MRSA PCR SCREENING: MRSA BY PCR: NEGATIVE

## 2015-04-05 LAB — BASIC METABOLIC PANEL
ANION GAP: 8 (ref 5–15)
BUN: 43 mg/dL — AB (ref 6–20)
CO2: 22 mmol/L (ref 22–32)
Calcium: 7.9 mg/dL — ABNORMAL LOW (ref 8.9–10.3)
Chloride: 111 mmol/L (ref 101–111)
Creatinine, Ser: 2.4 mg/dL — ABNORMAL HIGH (ref 0.61–1.24)
GFR calc non Af Amer: 22 mL/min — ABNORMAL LOW (ref >60)
GFR, EST AFRICAN AMERICAN: 26 mL/min — AB (ref >60)
GLUCOSE: 132 mg/dL — AB (ref 65–99)
POTASSIUM: 4 mmol/L (ref 3.5–5.1)
SODIUM: 141 mmol/L (ref 135–145)

## 2015-04-05 LAB — CREATININE, URINE, RANDOM: Creatinine, Urine: 165.08 mg/dL

## 2015-04-05 LAB — CLOSTRIDIUM DIFFICILE BY PCR: Toxigenic C. Difficile by PCR: POSITIVE — AB

## 2015-04-05 LAB — SODIUM, URINE, RANDOM: SODIUM UR: 71 mmol/L

## 2015-04-05 MED ORDER — SODIUM CHLORIDE 0.9 % IV SOLN
INTRAVENOUS | Status: DC
  Administered 2015-04-05 – 2015-04-06 (×2): via INTRAVENOUS

## 2015-04-05 MED ORDER — ACETAMINOPHEN 325 MG PO TABS
650.0000 mg | ORAL_TABLET | Freq: Four times a day (QID) | ORAL | Status: DC | PRN
Start: 2015-04-05 — End: 2015-04-07

## 2015-04-05 MED ORDER — TIOTROPIUM BROMIDE MONOHYDRATE 18 MCG IN CAPS
18.0000 ug | ORAL_CAPSULE | Freq: Every day | RESPIRATORY_TRACT | Status: DC
  Administered 2015-04-05: 18 ug via RESPIRATORY_TRACT
  Filled 2015-04-05 (×2): qty 5

## 2015-04-05 MED ORDER — METRONIDAZOLE 500 MG PO TABS
500.0000 mg | ORAL_TABLET | Freq: Three times a day (TID) | ORAL | Status: DC
  Administered 2015-04-05 – 2015-04-07 (×9): 500 mg via ORAL
  Filled 2015-04-05 (×9): qty 1

## 2015-04-05 MED ORDER — ENOXAPARIN SODIUM 30 MG/0.3ML ~~LOC~~ SOLN
30.0000 mg | SUBCUTANEOUS | Status: DC
Start: 2015-04-05 — End: 2015-04-07
  Administered 2015-04-05 – 2015-04-07 (×3): 30 mg via SUBCUTANEOUS
  Filled 2015-04-05 (×3): qty 0.3

## 2015-04-05 MED ORDER — SODIUM CHLORIDE 0.9 % IV SOLN
INTRAVENOUS | Status: DC
  Administered 2015-04-05 (×2): via INTRAVENOUS

## 2015-04-05 MED ORDER — MOMETASONE FURO-FORMOTEROL FUM 100-5 MCG/ACT IN AERO
2.0000 | INHALATION_SPRAY | Freq: Two times a day (BID) | RESPIRATORY_TRACT | Status: DC
  Administered 2015-04-05 – 2015-04-07 (×5): 2 via RESPIRATORY_TRACT
  Filled 2015-04-05: qty 8.8

## 2015-04-05 MED ORDER — GABAPENTIN 100 MG PO CAPS
200.0000 mg | ORAL_CAPSULE | Freq: Two times a day (BID) | ORAL | Status: DC
  Administered 2015-04-05 – 2015-04-07 (×6): 200 mg via ORAL
  Filled 2015-04-05 (×6): qty 2

## 2015-04-05 MED ORDER — DULOXETINE HCL 30 MG PO CPEP
30.0000 mg | ORAL_CAPSULE | Freq: Every day | ORAL | Status: DC
  Administered 2015-04-05 – 2015-04-07 (×3): 30 mg via ORAL
  Filled 2015-04-05 (×3): qty 1

## 2015-04-05 MED ORDER — ACETAMINOPHEN 650 MG RE SUPP
650.0000 mg | Freq: Four times a day (QID) | RECTAL | Status: DC | PRN
Start: 2015-04-05 — End: 2015-04-07

## 2015-04-05 NOTE — Progress Notes (Signed)
Subjective: 81 year old male with a history of COPD, C KD stage IV, baseline creatinine 2.0 came with diarrhea. Stool for C. difficile was positive and patient found to be in acute kidney injury. Patient started on metronidazole  Filed Vitals:   04/05/15 1414  BP: 148/118  Pulse: 85  Temp: 97.3 F (36.3 C)  Resp: 20    Chest: Clear Bilaterally Heart : S1S2 RRR Abdomen: Soft, nontender Ext : No edema Neuro: Alert, oriented x 3  A/P  Acute kidney injury Diarrhea, positive C. difficile  COPD  Continue IV fluids, today creatinine is 2.40. Will check BMP in a.m. Continue by mouth Flagyl Check CBC in a.m.     [facility] Hospitalist [phone]

## 2015-04-05 NOTE — Care Management Important Message (Signed)
Important Message  Patient Details  Name: Paul Gray MRN: [id number] Date of Birth: Jul 21, 1923   Medicare Important Message Given:  Yes-second notification given    Sherald Barge, RN 04/05/2015, 1:54 PM

## 2015-04-05 NOTE — Clinical Social Work Note (Signed)
Clinical Social Work Assessment  Patient Details  Name: Paul Gray MRN: [id number] Date of Birth: 1923/05/21  Date of referral:  04/05/15               Reason for consult:  Facility Placement                Permission sought to share information with:    Permission granted to share information::     Name::        Agency::     Relationship::     Contact Information:     Housing/Transportation Living arrangements for the past 2 months:  [place] of Information:  Patient Patient Interpreter Needed:  None Criminal Activity/Legal Involvement Pertinent to Current Situation/Hospitalization:  No - Comment as needed Significant Relationships:  Adult Children Lives with:  Facility Resident Do you feel safe going back to the place where you live?  Yes Need for family participation in patient care:  Yes (Comment)  Care giving concerns: Facility resident.    Social Worker assessment / plan:  CSW met with patient and discussed CSW role in patient's discharge planning. Patient stated that he has been a resident at [facility] living for a little over a year. He reported that he uses a single prong cane to assist with ambulation. Patient advised that he has two sons and that one lives here and one lives in [place].  He advised that he has a grandson that lives near [place].  Patient advised that she has good family support and that they visit him often.  Patient reported that at the onset of his present illness he had been with the facility on a picnic and that on the way back they stopped and got ice cream.  He indicated that he believes that he got overheated while on the picnic.  Patient stated that he enjoys being a resident at [facility] and that he wants to go back upon discharge.  CSW spoke with Sharyn Lull at [facility] who confirmed patient's statements.  She advised that patient could return to the facility upon discharge.   Employment status:  Retired Radiation protection practitioner:  Medicare PT Recommendations:    Information / Referral to community resources:     Patient/Family's Response to care:  Patient is agreeable to return to SNF.  Patient/Family's Understanding of and Emotional Response to Diagnosis, Current Treatment, and Prognosis:  Patient understands that due to his current status he will need to continue residing in [facility].    Emotional Assessment Appearance:  Developmentally appropriate Attitude/Demeanor/Rapport:   (Cooperative) Affect (typically observed):  Calm Orientation:  Oriented to Self, Oriented to Place, Oriented to Situation, Oriented to  Time Alcohol / Substance use:  Not Applicable Psych involvement (Current and /or in the community):  No (Comment)  Discharge Needs  Concerns to be addressed:    Readmission within the last 30 days:  No Current discharge risk:  None Barriers to Discharge:  No Barriers Identified   Ihor Gully, LCSW 04/05/2015, 11:34 AM [phone]

## 2015-04-05 NOTE — Progress Notes (Signed)
[name] Groner the patient's son called and informed the patient is in the process of being transported to the floor.

## 2015-04-05 NOTE — H&P (Signed)
Paul Gray is an 81 y.o. male.    Astronomer Complaint: diarrhea HPI: 81 yo male with Copd, CKD stage 4 (baseline creat 2.0), pneumonia, apparently had diarrhea starting yesterday.  10+ times.  Loose stool.  Denies fever, chills, n/v, abd pain, diarrhea, brbpr, black stool.  Pt was not looking good at assisted living and therefore presented to ED and was found to have ARF.    Past Medical History  Diagnosis Date  . Back pain   . BPH (benign prostatic hyperplasia)   . S/P colonoscopy 2002, 2007    Hyperplastic polyps 2002; pancolonic diverticula 2007  . Hx of adenomatous colonic polyps   . COPD (chronic obstructive pulmonary disease)   . CKD (chronic kidney disease) stage 4, GFR 15-29 ml/min     Creatinine 2.0  . Shortness of breath   . Lung cancer     Past Surgical History  Procedure Laterality Date  . Hernia surgery x 3      Inguinal hernia repair  . Cholecystectomy    . Appendectomy    . Radiofrequency ablation for lung cancer    . Eye surgery      cataracts removed with lens implant  . Hernia repair  06/08/2014    WITH MESH  . Inguinal hernia repair Right 06/08/2014    Procedure: RIGHT INGUINAL HERNIA REPAIR WITH MESH;  Surgeon: Jackolyn Confer, MD;  Location: [facility];  Service: General;  Laterality: Right;  . Insertion of mesh Right 06/08/2014    Procedure: INSERTION OF MESH;  Surgeon: Jackolyn Confer, MD;  Location: [facility];  Service: General;  Laterality: Right;  . Transurethral resection of prostate N/A 07/19/2014    Procedure: TRANSURETHRAL RESECTION OF THE PROSTATE WITH GYRUS INSTRUMENTS;  Surgeon: Malka So, MD;  Location: [facility];  Service: Urology;  Laterality: N/A;    Family History  Problem Relation Age of Onset  . CAD Brother     Not premature  . Heart failure Father     Died in his 37s   Social History:  reports that he quit smoking about 11 years ago. His smoking use included Cigarettes. He has a 60 pack-year smoking history. He has  never used smokeless tobacco. He reports that he does not drink alcohol or use illicit drugs.  Allergies:  Allergies  Allergen Reactions  . Penicillins Swelling  Medications reviewed   Results for orders placed or performed during the hospital encounter of 04/04/15 (from the past 48 hour(s))  Comprehensive metabolic panel     Status: Abnormal   Collection Time: 04/04/15 10:10 PM  Result Value Ref Range   Sodium 142 135 - 145 mmol/L   Potassium 4.5 3.5 - 5.1 mmol/L   Chloride 106 101 - 111 mmol/L   CO2 25 22 - 32 mmol/L   Glucose, Bld 128 (H) 65 - 99 mg/dL   BUN 49 (H) 6 - 20 mg/dL   Creatinine, Ser 3.01 (H) 0.61 - 1.24 mg/dL   Calcium 8.6 (L) 8.9 - 10.3 mg/dL   Total Protein 7.3 6.5 - 8.1 g/dL   Albumin 4.0 3.5 - 5.0 g/dL   AST 23 15 - 41 U/L   ALT 17 17 - 63 U/L   Alkaline Phosphatase 104 38 - 126 U/L   Total Bilirubin 0.7 0.3 - 1.2 mg/dL   GFR calc non Af Amer 17 (L) >60 mL/min   GFR calc Af Amer 20 (L) >60 mL/min    Comment: (NOTE) The  eGFR has been calculated using the CKD EPI equation. This calculation has not been validated in all clinical situations. eGFR's persistently <60 mL/min signify possible Chronic Kidney Disease.    Anion gap 11 5 - 15  CBC with Differential     Status: Abnormal   Collection Time: 04/04/15 10:10 PM  Result Value Ref Range   WBC 18.8 (H) 4.0 - 10.5 K/uL   RBC 4.92 4.22 - 5.81 MIL/uL   Hemoglobin 14.8 13.0 - 17.0 g/dL   HCT 46.2 39.0 - 52.0 %   MCV 93.9 78.0 - 100.0 fL   MCH 30.1 26.0 - 34.0 pg   MCHC 32.0 30.0 - 36.0 g/dL   RDW 14.9 11.5 - 15.5 %   Platelets 175 150 - 400 K/uL   Neutrophils Relative % 81 (H) 43 - 77 %   Neutro Abs 15.3 (H) 1.7 - 7.7 K/uL   Lymphocytes Relative 14 12 - 46 %   Lymphs Abs 2.5 0.7 - 4.0 K/uL   Monocytes Relative 5 3 - 12 %   Monocytes Absolute 0.9 0.1 - 1.0 K/uL   Eosinophils Relative 0 0 - 5 %   Eosinophils Absolute 0.0 0.0 - 0.7 K/uL   Basophils Relative 0 0 - 1 %   Basophils Absolute 0.0 0.0 - 0.1  K/uL   Dg Chest 2 View  04/04/2015   CLINICAL DATA:  Diarrhea since yesterday. Nausea and diarrhea since PICC neck yesterday. History of lung cancer, COPD, former smoker.  EXAM: CHEST  2 VIEW  COMPARISON:  06/08/2014  FINDINGS: Normal heart size and pulmonary vascularity. Segmental elevation of the right hemidiaphragm. No focal airspace disease or consolidation. Probable emphysema. No blunting of costophrenic angles. No pneumothorax. Scarring in the left lung apex may be postoperative or due to radiation therapy. Calcification of the aorta. Degenerative changes in the spine and shoulders. No significant change since prior study.  IMPRESSION: Chronic changes in the chest including emphysematous changes in the lungs and scarring in the left apex. No evidence of active pulmonary disease.   Electronically Signed   By: Lucienne Capers M.D.   On: 04/04/2015 23:55    Review of Systems  Constitutional: Negative.   HENT: Negative.   Eyes: Negative.   Respiratory: Negative.   Cardiovascular: Negative.   Gastrointestinal: Positive for diarrhea. Negative for heartburn, nausea, vomiting, abdominal pain, constipation, blood in stool and melena.  Genitourinary: Negative.   Musculoskeletal: Negative.   Skin: Negative.   Neurological: Negative.   Endo/Heme/Allergies: Negative.   Psychiatric/Behavioral: Negative.     Blood pressure 128/74, pulse 84, temperature 98 F (36.7 C), temperature source Oral, resp. rate 16, height _0  (1.778 m), weight 77.111 kg (170 lb), SpO2 96 %. Physical Exam  Constitutional: He is oriented to person, place, and time. He appears well-developed and well-nourished.  HENT:  Head: Normocephalic and atraumatic.  Mouth/Throat: No oropharyngeal exudate.  Eyes: Conjunctivae and EOM are normal. Pupils are equal, round, and reactive to light. No scleral icterus.  Neck: Normal range of motion. Neck supple. No JVD present. No tracheal deviation present. No thyromegaly present.   Cardiovascular: Normal rate and regular rhythm.  Exam reveals no gallop and no friction rub.   No murmur heard. Respiratory: Breath sounds normal. No respiratory distress. He has no wheezes. He has no rales.  GI: Soft. Bowel sounds are normal. He exhibits no distension. There is no tenderness. There is no rebound and no guarding.  Musculoskeletal: Normal range of motion. He exhibits no  edema or tenderness.  Neurological: He is alert and oriented to person, place, and time. He has normal reflexes. He displays normal reflexes. No cranial nerve deficit. He exhibits normal muscle tone. Coordination normal.  Skin: Skin is warm and dry. No rash noted. No erythema. No pallor.  Psychiatric: He has a normal mood and affect. His behavior is normal. Judgment and thought content normal.     Assessment/Plan ARF Check urine sodium, urine creatinine, urine eosinophisl Renal ultrasound Hydrate with ns iv  Diarrhea Check stool for fecal leukocytes, culture, c.diff.  Start on flagyl 565m po tid  Copd Cont current tx  DVT, scd, lovenox  Code Status DNR  KJEHIEL, KOEPP7/15/2016, 12:15 AM

## 2015-04-05 NOTE — ED Notes (Signed)
Hospitalist at bedside 

## 2015-04-05 NOTE — Care Management Note (Signed)
Case Management Note  Patient Details  Name: Paul Gray MRN: [id number] Date of Birth: Dec 27, 1922  Expected Discharge Date:                  Expected Discharge Plan:  Assisted Living / Rest Home  In-House Referral:  Clinical Social Work  Discharge planning Services  CM Consult  Post Acute Care Choice:  NA Choice offered to:  NA  DME Arranged:    DME Agency:     HH Arranged:    [place] Agency:     Status of Service:  Completed, signed off  Medicare Important Message Given:  Yes-second notification given Date Medicare IM Given:    Medicare IM give by:    Date Additional Medicare IM Given:    Additional Medicare Important Message give by:     If discussed at [place] of Stay Meetings, dates discussed:    Additional Comments: Pt is from [facility]. Pt admitted with AKI. Pt plans to return to [facility] at [place] is aware of discharge plan and will see pt and arrange for return to facility. No CM needs anticipated.    Sherald Barge, RN 04/05/2015, 1:54 PM

## 2015-04-06 DIAGNOSIS — A0472 Enterocolitis due to Clostridium difficile, not specified as recurrent: Secondary | ICD-10-CM | POA: Diagnosis present

## 2015-04-06 DIAGNOSIS — A047 Enterocolitis due to Clostridium difficile: Principal | ICD-10-CM

## 2015-04-06 DIAGNOSIS — J449 Chronic obstructive pulmonary disease, unspecified: Secondary | ICD-10-CM

## 2015-04-06 LAB — FECAL LACTOFERRIN, QUANT: FECAL LACTOFERRIN: POSITIVE

## 2015-04-06 NOTE — Progress Notes (Signed)
[facility] PROGRESS NOTE  Abdurrahman Petersheim Penning VVO:[id number] DOB: [birth year] DOA: 04/04/2015 PCP: Alonza Bogus, MD  Assessment/Plan: 1. Acute kidney injury on C KD-  patient presented with diarrhea, acute kidney injury. BUN/creatinine slowly improving yesterday creatinine was 2.40.. Will check BMP in a.m. Continue IV fluids normal saline at 75 mL per hour. 2. C. difficile colitis- patient has positive stool C. difficile PCR, started on by mouth Flagyl. Diarrhea slowly improving. 3. COPD- stable, continue Dulera, Spiriva 4.  DVT prophylaxis- Lovenox  Code Status: DNR Family Communication: *No family at bedside Disposition Plan: SNF   Consultants:  None  Procedures:  *None  Antibiotics:  Flagyl  HPI/Subjective: 81 year old male with a history of COPD, C KD stage IV, baseline creatinine 2.0 came with diarrhea. Stool for C. difficile was positive and patient found to be in acute kidney injury. Patient started on metronidazole  Patient eating breakfast,  Denies any complaints this morning.  Objective: Filed Vitals:   04/06/15 0622  BP: 153/66  Pulse: 89  Temp: 97.9 F (36.6 C)  Resp: 20    Intake/Output Summary (Last 24 hours) at 04/06/15 1148 Last data filed at 04/06/15 0500  Gross per 24 hour  Intake 1692.5 ml  Output   1200 ml  Net  492.5 ml   Filed Weights   04/05/15 0100 04/05/15 0400 04/06/15 0622  Weight: 78.5 kg (173 lb 1 oz) 78.5 kg (173 lb 1 oz) 78.654 kg (173 lb 6.4 oz)    Exam:   General:  Appear in no acute distress  Cardiovascular: S1S2 RRR  Respiratory: Clear bilaterally  Abdomen: Soft, nontender, no organomegaly  Musculoskeletal: No edema of the lower extremities  Data Reviewed: Basic Metabolic Panel:  Recent Labs Lab 04/04/15 2210 04/05/15 1026  NA 142 141  K 4.5 4.0  CL 106 111  CO2 25 22  GLUCOSE 128* 132*  BUN 49* 43*  CREATININE 3.01* 2.40*  CALCIUM 8.6* 7.9*   Liver Function Tests:  Recent Labs Lab  04/04/15 2210  AST 23  ALT 17  ALKPHOS 104  BILITOT 0.7  PROT 7.3  ALBUMIN 4.0   No results for input(s): LIPASE, AMYLASE in the last 168 hours. No results for input(s): AMMONIA in the last 168 hours. CBC:  Recent Labs Lab 04/04/15 2210  WBC 18.8*  NEUTROABS 15.3*  HGB 14.8  HCT 46.2  MCV 93.9  PLT 175   Cardiac Enzymes: No results for input(s): CKTOTAL, CKMB, CKMBINDEX, TROPONINI in the last 168 hours. BNP (last 3 results) No results for input(s): BNP in the last 8760 hours.  ProBNP (last 3 results) No results for input(s): PROBNP in the last 8760 hours.  CBG: No results for input(s): GLUCAP in the last 168 hours.  Recent Results (from the past 240 hour(s))  MRSA PCR Screening     Status: None   Collection Time: 04/05/15 12:50 AM  Result Value Ref Range Status   MRSA by PCR NEGATIVE NEGATIVE Final    Comment:        The GeneXpert MRSA Assay (FDA approved for NASAL specimens only), is one component of a comprehensive MRSA colonization surveillance program. It is not intended to diagnose MRSA infection nor to guide or monitor treatment for MRSA infections.   Urine culture     Status: None (Preliminary result)   Collection Time: 04/05/15  5:50 AM  Result Value Ref Range Status   Specimen Description URINE, RANDOM  Final   Special Requests NONE  Final   Culture  Final    NO GROWTH < 24 HOURS Performed at [facility]    Report Status PENDING  Incomplete  Clostridium Difficile by PCR (not at [facility])     Status: Abnormal   Collection Time: 04/05/15  7:03 AM  Result Value Ref Range Status   C difficile by pcr POSITIVE (A) NEGATIVE Final    Comment: CRITICAL RESULT CALLED TO, READ BACK BY AND VERIFIED WITH: GRAY,M AT 1127 ON 04/05/2015 BY WOODS,M      Studies: Dg Chest 2 View  04/04/2015   CLINICAL DATA:  Diarrhea since yesterday. Nausea and diarrhea since PICC neck yesterday. History of lung cancer, COPD, former smoker.  EXAM: CHEST  2 VIEW   COMPARISON:  06/08/2014  FINDINGS: Normal heart size and pulmonary vascularity. Segmental elevation of the right hemidiaphragm. No focal airspace disease or consolidation. Probable emphysema. No blunting of costophrenic angles. No pneumothorax. Scarring in the left lung apex may be postoperative or due to radiation therapy. Calcification of the aorta. Degenerative changes in the spine and shoulders. No significant change since prior study.  IMPRESSION: Chronic changes in the chest including emphysematous changes in the lungs and scarring in the left apex. No evidence of active pulmonary disease.   Electronically Signed   By: Lucienne Capers M.D.   On: 04/04/2015 23:55   US Renal  04/05/2015   CLINICAL DATA:  Acute renal failure.  EXAM: RENAL / URINARY TRACT ULTRASOUND COMPLETE  COMPARISON:  CT scan of June 16, 2014  FINDINGS: Right Kidney:  Length: 10.0 cm. Multiple small simple cysts are noted, with the largest measuring 1 cm in midpole. Echogenicity within normal limits. No mass or hydronephrosis visualized.  Left Kidney:  Length: 10.5 cm. 5.2 cm simple exophytic cyst is seen arising from midpole. Echogenicity within normal limits. No mass or hydronephrosis visualized.  Bladder:  Not well distended.  Ureteral jets are not visualized.  IMPRESSION: Bilateral simple renal cysts. No hydronephrosis or renal obstruction is noted.   Electronically Signed   By: Marijo Conception, M.D.   On: 04/05/2015 09:43    Scheduled Meds: . DULoxetine  30 mg Oral Daily  . enoxaparin (LOVENOX) injection  30 mg Subcutaneous Q24H  . gabapentin  200 mg Oral BID  . metroNIDAZOLE  500 mg Oral 3 times per day  . mometasone-formoterol  2 puff Inhalation BID  . tiotropium  18 mcg Inhalation Daily   Continuous Infusions: . sodium chloride 75 mL/hr at 04/06/15 0500    Active Problems:   COPD (chronic obstructive pulmonary disease)   Acute kidney injury   Hyperglycemia   Acute renal failure    Time spent: 25  min    [facility] Pager [phone]*. If 7PM-7AM, please contact night-coverage at [email or web address], password [facility] 04/06/2015, 11:48 AM  LOS: 2 days

## 2015-04-07 DIAGNOSIS — N179 Acute kidney failure, unspecified: Secondary | ICD-10-CM | POA: Diagnosis present

## 2015-04-07 DIAGNOSIS — E860 Dehydration: Secondary | ICD-10-CM | POA: Diagnosis present

## 2015-04-07 DIAGNOSIS — N400 Enlarged prostate without lower urinary tract symptoms: Secondary | ICD-10-CM | POA: Diagnosis present

## 2015-04-07 DIAGNOSIS — N184 Chronic kidney disease, stage 4 (severe): Secondary | ICD-10-CM

## 2015-04-07 LAB — BASIC METABOLIC PANEL
ANION GAP: 4 — AB (ref 5–15)
BUN: 23 mg/dL — AB (ref 6–20)
CO2: 24 mmol/L (ref 22–32)
Calcium: 8.1 mg/dL — ABNORMAL LOW (ref 8.9–10.3)
Chloride: 113 mmol/L — ABNORMAL HIGH (ref 101–111)
Creatinine, Ser: 1.47 mg/dL — ABNORMAL HIGH (ref 0.61–1.24)
GFR, EST AFRICAN AMERICAN: 47 mL/min — AB (ref >60)
GFR, EST NON AFRICAN AMERICAN: 40 mL/min — AB (ref >60)
Glucose, Bld: 97 mg/dL (ref 65–99)
POTASSIUM: 3.8 mmol/L (ref 3.5–5.1)
SODIUM: 141 mmol/L (ref 135–145)

## 2015-04-07 LAB — CBC
HCT: 37.4 % — ABNORMAL LOW (ref 39.0–52.0)
HEMOGLOBIN: 12.3 g/dL — AB (ref 13.0–17.0)
MCH: 30 pg (ref 26.0–34.0)
MCHC: 32.9 g/dL (ref 30.0–36.0)
MCV: 91.2 fL (ref 78.0–100.0)
Platelets: 161 10*3/uL (ref 150–400)
RBC: 4.1 MIL/uL — AB (ref 4.22–5.81)
RDW: 14.6 % (ref 11.5–15.5)
WBC: 4.8 10*3/uL (ref 4.0–10.5)

## 2015-04-07 LAB — CLOSTRIDIUM DIFFICILE BY PCR: Toxigenic C. Difficile by PCR: NEGATIVE

## 2015-04-07 LAB — URINE CULTURE: Culture: NO GROWTH

## 2015-04-07 MED ORDER — METRONIDAZOLE 500 MG PO TABS: 500.0000 mg | ORAL_TABLET | Freq: Three times a day (TID) | ORAL | Status: DC

## 2015-04-07 NOTE — Discharge Summary (Addendum)
Physician Discharge Summary  Patient ID: Paul Gray MRN: [id number] DOB/AGE: 79-May-1924 79 y.o. Primary Care Physician:Bricia Taher L, MD Admit date: 04/04/2015 Discharge date: 04/07/2015    Discharge Diagnoses:   Principal Problem:   Enteritis due to Clostridium difficile Active Problems:   COPD (chronic obstructive pulmonary disease)   Acute kidney injury   Hyperglycemia   Acute renal failure   Acute renal failure superimposed on stage 4 chronic kidney disease   BPH (benign prostatic hyperplasia)   Dehydration     Medication List    TAKE these medications        ADVAIR DISKUS 250-50 MCG/DOSE Aepb  Generic drug:  Fluticasone-Salmeterol  Inhale 1 puff into the lungs every 12 (twelve) hours. 9 am and 9 pm     albuterol 108 (90 BASE) MCG/ACT inhaler  Commonly known as:  PROVENTIL HFA;VENTOLIN HFA  Inhale 2 puffs into the lungs every 6 (six) hours as needed for wheezing or shortness of breath.     docusate sodium 100 MG capsule  Commonly known as:  COLACE  Take 1 capsule (100 mg total) by mouth every 12 (twelve) hours.     donepezil 10 MG tablet  Commonly known as:  ARICEPT  Take 1 tablet by mouth daily.     DULoxetine 30 MG capsule  Commonly known as:  CYMBALTA  Take 30 mg by mouth daily.     finasteride 5 MG tablet  Commonly known as:  PROSCAR  Take 5 mg by mouth daily.     fluticasone 50 MCG/ACT nasal spray  Commonly known as:  FLONASE  Place 2 sprays into both nostrils daily.     gabapentin 300 MG capsule  Commonly known as:  NEURONTIN  Take 300 mg by mouth 3 (three) times daily.     guaiFENesin 600 MG 12 hr tablet  Commonly known as:  MUCINEX  Take 600 mg by mouth daily.     loratadine 10 MG tablet  Commonly known as:  CLARITIN  Take 10 mg by mouth daily.     metroNIDAZOLE 500 MG tablet  Commonly known as:  FLAGYL  Take 1 tablet (500 mg total) by mouth every 8 (eight) hours.     tiotropium 18 MCG inhalation capsule  Commonly known as:   SPIRIVA  Place 18 mcg into inhaler and inhale every morning.     traMADol-acetaminophen 37.5-325 MG per tablet  Commonly known as:  ULTRACET  Take 1 tablet by mouth daily. And as needed.        Discharged Condition: Improved    Consults: None  Significant Diagnostic Studies: Dg Chest 2 View  04/04/2015   CLINICAL DATA:  Diarrhea since yesterday. Nausea and diarrhea since PICC neck yesterday. History of lung cancer, COPD, former smoker.  EXAM: CHEST  2 VIEW  COMPARISON:  06/08/2014  FINDINGS: Normal heart size and pulmonary vascularity. Segmental elevation of the right hemidiaphragm. No focal airspace disease or consolidation. Probable emphysema. No blunting of costophrenic angles. No pneumothorax. Scarring in the left lung apex may be postoperative or due to radiation therapy. Calcification of the aorta. Degenerative changes in the spine and shoulders. No significant change since prior study.  IMPRESSION: Chronic changes in the chest including emphysematous changes in the lungs and scarring in the left apex. No evidence of active pulmonary disease.   Electronically Signed   By: Lucienne Capers M.D.   On: 04/04/2015 23:55   US Renal  04/05/2015   CLINICAL DATA:  Acute renal failure.  EXAM: RENAL / URINARY TRACT ULTRASOUND COMPLETE  COMPARISON:  CT scan of June 16, 2014  FINDINGS: Right Kidney:  Length: 10.0 cm. Multiple small simple cysts are noted, with the largest measuring 1 cm in midpole. Echogenicity within normal limits. No mass or hydronephrosis visualized.  Left Kidney:  Length: 10.5 cm. 5.2 cm simple exophytic cyst is seen arising from midpole. Echogenicity within normal limits. No mass or hydronephrosis visualized.  Bladder:  Not well distended.  Ureteral jets are not visualized.  IMPRESSION: Bilateral simple renal cysts. No hydronephrosis or renal obstruction is noted.   Electronically Signed   By: Marijo Conception, M.D.   On: 04/05/2015 09:43    Lab Results: Basic Metabolic  Panel:  Recent Labs  04/05/15 1026 04/07/15 0602  NA 141 141  K 4.0 3.8  CL 111 113*  CO2 22 24  GLUCOSE 132* 97  BUN 43* 23*  CREATININE 2.40* 1.47*  CALCIUM 7.9* 8.1*   Liver Function Tests:  Recent Labs  04/04/15 2210  AST 23  ALT 17  ALKPHOS 104  BILITOT 0.7  PROT 7.3  ALBUMIN 4.0     CBC:  Recent Labs  04/04/15 2210 04/07/15 0602  WBC 18.8* 4.8  NEUTROABS 15.3*  --   HGB 14.8 12.3*  HCT 46.2 37.4*  MCV 93.9 91.2  PLT 175 161    Recent Results (from the past 240 hour(s))  MRSA PCR Screening     Status: None   Collection Time: 04/05/15 12:50 AM  Result Value Ref Range Status   MRSA by PCR NEGATIVE NEGATIVE Final    Comment:        The GeneXpert MRSA Assay (FDA approved for NASAL specimens only), is one component of a comprehensive MRSA colonization surveillance program. It is not intended to diagnose MRSA infection nor to guide or monitor treatment for MRSA infections.   Urine culture     Status: None (Preliminary result)   Collection Time: 04/05/15  5:50 AM  Result Value Ref Range Status   Specimen Description URINE, RANDOM  Final   Special Requests NONE  Final   Culture   Final    NO GROWTH < 24 HOURS Performed at [facility]    Report Status PENDING  Incomplete  Clostridium Difficile by PCR (not at [facility])     Status: Abnormal   Collection Time: 04/05/15  7:03 AM  Result Value Ref Range Status   C difficile by pcr POSITIVE (A) NEGATIVE Final    Comment: CRITICAL RESULT CALLED TO, READ BACK BY AND VERIFIED WITH: GRAY,M AT 1127 ON 04/05/2015 BY [facility] Course: This is an 81 year old who lives in an assisted living facility and who was in his usual state of fair health when he developed increasing problems with diarrhea. This has been going on for 2 or 3 days. He came to the emergency department where he was noted to be dehydrated and had acute kidney injury. He is known to have chronic renal failure. He was positive  for C. difficile and started on Flagyl and his diarrhea resolved promptly. His renal function has returned to baseline with hydration. At baseline he has COPD which has been pretty stable, and he has had renal and lung cancer. Both of these are in remission. He will be sent for transfer back to his assisted living facility on po antibiotics.  Discharge Exam: Blood pressure 145/63, pulse 60, temperature 98.4 F (36.9 C), temperature source Oral, resp.  rate 20, height '5\' 10"'$  (1.778 m), weight 78.5 kg (173 lb 1 oz), SpO2 84 %. He is awake and alert. His chest is clear. His abdomen is soft. Bowel sounds are normal  Disposition: Back to assisted living facility .      Signed: Christasia Angeletti L   04/07/2015, 9:56 AM

## 2015-04-07 NOTE — Progress Notes (Signed)
Pt discharged back to [facility] per [name].  Pt's IV site D/C'd and WDL.  Pt's VSS.  FL2 packet prepared and son instructed to give to facility upon arrival.  Son providing transport back to [facility].  Discharge instructions including FL2 packet provided to patient and son.  Verbalized understanding.  Pt waiting to leave floor at this time.

## 2015-04-07 NOTE — Progress Notes (Addendum)
Subjective: He says he feels great and wants to go home. He is not having any more diarrhea.  Objective: Vital signs in last 24 hours: Temp:  [97.8 F (36.6 C)-98.4 F (36.9 C)] 98.4 F (36.9 C) (07/17 0520) Pulse Rate:  [60-96] 60 (07/17 0520) Resp:  [20] 20 (07/17 0520) BP: (133-145)/(62-69) 145/63 mmHg (07/17 0520) SpO2:  [84 %-97 %] 84 % (07/17 0831) Weight:  [78.5 kg (173 lb 1 oz)] 78.5 kg (173 lb 1 oz) (07/17 0500) Weight change: -0.154 kg (-5.4 oz) Last BM Date: 04/07/15  Intake/Output from previous day: 07/16 0701 - 07/17 0700 In: 2715 [P.O.:840; I.V.:1875] Out: -   PHYSICAL EXAM General appearance: alert, cooperative and no distress Resp: diminished breath sounds bilaterally Cardio: regular rate and rhythm, S1, S2 normal, no murmur, click, rub or gallop GI: soft, non-tender; bowel sounds normal; no masses,  no organomegaly Extremities: extremities normal, atraumatic, no cyanosis or edema  Lab Results:  Results for orders placed or performed during the hospital encounter of 04/04/15 (from the past 48 hour(s))  Basic metabolic panel     Status: Abnormal   Collection Time: 04/05/15 10:26 AM  Result Value Ref Range   Sodium 141 135 - 145 mmol/L   Potassium 4.0 3.5 - 5.1 mmol/L   Chloride 111 101 - 111 mmol/L   CO2 22 22 - 32 mmol/L   Glucose, Bld 132 (H) 65 - 99 mg/dL   BUN 43 (H) 6 - 20 mg/dL   Creatinine, Ser 2.40 (H) 0.61 - 1.24 mg/dL   Calcium 7.9 (L) 8.9 - 10.3 mg/dL   GFR calc non Af Amer 22 (L) >60 mL/min   GFR calc Af Amer 26 (L) >60 mL/min    Comment: (NOTE) The eGFR has been calculated using the CKD EPI equation. This calculation has not been validated in all clinical situations. eGFR's persistently <60 mL/min signify possible Chronic Kidney Disease.    Anion gap 8 5 - 15  CBC     Status: Abnormal   Collection Time: 04/07/15  6:02 AM  Result Value Ref Range   WBC 4.8 4.0 - 10.5 K/uL   RBC 4.10 (L) 4.22 - 5.81 MIL/uL   Hemoglobin 12.3 (L) 13.0 -  17.0 g/dL   HCT 37.4 (L) 39.0 - 52.0 %   MCV 91.2 78.0 - 100.0 fL   MCH 30.0 26.0 - 34.0 pg   MCHC 32.9 30.0 - 36.0 g/dL   RDW 14.6 11.5 - 15.5 %   Platelets 161 150 - 400 K/uL  Basic metabolic panel     Status: Abnormal   Collection Time: 04/07/15  6:02 AM  Result Value Ref Range   Sodium 141 135 - 145 mmol/L   Potassium 3.8 3.5 - 5.1 mmol/L   Chloride 113 (H) 101 - 111 mmol/L   CO2 24 22 - 32 mmol/L   Glucose, Bld 97 65 - 99 mg/dL   BUN 23 (H) 6 - 20 mg/dL   Creatinine, Ser 1.47 (H) 0.61 - 1.24 mg/dL   Calcium 8.1 (L) 8.9 - 10.3 mg/dL   GFR calc non Af Amer 40 (L) >60 mL/min   GFR calc Af Amer 47 (L) >60 mL/min    Comment: (NOTE) The eGFR has been calculated using the CKD EPI equation. This calculation has not been validated in all clinical situations. eGFR's persistently <60 mL/min signify possible Chronic Kidney Disease.    Anion gap 4 (L) 5 - 15    ABGS No results for input(s):  PHART, PO2ART, TCO2, HCO3 in the last 72 hours.  Invalid input(s): PCO2 CULTURES Recent Results (from the past 240 hour(s))  MRSA PCR Screening     Status: None   Collection Time: 04/05/15 12:50 AM  Result Value Ref Range Status   MRSA by PCR NEGATIVE NEGATIVE Final    Comment:        The GeneXpert MRSA Assay (FDA approved for NASAL specimens only), is one component of a comprehensive MRSA colonization surveillance program. It is not intended to diagnose MRSA infection nor to guide or monitor treatment for MRSA infections.   Urine culture     Status: None (Preliminary result)   Collection Time: 04/05/15  5:50 AM  Result Value Ref Range Status   Specimen Description URINE, RANDOM  Final   Special Requests NONE  Final   Culture   Final    NO GROWTH < 24 HOURS Performed at [facility]    Report Status PENDING  Incomplete  Clostridium Difficile by PCR (not at [facility])     Status: Abnormal   Collection Time: 04/05/15  7:03 AM  Result Value Ref Range Status   C difficile by  pcr POSITIVE (A) NEGATIVE Final    Comment: CRITICAL RESULT CALLED TO, READ BACK BY AND VERIFIED WITH: GRAY,M AT 1127 ON 04/05/2015 BY WOODS,M    Studies/Results: No results found.  Medications:  Prior to Admission:  Prescriptions prior to admission  Medication Sig Dispense Refill Last Dose  . albuterol (PROVENTIL HFA;VENTOLIN HFA) 108 (90 BASE) MCG/ACT inhaler Inhale 2 puffs into the lungs every 6 (six) hours as needed for wheezing or shortness of breath.   unknown  . docusate sodium (COLACE) 100 MG capsule Take 1 capsule (100 mg total) by mouth every 12 (twelve) hours. 20 capsule 0 04/04/2015 at Unknown time  . donepezil (ARICEPT) 10 MG tablet Take 1 tablet by mouth daily.   04/04/2015 at Unknown time  . DULoxetine (CYMBALTA) 30 MG capsule Take 30 mg by mouth daily.   04/04/2015 at 0900  . finasteride (PROSCAR) 5 MG tablet Take 5 mg by mouth daily.   04/04/2015 at Unknown time  . fluticasone (FLONASE) 50 MCG/ACT nasal spray Place 2 sprays into both nostrils daily.   04/04/2015 at Unknown time  . Fluticasone-Salmeterol (ADVAIR DISKUS) 250-50 MCG/DOSE AEPB Inhale 1 puff into the lungs every 12 (twelve) hours. 9 am and 9 pm   04/04/2015 at Unknown time  . gabapentin (NEURONTIN) 300 MG capsule Take 300 mg by mouth 3 (three) times daily.   04/04/2015 at Unknown time  . guaiFENesin (MUCINEX) 600 MG 12 hr tablet Take 600 mg by mouth daily.   04/04/2015 at Unknown time  . loratadine (CLARITIN) 10 MG tablet Take 10 mg by mouth daily.   04/04/2015 at Unknown time  . tiotropium (SPIRIVA) 18 MCG inhalation capsule Place 18 mcg into inhaler and inhale every morning.    04/04/2015 at 1900  . traMADol-acetaminophen (ULTRACET) 37.5-325 MG per tablet Take 1 tablet by mouth daily. And as needed.   04/04/2015 at Unknown time   Scheduled: . DULoxetine  30 mg Oral Daily  . enoxaparin (LOVENOX) injection  30 mg Subcutaneous Q24H  . gabapentin  200 mg Oral BID  . metroNIDAZOLE  500 mg Oral 3 times per day  .  mometasone-formoterol  2 puff Inhalation BID  . tiotropium  18 mcg Inhalation Daily   Continuous: . sodium chloride 75 mL/hr at 04/06/15 1720   BLT:[id number] **OR** acetaminophen  Assesment:  He was admitted with enteritis due to Clostridium difficile. He also had dehydration. He has improved with Flagyl and hydration. He had acute kidney injury that I think was associated with his dehydration and that has improved and he is back to his baseline chronic renal dysfunction. At baseline he has COPD which is doing well. He has a history of a renal and lung cancer both of which are in remission Active Problems:   COPD (chronic obstructive pulmonary disease)   Acute kidney injury   Hyperglycemia   Acute renal failure   Enteritis due to Clostridium difficile    Plan: I think he is ready for transfer back to his assisted living facility.    LOS: 3 days   Sujata Maines L 04/07/2015, 9:46 AM

## 2015-04-09 LAB — STOOL CULTURE: Special Requests: NORMAL

## 2015-04-27 ENCOUNTER — Inpatient Hospital Stay (HOSPITAL_COMMUNITY)
Admission: EM | Admit: 2015-04-27 | Discharge: 2015-05-01 | DRG: 372 | Disposition: A | Discharge status: Skilled Nursing Facility | Payer: Medicare Other | Attending: Internal Medicine | Admitting: Internal Medicine

## 2015-04-27 ENCOUNTER — Encounter (HOSPITAL_COMMUNITY): Payer: Self-pay | Admitting: *Deleted

## 2015-04-27 DIAGNOSIS — A047 Enterocolitis due to Clostridium difficile: Principal | ICD-10-CM | POA: Diagnosis present

## 2015-04-27 DIAGNOSIS — Z79891 Long term (current) use of opiate analgesic: Secondary | ICD-10-CM

## 2015-04-27 DIAGNOSIS — J441 Chronic obstructive pulmonary disease with (acute) exacerbation: Secondary | ICD-10-CM

## 2015-04-27 DIAGNOSIS — F0390 Unspecified dementia without behavioral disturbance: Secondary | ICD-10-CM | POA: Diagnosis present

## 2015-04-27 DIAGNOSIS — E875 Hyperkalemia: Secondary | ICD-10-CM | POA: Diagnosis present

## 2015-04-27 DIAGNOSIS — N401 Enlarged prostate with lower urinary tract symptoms: Secondary | ICD-10-CM | POA: Diagnosis present

## 2015-04-27 DIAGNOSIS — Z79899 Other long term (current) drug therapy: Secondary | ICD-10-CM

## 2015-04-27 DIAGNOSIS — A0472 Enterocolitis due to Clostridium difficile, not specified as recurrent: Secondary | ICD-10-CM | POA: Diagnosis present

## 2015-04-27 DIAGNOSIS — Z7951 Long term (current) use of inhaled steroids: Secondary | ICD-10-CM

## 2015-04-27 DIAGNOSIS — Z85118 Personal history of other malignant neoplasm of bronchus and lung: Secondary | ICD-10-CM

## 2015-04-27 DIAGNOSIS — E860 Dehydration: Secondary | ICD-10-CM | POA: Diagnosis present

## 2015-04-27 DIAGNOSIS — R338 Other retention of urine: Secondary | ICD-10-CM | POA: Diagnosis present

## 2015-04-27 DIAGNOSIS — Z880 Allergy status to penicillin: Secondary | ICD-10-CM

## 2015-04-27 DIAGNOSIS — R000 Tachycardia, unspecified: Secondary | ICD-10-CM | POA: Diagnosis present

## 2015-04-27 DIAGNOSIS — Z87891 Personal history of nicotine dependence: Secondary | ICD-10-CM

## 2015-04-27 DIAGNOSIS — J449 Chronic obstructive pulmonary disease, unspecified: Secondary | ICD-10-CM | POA: Diagnosis present

## 2015-04-27 DIAGNOSIS — N400 Enlarged prostate without lower urinary tract symptoms: Secondary | ICD-10-CM | POA: Diagnosis present

## 2015-04-27 DIAGNOSIS — N183 Chronic kidney disease, stage 3 unspecified: Secondary | ICD-10-CM | POA: Diagnosis present

## 2015-04-27 DIAGNOSIS — Z86010 Personal history of colonic polyps: Secondary | ICD-10-CM

## 2015-04-27 DIAGNOSIS — Z66 Do not resuscitate: Secondary | ICD-10-CM | POA: Diagnosis present

## 2015-04-27 DIAGNOSIS — N179 Acute kidney failure, unspecified: Secondary | ICD-10-CM | POA: Diagnosis present

## 2015-04-27 DIAGNOSIS — H9190 Unspecified hearing loss, unspecified ear: Secondary | ICD-10-CM | POA: Diagnosis present

## 2015-04-27 HISTORY — DX: Enterocolitis due to Clostridium difficile, not specified as recurrent: A04.72

## 2015-04-27 LAB — COMPREHENSIVE METABOLIC PANEL
ALBUMIN: 3.4 g/dL — AB (ref 3.5–5.0)
ALK PHOS: 78 U/L (ref 38–126)
ALT: 16 U/L — AB (ref 17–63)
AST: 20 U/L (ref 15–41)
Anion gap: 10 (ref 5–15)
BUN: 29 mg/dL — ABNORMAL HIGH (ref 6–20)
CHLORIDE: 102 mmol/L (ref 101–111)
CO2: 21 mmol/L — ABNORMAL LOW (ref 22–32)
CREATININE: 2.22 mg/dL — AB (ref 0.61–1.24)
Calcium: 8.8 mg/dL — ABNORMAL LOW (ref 8.9–10.3)
GFR calc Af Amer: 28 mL/min — ABNORMAL LOW (ref >60)
GFR, EST NON AFRICAN AMERICAN: 24 mL/min — AB (ref >60)
Glucose, Bld: 114 mg/dL — ABNORMAL HIGH (ref 65–99)
Potassium: 5.4 mmol/L — ABNORMAL HIGH (ref 3.5–5.1)
SODIUM: 133 mmol/L — AB (ref 135–145)
Total Bilirubin: 0.5 mg/dL (ref 0.3–1.2)
Total Protein: 6.4 g/dL — ABNORMAL LOW (ref 6.5–8.1)

## 2015-04-27 LAB — CBC WITH DIFFERENTIAL/PLATELET
BASOS PCT: 1 % (ref 0–1)
Basophils Absolute: 0.1 10*3/uL (ref 0.0–0.1)
EOS ABS: 0.3 10*3/uL (ref 0.0–0.7)
Eosinophils Relative: 3 % (ref 0–5)
HCT: 42.2 % (ref 39.0–52.0)
HEMOGLOBIN: 13.7 g/dL (ref 13.0–17.0)
Lymphocytes Relative: 20 % (ref 12–46)
Lymphs Abs: 2.3 10*3/uL (ref 0.7–4.0)
MCH: 29.7 pg (ref 26.0–34.0)
MCHC: 32.5 g/dL (ref 30.0–36.0)
MCV: 91.3 fL (ref 78.0–100.0)
MONO ABS: 1.3 10*3/uL — AB (ref 0.1–1.0)
MONOS PCT: 12 % (ref 3–12)
NEUTROS ABS: 7.3 10*3/uL (ref 1.7–7.7)
Neutrophils Relative %: 64 % (ref 43–77)
Platelets: 243 10*3/uL (ref 150–400)
RBC: 4.62 MIL/uL (ref 4.22–5.81)
RDW: 14.8 % (ref 11.5–15.5)
WBC: 11.3 10*3/uL — ABNORMAL HIGH (ref 4.0–10.5)

## 2015-04-27 MED ORDER — SODIUM CHLORIDE 0.9 % IV BOLUS (SEPSIS)
1000.0000 mL | Freq: Once | INTRAVENOUS | Status: AC
  Administered 2015-04-27: 1000 mL via INTRAVENOUS

## 2015-04-27 NOTE — ED Notes (Signed)
Patient presents wih son stating 3 weeks ago he was in [facility] for C diff, completed all antibiotics but loose stools are back again

## 2015-04-27 NOTE — ED Notes (Signed)
MD at bedside. 

## 2015-04-27 NOTE — ED Provider Notes (Signed)
CSN: [id number]     Arrival date & time 04/27/15  2007 History   First MD Initiated Contact with Patient 04/27/15 2156     Chief Complaint  Patient presents with  . Diarrhea     (Consider location/radiation/quality/duration/timing/severity/associated sxs/prior Treatment) Patient is a 81 y.o. male presenting with diarrhea. The history is provided by the patient. No language interpreter was used.  Diarrhea Quality:  Watery Severity:  Severe Onset quality:  Gradual Number of episodes:  10 Duration:  1 day Timing:  Constant Progression:  Unchanged Relieved by:  Nothing Worsened by:  Nothing tried Ineffective treatments:  Anti-motility medications (imodium) Associated symptoms: fever   Fever:    Duration:  1 day   Timing:  Rare   Max temp PTA (F):  101   Temp source:  Oral   Progression:  Resolved Risk factors: no recent antibiotic use, no sick contacts, no suspicious food intake and no travel to endemic areas   Risk factors comment:  Patient diagnosed with C. Diff 3 weeks ago and now thinks it is back   Past Medical History  Diagnosis Date  . Back pain   . BPH (benign prostatic hyperplasia)   . S/P colonoscopy 2002, 2007    Hyperplastic polyps 2002; pancolonic diverticula 2007  . Hx of adenomatous colonic polyps   . COPD (chronic obstructive pulmonary disease)   . CKD (chronic kidney disease) stage 4, GFR 15-29 ml/min     Creatinine 2.0  . Shortness of breath   . Lung cancer   . C. difficile diarrhea    Past Surgical History  Procedure Laterality Date  . Hernia surgery x 3      Inguinal hernia repair  . Cholecystectomy    . Appendectomy    . Radiofrequency ablation for lung cancer    . Eye surgery      cataracts removed with lens implant  . Hernia repair  06/08/2014    WITH MESH  . Inguinal hernia repair Right 06/08/2014    Procedure: RIGHT INGUINAL HERNIA REPAIR WITH MESH;  Surgeon: Jackolyn Confer, MD;  Location: [place] [facility];  Service: General;  Laterality: Right;  .  Insertion of mesh Right 06/08/2014    Procedure: INSERTION OF MESH;  Surgeon: Jackolyn Confer, MD;  Location: [place];  Service: General;  Laterality: Right;  . Transurethral resection of prostate N/A 07/19/2014    Procedure: TRANSURETHRAL RESECTION OF THE PROSTATE WITH GYRUS INSTRUMENTS;  Surgeon: Malka So, MD;  Location: [facility];  Service: Urology;  Laterality: N/A;   Family History  Problem Relation Age of Onset  . CAD Brother     Not premature  . Heart failure Father     Died in his 75s   History  Substance Use Topics  . Smoking status: Former Smoker -- 1.00 packs/day for 60 years    Types: Cigarettes    Quit date: 09/22/2003  . Smokeless tobacco: Never Used     Comment: quit 6 years ago, about 1 ppd X 60+ years  . Alcohol Use: No    Review of Systems  Constitutional: Positive for fever.  Gastrointestinal: Positive for diarrhea.  All other systems reviewed and are negative.     Allergies  Penicillins  Home Medications   Prior to Admission medications   Medication Sig Start Date End Date Taking? Authorizing Provider  albuterol (PROVENTIL HFA;VENTOLIN HFA) 108 (90 BASE) MCG/ACT inhaler Inhale 2 puffs into the lungs every 6 (six) hours as needed for wheezing or shortness  of breath.    Historical Provider, MD  docusate sodium (COLACE) 100 MG capsule Take 1 capsule (100 mg total) by mouth every 12 (twelve) hours. 06/16/14   Elnora Morrison, MD  donepezil (ARICEPT) 10 MG tablet Take 1 tablet by mouth daily. 03/07/15   Historical Provider, MD  DULoxetine (CYMBALTA) 30 MG capsule Take 30 mg by mouth daily. 07/17/14   Historical Provider, MD  finasteride (PROSCAR) 5 MG tablet Take 5 mg by mouth daily.    Historical Provider, MD  fluticasone (FLONASE) 50 MCG/ACT nasal spray Place 2 sprays into both nostrils daily.    Historical Provider, MD  Fluticasone-Salmeterol (ADVAIR DISKUS) 250-50 MCG/DOSE AEPB Inhale 1 puff into the lungs every 12 (twelve) hours. 9 am and 9 pm    Historical  Provider, MD  gabapentin (NEURONTIN) 300 MG capsule Take 300 mg by mouth 3 (three) times daily.    Historical Provider, MD  guaiFENesin (MUCINEX) 600 MG 12 hr tablet Take 600 mg by mouth daily.    Historical Provider, MD  loratadine (CLARITIN) 10 MG tablet Take 10 mg by mouth daily.    Historical Provider, MD  metroNIDAZOLE (FLAGYL) 500 MG tablet Take 1 tablet (500 mg total) by mouth every 8 (eight) hours. 04/07/15   Sinda Du, MD  tiotropium (SPIRIVA) 18 MCG inhalation capsule Place 18 mcg into inhaler and inhale every morning.     Historical Provider, MD  traMADol-acetaminophen (ULTRACET) 37.5-325 MG per tablet Take 1 tablet by mouth daily. And as needed.    Historical Provider, MD   BP 121/63 mmHg  Pulse 94  Temp(Src) 98.3 F (36.8 C) (Oral)  Resp 22  SpO2 97% Physical Exam  Constitutional: He is oriented to person, place, and time. He appears well-developed and well-nourished. No distress.  HENT:  Head: Normocephalic and atraumatic.  Eyes: Conjunctivae and EOM are normal.  Neck: Normal range of motion.  Cardiovascular: Regular rhythm.  Exam reveals no gallop and no friction rub.   No murmur heard. tachycardic  Pulmonary/Chest: Effort normal and breath sounds normal. He has no wheezes. He has no rales. He exhibits no tenderness.  Abdominal: Soft. He exhibits no distension. There is no tenderness. There is no rebound.  Musculoskeletal: Normal range of motion.  Neurological: He is alert and oriented to person, place, and time. Coordination normal.  Speech is goal-oriented. Moves limbs without ataxia.   Skin: Skin is warm and dry.  Psychiatric: He has a normal mood and affect. His behavior is normal.  Nursing note and vitals reviewed.   ED Course  Procedures (including critical care time) Labs Review Labs Reviewed  CBC WITH DIFFERENTIAL/PLATELET - Abnormal; Notable for the following:    WBC 11.3 (*)    Monocytes Absolute 1.3 (*)    All other components within normal limits   COMPREHENSIVE METABOLIC PANEL - Abnormal; Notable for the following:    Sodium 133 (*)    Potassium 5.4 (*)    CO2 21 (*)    Glucose, Bld 114 (*)    BUN 29 (*)    Creatinine, Ser 2.22 (*)    Calcium 8.8 (*)    Total Protein 6.4 (*)    Albumin 3.4 (*)    ALT 16 (*)    GFR calc non Af Amer 24 (*)    GFR calc Af Amer 28 (*)    All other components within normal limits  C DIFFICILE QUICK SCAN W PCR REFLEX  URINALYSIS, ROUTINE W REFLEX MICROSCOPIC (NOT AT [facility])  BASIC  METABOLIC PANEL  CBC    Imaging Review No results found.   EKG Interpretation None      MDM   Final diagnoses:  Dehydration  AKI (acute kidney injury)    11:22 PM Labs show mildly elevated WBC and elevated creatinine. Patient tachycardic on arrival without other acute changes.   Patient will be admitted for IV fluids.   Alvina Chou, PA-C 04/28/15 0501  Carmin Muskrat, MD 05/03/15 [phone]

## 2015-04-28 DIAGNOSIS — N400 Enlarged prostate without lower urinary tract symptoms: Secondary | ICD-10-CM | POA: Diagnosis present

## 2015-04-28 DIAGNOSIS — R000 Tachycardia, unspecified: Secondary | ICD-10-CM | POA: Diagnosis present

## 2015-04-28 DIAGNOSIS — Z79899 Other long term (current) drug therapy: Secondary | ICD-10-CM | POA: Diagnosis not present

## 2015-04-28 DIAGNOSIS — E860 Dehydration: Secondary | ICD-10-CM

## 2015-04-28 DIAGNOSIS — N183 Chronic kidney disease, stage 3 unspecified: Secondary | ICD-10-CM | POA: Diagnosis present

## 2015-04-28 DIAGNOSIS — Z880 Allergy status to penicillin: Secondary | ICD-10-CM | POA: Diagnosis not present

## 2015-04-28 DIAGNOSIS — R338 Other retention of urine: Secondary | ICD-10-CM | POA: Diagnosis present

## 2015-04-28 DIAGNOSIS — R197 Diarrhea, unspecified: Secondary | ICD-10-CM | POA: Diagnosis not present

## 2015-04-28 DIAGNOSIS — J439 Emphysema, unspecified: Secondary | ICD-10-CM | POA: Diagnosis not present

## 2015-04-28 DIAGNOSIS — E875 Hyperkalemia: Secondary | ICD-10-CM | POA: Diagnosis present

## 2015-04-28 DIAGNOSIS — N179 Acute kidney failure, unspecified: Secondary | ICD-10-CM | POA: Diagnosis present

## 2015-04-28 DIAGNOSIS — N401 Enlarged prostate with lower urinary tract symptoms: Secondary | ICD-10-CM | POA: Diagnosis present

## 2015-04-28 DIAGNOSIS — J449 Chronic obstructive pulmonary disease, unspecified: Secondary | ICD-10-CM | POA: Diagnosis present

## 2015-04-28 DIAGNOSIS — Z87891 Personal history of nicotine dependence: Secondary | ICD-10-CM | POA: Diagnosis not present

## 2015-04-28 DIAGNOSIS — Z7951 Long term (current) use of inhaled steroids: Secondary | ICD-10-CM | POA: Diagnosis not present

## 2015-04-28 DIAGNOSIS — Z66 Do not resuscitate: Secondary | ICD-10-CM | POA: Diagnosis present

## 2015-04-28 DIAGNOSIS — A047 Enterocolitis due to Clostridium difficile: Secondary | ICD-10-CM | POA: Diagnosis present

## 2015-04-28 DIAGNOSIS — H9190 Unspecified hearing loss, unspecified ear: Secondary | ICD-10-CM | POA: Diagnosis present

## 2015-04-28 DIAGNOSIS — J441 Chronic obstructive pulmonary disease with (acute) exacerbation: Secondary | ICD-10-CM

## 2015-04-28 DIAGNOSIS — Z86010 Personal history of colonic polyps: Secondary | ICD-10-CM | POA: Diagnosis not present

## 2015-04-28 DIAGNOSIS — Z79891 Long term (current) use of opiate analgesic: Secondary | ICD-10-CM | POA: Diagnosis not present

## 2015-04-28 DIAGNOSIS — Z85118 Personal history of other malignant neoplasm of bronchus and lung: Secondary | ICD-10-CM | POA: Diagnosis not present

## 2015-04-28 DIAGNOSIS — F0390 Unspecified dementia without behavioral disturbance: Secondary | ICD-10-CM | POA: Diagnosis present

## 2015-04-28 LAB — C DIFFICILE QUICK SCREEN W PCR REFLEX
C DIFFICILE (CDIFF) INTERP: POSITIVE
C Diff antigen: POSITIVE — AB
C Diff toxin: POSITIVE — AB

## 2015-04-28 LAB — BASIC METABOLIC PANEL
Anion gap: 7 (ref 5–15)
BUN: 26 mg/dL — ABNORMAL HIGH (ref 6–20)
CHLORIDE: 109 mmol/L (ref 101–111)
CO2: 23 mmol/L (ref 22–32)
CREATININE: 1.91 mg/dL — AB (ref 0.61–1.24)
Calcium: 8.4 mg/dL — ABNORMAL LOW (ref 8.9–10.3)
GFR calc Af Amer: 34 mL/min — ABNORMAL LOW (ref >60)
GFR, EST NON AFRICAN AMERICAN: 29 mL/min — AB (ref >60)
GLUCOSE: 112 mg/dL — AB (ref 65–99)
Potassium: 4.9 mmol/L (ref 3.5–5.1)
Sodium: 139 mmol/L (ref 135–145)

## 2015-04-28 LAB — CBC
HCT: 37.6 % — ABNORMAL LOW (ref 39.0–52.0)
Hemoglobin: 12.1 g/dL — ABNORMAL LOW (ref 13.0–17.0)
MCH: 29.3 pg (ref 26.0–34.0)
MCHC: 32.2 g/dL (ref 30.0–36.0)
MCV: 91 fL (ref 78.0–100.0)
Platelets: 209 10*3/uL (ref 150–400)
RBC: 4.13 MIL/uL — AB (ref 4.22–5.81)
RDW: 14.8 % (ref 11.5–15.5)
WBC: 10.4 10*3/uL (ref 4.0–10.5)

## 2015-04-28 LAB — URINALYSIS, ROUTINE W REFLEX MICROSCOPIC
Bilirubin Urine: NEGATIVE
Glucose, UA: NEGATIVE mg/dL
Hgb urine dipstick: NEGATIVE
KETONES UR: NEGATIVE mg/dL
LEUKOCYTES UA: NEGATIVE
NITRITE: NEGATIVE
PROTEIN: NEGATIVE mg/dL
SPECIFIC GRAVITY, URINE: 1.006 (ref 1.005–1.030)
Urobilinogen, UA: 0.2 mg/dL (ref 0.0–1.0)
pH: 5 (ref 5.0–8.0)

## 2015-04-28 MED ORDER — ONDANSETRON HCL 4 MG/2ML IJ SOLN: 4.0000 mg | Freq: Four times a day (QID) | INTRAMUSCULAR | Status: DC | PRN

## 2015-04-28 MED ORDER — GUAIFENESIN ER 600 MG PO TB12
600.0000 mg | ORAL_TABLET | Freq: Every day | ORAL | Status: DC
  Administered 2015-04-28 – 2015-05-01 (×4): 600 mg via ORAL
  Filled 2015-04-28 (×4): qty 1

## 2015-04-28 MED ORDER — TRAMADOL-ACETAMINOPHEN 37.5-325 MG PO TABS
1.0000 | ORAL_TABLET | Freq: Every day | ORAL | Status: DC
  Administered 2015-04-28 – 2015-05-01 (×4): 1 via ORAL
  Filled 2015-04-28 (×4): qty 1

## 2015-04-28 MED ORDER — VANCOMYCIN 50 MG/ML ORAL SOLUTION
125.0000 mg | Freq: Four times a day (QID) | ORAL | Status: DC
Start: 2015-04-28 — End: 2015-05-01
  Administered 2015-04-28 – 2015-05-01 (×15): 125 mg via ORAL
  Filled 2015-04-28 (×19): qty 2.5

## 2015-04-28 MED ORDER — GABAPENTIN 300 MG PO CAPS
300.0000 mg | ORAL_CAPSULE | Freq: Three times a day (TID) | ORAL | Status: DC
  Administered 2015-04-28 – 2015-05-01 (×10): 300 mg via ORAL
  Filled 2015-04-28 (×9): qty 1

## 2015-04-28 MED ORDER — TRAMADOL-ACETAMINOPHEN 37.5-325 MG PO TABS: 1.0000 | ORAL_TABLET | Freq: Every day | ORAL | Status: DC | PRN

## 2015-04-28 MED ORDER — ENOXAPARIN SODIUM 30 MG/0.3ML ~~LOC~~ SOLN
30.0000 mg | Freq: Every day | SUBCUTANEOUS | Status: DC
  Administered 2015-04-28 – 2015-05-01 (×4): 30 mg via SUBCUTANEOUS
  Filled 2015-04-28 (×4): qty 0.3

## 2015-04-28 MED ORDER — DULOXETINE HCL 30 MG PO CPEP
30.0000 mg | ORAL_CAPSULE | Freq: Every day | ORAL | Status: DC
  Administered 2015-04-28 – 2015-05-01 (×4): 30 mg via ORAL
  Filled 2015-04-28 (×4): qty 1

## 2015-04-28 MED ORDER — ACETAMINOPHEN 325 MG PO TABS: 650.0000 mg | ORAL_TABLET | Freq: Four times a day (QID) | ORAL | Status: DC | PRN

## 2015-04-28 MED ORDER — TIOTROPIUM BROMIDE MONOHYDRATE 18 MCG IN CAPS
18.0000 ug | ORAL_CAPSULE | Freq: Every day | RESPIRATORY_TRACT | Status: DC
  Administered 2015-04-28 – 2015-04-30 (×3): 18 ug via RESPIRATORY_TRACT
  Filled 2015-04-28 (×2): qty 5

## 2015-04-28 MED ORDER — SACCHAROMYCES BOULARDII 250 MG PO CAPS
250.0000 mg | ORAL_CAPSULE | Freq: Two times a day (BID) | ORAL | Status: DC
  Administered 2015-04-28 – 2015-04-30 (×6): 250 mg via ORAL
  Filled 2015-04-28 (×9): qty 1

## 2015-04-28 MED ORDER — ALBUTEROL SULFATE (2.5 MG/3ML) 0.083% IN NEBU: 2.5000 mg | INHALATION_SOLUTION | Freq: Four times a day (QID) | RESPIRATORY_TRACT | Status: DC | PRN

## 2015-04-28 MED ORDER — FINASTERIDE 5 MG PO TABS
5.0000 mg | ORAL_TABLET | Freq: Every day | ORAL | Status: DC
  Administered 2015-04-28 – 2015-05-01 (×4): 5 mg via ORAL
  Filled 2015-04-28 (×4): qty 1

## 2015-04-28 MED ORDER — LORATADINE 10 MG PO TABS
10.0000 mg | ORAL_TABLET | Freq: Every day | ORAL | Status: DC
  Administered 2015-04-28 – 2015-05-01 (×4): 10 mg via ORAL
  Filled 2015-04-28 (×4): qty 1

## 2015-04-28 MED ORDER — FLUTICASONE PROPIONATE 50 MCG/ACT NA SUSP
2.0000 | Freq: Every day | NASAL | Status: DC
  Administered 2015-04-28 – 2015-05-01 (×4): 2 via NASAL
  Filled 2015-04-28: qty 16

## 2015-04-28 MED ORDER — ONDANSETRON HCL 4 MG PO TABS: 4.0000 mg | ORAL_TABLET | Freq: Four times a day (QID) | ORAL | Status: DC | PRN

## 2015-04-28 MED ORDER — ENOXAPARIN SODIUM 40 MG/0.4ML ~~LOC~~ SOLN: 40.0000 mg | SUBCUTANEOUS | Status: DC

## 2015-04-28 MED ORDER — ALUM & MAG HYDROXIDE-SIMETH 200-200-20 MG/5ML PO SUSP: 30.0000 mL | Freq: Four times a day (QID) | ORAL | Status: DC | PRN

## 2015-04-28 MED ORDER — DONEPEZIL HCL 10 MG PO TABS
10.0000 mg | ORAL_TABLET | Freq: Every day | ORAL | Status: DC
  Administered 2015-04-28 – 2015-05-01 (×4): 10 mg via ORAL
  Filled 2015-04-28 (×4): qty 1

## 2015-04-28 MED ORDER — ALBUTEROL SULFATE HFA 108 (90 BASE) MCG/ACT IN AERS: 2.0000 | INHALATION_SPRAY | Freq: Four times a day (QID) | RESPIRATORY_TRACT | Status: DC | PRN

## 2015-04-28 MED ORDER — SODIUM CHLORIDE 0.9 % IV SOLN
INTRAVENOUS | Status: DC
  Administered 2015-04-28 – 2015-04-29 (×3): via INTRAVENOUS
  Administered 2015-04-29: 75 mL/h via INTRAVENOUS
  Administered 2015-04-29: 100 mL/h via INTRAVENOUS
  Administered 2015-04-30 (×2): via INTRAVENOUS

## 2015-04-28 MED ORDER — ACETAMINOPHEN 650 MG RE SUPP: 650.0000 mg | Freq: Four times a day (QID) | RECTAL | Status: DC | PRN

## 2015-04-28 MED ORDER — MOMETASONE FURO-FORMOTEROL FUM 100-5 MCG/ACT IN AERO
2.0000 | INHALATION_SPRAY | Freq: Two times a day (BID) | RESPIRATORY_TRACT | Status: DC
  Administered 2015-04-28 – 2015-04-30 (×5): 2 via RESPIRATORY_TRACT
  Filled 2015-04-28 (×2): qty 8.8

## 2015-04-28 NOTE — H&P (Addendum)
[facility] History and Physical  Paul Gray ZCH:[id number] DOB: [birth year] DOA: 04/27/2015   PCP: Alonza Bogus, MD    Chief Complaint: diarrhea and fever.  HPI: Paul Gray is a 81 y.o. male  With COPD CKD 3 who recently was admitted and discharged on 04/07/15 for C diff colitis. He was discharged on oral Flagyl. He is very hard of hearing and also a poor historian. He has dementia. His son is answering most of the questions. He lives in assisted living. He returns for recurrence of diarrhea- his son thinks he has had 5 episodes during the day and 2 int the ER. He had a fever of 101 today. His diarrhea probably started a few days ago. A med list from the facility reveals he received Lomotil on Thursday and Friday. He has had no appetite today and has barely eaten.   ROS: unable to obtain due to dementia  Past Medical History  Diagnosis Date  . Back pain   . BPH (benign prostatic hyperplasia)   . S/P colonoscopy 2002, 2007    Hyperplastic polyps 2002; pancolonic diverticula 2007  . Hx of adenomatous colonic polyps   . COPD (chronic obstructive pulmonary disease)   . CKD (chronic kidney disease) stage3      Creatinine 2.0  . Lung cancer   . C. difficile diarrhea     Past Surgical History  Procedure Laterality Date  . Hernia surgery x 3      Inguinal hernia repair  . Cholecystectomy    . Appendectomy    . Radiofrequency ablation for lung cancer    . Eye surgery      cataracts removed with lens implant  . Hernia repair  06/08/2014    WITH MESH  . Inguinal hernia repair Right 06/08/2014    Procedure: RIGHT INGUINAL HERNIA REPAIR WITH MESH;  Surgeon: Jackolyn Confer, MD;  Location: [place];  Service: General;  Laterality: Right;  . Insertion of mesh Right 06/08/2014    Procedure: INSERTION OF MESH;  Surgeon: Jackolyn Confer, MD;  Location: [place];  Service: General;  Laterality: Right;  . Transurethral resection of prostate N/A 07/19/2014    Procedure: TRANSURETHRAL  RESECTION OF THE PROSTATE WITH GYRUS INSTRUMENTS;  Surgeon: Malka So, MD;  Location: [facility];  Service: Urology;  Laterality: N/A;    Social History:does not smoke or drink alcohol Lives at [facility] assisted [facility] in [place]  Allergies  Allergen Reactions  . Penicillins Swelling    Family history:   Family History  Problem Relation Age of Onset  . CAD Brother     Not premature  . Heart failure Father     Died in his 59s      Prior to Admission medications   Medication Sig Start Date End Date Taking? Authorizing Provider  albuterol (PROVENTIL HFA;VENTOLIN HFA) 108 (90 BASE) MCG/ACT inhaler Inhale 2 puffs into the lungs every 6 (six) hours as needed for wheezing or shortness of breath.    Historical Provider, MD  docusate sodium (COLACE) 100 MG capsule Take 1 capsule (100 mg total) by mouth every 12 (twelve) hours. 06/16/14   Elnora Morrison, MD  donepezil (ARICEPT) 10 MG tablet Take 1 tablet by mouth daily. 03/07/15   Historical Provider, MD  DULoxetine (CYMBALTA) 30 MG capsule Take 30 mg by mouth daily. 07/17/14   Historical Provider, MD  finasteride (PROSCAR) 5 MG tablet Take 5 mg by mouth daily.    Historical Provider, MD  fluticasone (FLONASE) 50  MCG/ACT nasal spray Place 2 sprays into both nostrils daily.    Historical Provider, MD  Fluticasone-Salmeterol (ADVAIR DISKUS) 250-50 MCG/DOSE AEPB Inhale 1 puff into the lungs every 12 (twelve) hours. 9 am and 9 pm    Historical Provider, MD  gabapentin (NEURONTIN) 300 MG capsule Take 300 mg by mouth 3 (three) times daily.    Historical Provider, MD  guaiFENesin (MUCINEX) 600 MG 12 hr tablet Take 600 mg by mouth daily.    Historical Provider, MD  loratadine (CLARITIN) 10 MG tablet Take 10 mg by mouth daily.    Historical Provider, MD  tiotropium (SPIRIVA) 18 MCG inhalation capsule Place 18 mcg into inhaler and inhale every morning.     Historical Provider, MD  traMADol-acetaminophen (ULTRACET) 37.5-325 MG per tablet Take 1 tablet  by mouth daily. And as needed.    Historical Provider, MD     Physical Exam: Filed Vitals:   04/27/15 2309 04/28/15 0030 04/28/15 0100 04/28/15 0130  BP:  125/46 113/55 114/67  Pulse:  91 90 94  Temp: 99 F (37.2 C)     TempSrc: Rectal     Resp:      SpO2:  94% 90% 93%    General: elderly man laying in bed, appears quite comfortable, very hard of hearing HEENT: Normocephalic and Atraumatic, Mucous membranes pink                PERRLA; EOM intact; No scleral icterus,                 Nares: Patent, Oropharynx: Clear, Fair Dentition                 Neck: FROM, no cervical lymphadenopathy, thyromegaly, carotid bruit or JVD;  Breasts: deferred CHEST WALL: No tenderness  CHEST: Normal respiration, clear to auscultation bilaterally  HEART: Regular rate and rhythm; no murmurs rubs or gallops  BACK: No kyphosis or scoliosis; no CVA tenderness  GI: Positive Bowel Sounds, soft, non-tender; no masses, no organomegaly Rectal Exam: deferred MSK: No cyanosis, clubbing, or edema Genitalia: not examined  SKIN:  no rash or ulceration  CNS: Alert and Oriented x 4, Nonfocal exam, CN 2-12 intact  Labs on Admission:  Basic Metabolic Panel:  Recent Labs Lab 04/27/15 2045  NA 133*  K 5.4*  CL 102  CO2 21*  GLUCOSE 114*  BUN 29*  CREATININE 2.22*  CALCIUM 8.8*   Liver Function Tests:  Recent Labs Lab 04/27/15 2045  AST 20  ALT 16*  ALKPHOS 78  BILITOT 0.5  PROT 6.4*  ALBUMIN 3.4*   No results for input(s): LIPASE, AMYLASE in the last 168 hours. No results for input(s): AMMONIA in the last 168 hours. CBC:  Recent Labs Lab 04/27/15 2045  WBC 11.3*  NEUTROABS 7.3  HGB 13.7  HCT 42.2  MCV 91.3  PLT 243   Cardiac Enzymes: No results for input(s): CKTOTAL, CKMB, CKMBINDEX, TROPONINI in the last 168 hours.  BNP (last 3 results) No results for input(s): BNP in the last 8760 hours.  ProBNP (last 3 results) No results for input(s): PROBNP in the last 8760  hours.  CBG: No results for input(s): GLUCAP in the last 168 hours.  Radiological Exams on Admission: No results found.   Assessment/Plan Active Problems:   Acute kidney injury- CKD 3   Dehydration -probably due to diarrhea and poor PO intake.  - given 1 L bolus in ER- continuous IVF ordered  Hyperkalemia - likely from ARF- follow -  should improve with hydration  Diarrhea/ mild leukocytosis - recent C diff colitis treated with Flagyl - clear liquids for now - recurrence of diarrhea treated with Lomotil on Thursday and Friday - stool for C diff sent- started on Vancomycin and Florastor  COPD - cont inhalers  Dementia - cont Aricept  BPH - Proscar   Consulted:   Code Status: DNR  Family Communication: Jeneen Rinks Hesch-  POA DVT Prophylaxis:Lovenox  Time spent: 70 min  [place], MD [facility]  If 7PM-7AM, please contact night-coverage [email or web address] 04/28/2015, 1:51 AM

## 2015-04-28 NOTE — Progress Notes (Signed)
Patient admitted to the hospital earlier this morning by Dr. Wynelle Cleveland  Patient seen and examined. He reports continued loose stools. Denies any cough or shortness of breath  He has been admitted to the hospital with fever, dehydration and persistent loose stools. Stool C diff has returned positive. He was admitted for C diff last month and discharged on a course of flagyl. He has now been readmitted with persistent diarrhea, now started on oral vancomycin. Will continue with antibiotics. Continue IV fluids since he still appears somewhat dehydrated. Anticipate discharge back to ALF in 1-2 days once his diarrhea improves.  Kenyon Eichelberger

## 2015-04-28 NOTE — Evaluation (Signed)
Physical Therapy Evaluation Patient Details Name: Paul Gray MRN: [id number] DOB: Jun 04, 1923 Today's Date: 04/28/2015   History of Present Illness    81 y.o. male with COPD CKD 3, dementia who recently was admitted and discharged on 04/07/15 for C diff colitis. Returns with recurrence of diarrhea, fever of 101, ARF/AKI.  He is very hard of hearing and also a poor historian.     Clinical Impression  Pt presents likely near his baseline for functional mobility, able to perform basic tasks with minimal to no physical assistance and cues for safety.  Gait deferred with recurrent diarrhea, however anticipate pt will be able to return to ALF once medically stable.  Recommend PT at d/c, likely HHPT at ALF level as do not anticipate will need SNF level care, CSW please confirm [facility] can accommodate.  Will follow acutely and ask nursing staff to assist with OOB and walking using pt's ambulatory device.  Will update d/c recommendations if status declines, however optimistic for ALF/HHPT at this point.  Thank you,    Follow Up Recommendations Home health PT (assuming ALF able to provide supervision)    Equipment Recommendations  None recommended by PT    Recommendations for Other Services       Precautions / Restrictions Precautions Precautions: Fall Precaution Comments: use 4wheeled walker, up with assist, monitored room      Mobility  Bed Mobility Overal bed mobility: Modified Independent             General bed mobility comments: minimal encouraging cues to use rail as needed to roll into side>sit, no physical assistance needed  Transfers Overall transfer level: Needs assistance Equipment used: 4-wheeled walker Transfers: Sit to/from Stand Sit to Stand: Supervision         General transfer comment: standby supervision for safety and to observe technique, cues to slow down once standing and pause before going, mildly impulsive with turning to [employer] iwth chair and x1 cue for  control  Ambulation/Gait             General Gait Details: deferred gait w/ hx frequent bowel movements, though none during session; expect pt is ambulatory with standby assist using device  Stairs            Wheelchair Mobility    Modified Rankin (Stroke Patients Only)       Balance Overall balance assessment: Needs assistance Sitting-balance support: No upper extremity supported;Feet supported Sitting balance-Leahy Scale: Good     Standing balance support: During functional activity;Bilateral upper extremity supported;No upper extremity supported Standing balance-Leahy Scale: Fair Standing balance comment: uses RW for dynamic activities, able to stand without support for static               High Level Balance Comments: expect will struggle with more demanding activities             Pertinent Vitals/Pain Pain Assessment: No/denies pain    Home Living Family/patient expects to be discharged to:: Assisted living               Home Equipment: Walker - 4 wheels;Cane - single point Additional Comments: [facility] facility    Prior Function Level of Independence: Independent with assistive device(s) (unsure as pt poor historian, but states d/c from PT at ALF)               Hand Dominance        Extremity/Trunk Assessment   Upper Extremity Assessment: Overall WFL for tasks assessed  Lower Extremity Assessment: Overall WFL for tasks assessed         Communication   Communication: HOH  Cognition Arousal/Alertness: Awake/alert Behavior During Therapy: WFL for tasks assessed/performed Overall Cognitive Status: Within Functional Limits for tasks assessed       Memory:  (hx dementia, pleasantly confused and cooperative)              General Comments      Exercises        Assessment/Plan    PT Assessment Patient needs continued PT services  PT Diagnosis Difficulty walking   PT Problem List Decreased  safety awareness;Decreased cognition;Decreased mobility;Decreased balance;Decreased activity tolerance  PT Treatment Interventions DME instruction;Gait training;Functional mobility training;Therapeutic activities;Balance training;Patient/family education   PT Goals (Current goals can be found in the Care Plan section) Acute Rehab PT Goals PT Goal Formulation: Patient unable to participate in goal setting Time For Goal Achievement: 05/05/15 Potential to Achieve Goals: Good    Frequency Min 2X/week   Barriers to discharge        Co-evaluation               End of Session Equipment Utilized During Treatment: Gait belt Activity Tolerance: Patient tolerated treatment well Patient left: in chair;with call bell/phone within reach Nurse Communication: Mobility status         Time: 1045-1105 PT Time Calculation (min) (ACUTE ONLY): 20 min   Charges:   PT Evaluation $Initial PT Evaluation Tier I: 1 Procedure PT Treatments $Therapeutic Activity: 8-22 mins   PT G Codes:        Herbie Drape 04/28/2015, 11:08 AM

## 2015-04-29 DIAGNOSIS — N179 Acute kidney failure, unspecified: Secondary | ICD-10-CM

## 2015-04-29 LAB — BASIC METABOLIC PANEL
Anion gap: 6 (ref 5–15)
BUN: 14 mg/dL (ref 6–20)
CALCIUM: 8 mg/dL — AB (ref 8.9–10.3)
CHLORIDE: 110 mmol/L (ref 101–111)
CO2: 23 mmol/L (ref 22–32)
Creatinine, Ser: 1.66 mg/dL — ABNORMAL HIGH (ref 0.61–1.24)
GFR calc Af Amer: 40 mL/min — ABNORMAL LOW (ref >60)
GFR calc non Af Amer: 35 mL/min — ABNORMAL LOW (ref >60)
Glucose, Bld: 132 mg/dL — ABNORMAL HIGH (ref 65–99)
Potassium: 4.5 mmol/L (ref 3.5–5.1)
Sodium: 139 mmol/L (ref 135–145)

## 2015-04-29 LAB — CBC
HEMATOCRIT: 35.7 % — AB (ref 39.0–52.0)
HEMOGLOBIN: 11.3 g/dL — AB (ref 13.0–17.0)
MCH: 29.3 pg (ref 26.0–34.0)
MCHC: 31.7 g/dL (ref 30.0–36.0)
MCV: 92.5 fL (ref 78.0–100.0)
Platelets: 210 10*3/uL (ref 150–400)
RBC: 3.86 MIL/uL — ABNORMAL LOW (ref 4.22–5.81)
RDW: 15 % (ref 11.5–15.5)
WBC: 9.1 10*3/uL (ref 4.0–10.5)

## 2015-04-29 NOTE — Progress Notes (Addendum)
[facility] PROGRESS NOTE  Paul Gray GUR:[id number] DOB: 09/15/1923 DOA: 04/27/2015 PCP: Alonza Bogus, MD  Assessment/Plan: Principal Problem:   ARF (acute renal failure) Active Problems:   BPH (benign prostatic hypertrophy) with urinary retention   Acute kidney injury   Enteritis due to Clostridium difficile   Dehydration   Hyperkalemia   COPD (chronic obstructive pulmonary disease)   CKD (chronic kidney disease) stage 3, GFR 30-59 ml/min    Acute kidney injury- CKD , baseline creatinine around 1.6, creatinine back to baseline, however will continue IV fluids given continued diarrhea   Dehydration -probably due to diarrhea and poor PO intake.  Continue IV fluids  Hyperkalemia Likely from acute kidney injury, resolved  Diarrhea/ mild leukocytosis - recent C diff colitis treated with Flagyl - Advance to soft diet --  continue by mouth Vancomycin and Florastor for C. difficile colitis  COPD - cont inhalers  Dementia - cont Aricept  BPH - Proscar    Code Status:      Code Status Orders        Start     Ordered   04/28/15 0216  Do not attempt resuscitation (DNR)   Continuous    Question Answer Comment  In the event of cardiac or respiratory ARREST Do not call a "code blue"   In the event of cardiac or respiratory ARREST Do not perform Intubation, CPR, defibrillation or ACLS   In the event of cardiac or respiratory ARREST Use medication by any route, position, wound care, and other measures to relive pain and suffering. May use oxygen, suction and manual treatment of airway obstruction as needed for comfort.      04/28/15 0216    Advance Directive Documentation        Most Recent Value   Type of Advance Directive  Healthcare Power of Attorney   Pre-existing out of facility DNR order (yellow form or pink MOST form)     "MOST" Form in Place?       Family Communication: family updated about patient's clinical progress Disposition Plan:  As  above    Brief narrative: Paul Gray is a 81 y.o. male With COPD CKD 3 who recently was admitted and discharged on 04/07/15 for C diff colitis. He was discharged on oral Flagyl. He is very hard of hearing and also a poor historian. He has dementia. His son is answering most of the questions. He lives in assisted living. He returns for recurrence of diarrhea- his son thinks he has had 5 episodes during the day and 2 int the ER. He had a fever of 101 today. His diarrhea probably started a few days ago. A med list from the facility reveals he received Lomotil on Thursday and Friday. He has had no appetite today and has barely eaten.  Consultants:  None  Procedures:  None  Antibiotics: Anti-infectives    Start     Dose/Rate Route Frequency Ordered Stop   04/28/15 0145  vancomycin (VANCOCIN) 50 mg/mL oral solution 125 mg     125 mg Oral 4 times per day 04/28/15 0135           HPI/Subjective: Patient continues to have some diarrhea,  Objective: Filed Vitals:   04/28/15 2100 04/28/15 2117 04/29/15 0550 04/29/15 0926  BP:  147/69 114/43   Pulse:  105 86 91  Temp:  97.7 F (36.5 C) 98.2 F (36.8 C)   TempSrc:  Oral Oral   Resp:  $'18 16 18  'k$ SpO2: 90% 92% 94% 95%    Intake/Output Summary (Last 24 hours) at 04/29/15 1553 Last data filed at 04/29/15 0700  Gross per 24 hour  Intake 2996.67 ml  Output      0 ml  Net 2996.67 ml    Exam:  General: No acute respiratory distress Lungs: Clear to auscultation bilaterally without wheezes or crackles Cardiovascular: Regular rate and rhythm without murmur gallop or rub normal S1 and S2 Abdomen: Nontender, nondistended, soft, bowel sounds positive, no rebound, no ascites, no appreciable mass Extremities: No significant cyanosis, clubbing, or edema bilateral lower extremities     Data Review   Micro Results Recent Results (from the past 240 hour(s))  C difficile quick scan w PCR reflex     Status: Abnormal   Collection Time:  04/28/15  1:54 AM  Result Value Ref Range Status   C Diff antigen POSITIVE (A) NEGATIVE Corrected    Comment: CORRECTED ON 08/07 AT 0740: PREVIOUSLY REPORTED AS POSITIVE POSITIVE   C Diff toxin POSITIVE (A) NEGATIVE Corrected    Comment: CORRECTED ON 08/07 AT 0740: PREVIOUSLY REPORTED AS POSITIVE POSITIVE   C Diff interpretation Positive for toxigenic C. difficile  Final    Radiology Reports Dg Chest 2 View  04/04/2015   CLINICAL DATA:  Diarrhea since yesterday. Nausea and diarrhea since PICC neck yesterday. History of lung cancer, COPD, former smoker.  EXAM: CHEST  2 VIEW  COMPARISON:  06/08/2014  FINDINGS: Normal heart size and pulmonary vascularity. Segmental elevation of the right hemidiaphragm. No focal airspace disease or consolidation. Probable emphysema. No blunting of costophrenic angles. No pneumothorax. Scarring in the left lung apex may be postoperative or due to radiation therapy. Calcification of the aorta. Degenerative changes in the spine and shoulders. No significant change since prior study.  IMPRESSION: Chronic changes in the chest including emphysematous changes in the lungs and scarring in the left apex. No evidence of active pulmonary disease.   Electronically Signed   By: Lucienne Capers M.D.   On: 04/04/2015 23:55   US Renal  04/05/2015   CLINICAL DATA:  Acute renal failure.  EXAM: RENAL / URINARY TRACT ULTRASOUND COMPLETE  COMPARISON:  CT scan of June 16, 2014  FINDINGS: Right Kidney:  Length: 10.0 cm. Multiple small simple cysts are noted, with the largest measuring 1 cm in midpole. Echogenicity within normal limits. No mass or hydronephrosis visualized.  Left Kidney:  Length: 10.5 cm. 5.2 cm simple exophytic cyst is seen arising from midpole. Echogenicity within normal limits. No mass or hydronephrosis visualized.  Bladder:  Not well distended.  Ureteral jets are not visualized.  IMPRESSION: Bilateral simple renal cysts. No hydronephrosis or renal obstruction is noted.    Electronically Signed   By: Marijo Conception, M.D.   On: 04/05/2015 09:43     CBC  Recent Labs Lab 04/27/15 2045 04/28/15 0535 04/29/15 0545  WBC 11.3* 10.4 9.1  HGB 13.7 12.1* 11.3*  HCT 42.2 37.6* 35.7*  PLT 243 209 210  MCV 91.3 91.0 92.5  MCH 29.7 29.3 29.3  MCHC 32.5 32.2 31.7  RDW 14.8 14.8 15.0  LYMPHSABS 2.3  --   --   MONOABS 1.3*  --   --   EOSABS 0.3  --   --   BASOSABS 0.1  --   --     Chemistries   Recent Labs Lab 04/27/15 2045 04/28/15 0535 04/29/15 0545  NA 133* 139 139  K 5.4* 4.9 4.5  CL 102 109 110  CO2 21* 23 23  GLUCOSE 114* 112* 132*  BUN 29* 26* 14  CREATININE 2.22* 1.91* 1.66*  CALCIUM 8.8* 8.4* 8.0*  AST 20  --   --   ALT 16*  --   --   ALKPHOS 78  --   --   BILITOT 0.5  --   --    ------------------------------------------------------------------------------------------------------------------ CrCl cannot be calculated (Unknown ideal weight.). ------------------------------------------------------------------------------------------------------------------ No results for input(s): HGBA1C in the last 72 hours. ------------------------------------------------------------------------------------------------------------------ No results for input(s): CHOL, HDL, LDLCALC, TRIG, CHOLHDL, LDLDIRECT in the last 72 hours. ------------------------------------------------------------------------------------------------------------------ No results for input(s): TSH, T4TOTAL, T3FREE, THYROIDAB in the last 72 hours.  Invalid input(s): FREET3 ------------------------------------------------------------------------------------------------------------------ No results for input(s): VITAMINB12, FOLATE, FERRITIN, TIBC, IRON, RETICCTPCT in the last 72 hours.  Coagulation profile No results for input(s): INR, PROTIME in the last 168 hours.  No results for input(s): DDIMER in the last 72 hours.  Cardiac Enzymes No results for input(s): CKMB,  TROPONINI, MYOGLOBIN in the last 168 hours.  Invalid input(s): CK ------------------------------------------------------------------------------------------------------------------ Invalid input(s): POCBNP   CBG: No results for input(s): GLUCAP in the last 168 hours.     Studies: No results found.    No results found for: HGBA1C Lab Results  Component Value Date   CREATININE 1.66* 04/29/2015       Scheduled Meds: . donepezil  10 mg Oral Daily  . DULoxetine  30 mg Oral Daily  . enoxaparin (LOVENOX) injection  30 mg Subcutaneous Daily  . finasteride  5 mg Oral Daily  . fluticasone  2 spray Each Nare Daily  . gabapentin  300 mg Oral TID  . guaiFENesin  600 mg Oral Daily  . loratadine  10 mg Oral Daily  . mometasone-formoterol  2 puff Inhalation BID  . saccharomyces boulardii  250 mg Oral BID  . tiotropium  18 mcg Inhalation Daily  . traMADol-acetaminophen  1 tablet Oral Daily  . vancomycin  125 mg Oral 4 times per day   Continuous Infusions: . sodium chloride 100 mL/hr at 04/29/15 1002    Principal Problem:   ARF (acute renal failure) Active Problems:   BPH (benign prostatic hypertrophy) with urinary retention   Acute kidney injury   Enteritis due to Clostridium difficile   Dehydration   Hyperkalemia   COPD (chronic obstructive pulmonary disease)   CKD (chronic kidney disease) stage 3, GFR 30-59 ml/min    Time spent: 45 minutes   [facility] Pager [phone]. If 7PM-7AM, please contact night-coverage at [email or web address], password [email or web address] 04/29/2015, 3:53 PM  LOS: 1 day

## 2015-04-29 NOTE — Progress Notes (Signed)
Utilization review completed.  

## 2015-04-30 LAB — CBC
HEMATOCRIT: 40.7 % (ref 39.0–52.0)
Hemoglobin: 13.1 g/dL (ref 13.0–17.0)
MCH: 29.4 pg (ref 26.0–34.0)
MCHC: 32.2 g/dL (ref 30.0–36.0)
MCV: 91.3 fL (ref 78.0–100.0)
Platelets: 232 10*3/uL (ref 150–400)
RBC: 4.46 MIL/uL (ref 4.22–5.81)
RDW: 14.9 % (ref 11.5–15.5)
WBC: 8.8 10*3/uL (ref 4.0–10.5)

## 2015-04-30 LAB — COMPREHENSIVE METABOLIC PANEL
ALT: 15 U/L — ABNORMAL LOW (ref 17–63)
ANION GAP: 6 (ref 5–15)
AST: 18 U/L (ref 15–41)
Albumin: 2.8 g/dL — ABNORMAL LOW (ref 3.5–5.0)
Alkaline Phosphatase: 74 U/L (ref 38–126)
BILIRUBIN TOTAL: 0.4 mg/dL (ref 0.3–1.2)
BUN: 9 mg/dL (ref 6–20)
CALCIUM: 8.5 mg/dL — AB (ref 8.9–10.3)
CHLORIDE: 110 mmol/L (ref 101–111)
CO2: 24 mmol/L (ref 22–32)
Creatinine, Ser: 1.5 mg/dL — ABNORMAL HIGH (ref 0.61–1.24)
GFR calc Af Amer: 45 mL/min — ABNORMAL LOW (ref >60)
GFR calc non Af Amer: 39 mL/min — ABNORMAL LOW (ref >60)
Glucose, Bld: 104 mg/dL — ABNORMAL HIGH (ref 65–99)
POTASSIUM: 4.4 mmol/L (ref 3.5–5.1)
Sodium: 140 mmol/L (ref 135–145)
Total Protein: 5.7 g/dL — ABNORMAL LOW (ref 6.5–8.1)

## 2015-04-30 MED ORDER — SACCHAROMYCES BOULARDII 250 MG PO CAPS: 250.0000 mg | ORAL_CAPSULE | Freq: Two times a day (BID) | ORAL | Status: DC

## 2015-04-30 MED ORDER — TRAMADOL-ACETAMINOPHEN 37.5-325 MG PO TABS: 1.0000 | ORAL_TABLET | ORAL | Status: DC

## 2015-04-30 MED ORDER — VANCOMYCIN 50 MG/ML ORAL SOLUTION: ORAL | Status: DC

## 2015-04-30 NOTE — Progress Notes (Signed)
Contacted CSW when discharge order received, she has not been able to confirm which [facility] facility patient is from due to her call not being returned. I contacted both [place] facilities on [address] and the [employer] and determined that patient is from the [place] location.The [facility] facility gave the phone # for the [place] facility as [phone] which matches number listed on patient's facesheet.Called [phone] at 4:35 but no answer and no voicemail option available. Called at 4:50pm spoke with Delcie Roch in nursing, confirmed that the patient is a resident there but they would not be able to take the patient back this evening but can in the am. Will follow up with CSW in the am.

## 2015-04-30 NOTE — Progress Notes (Signed)
Physical Therapy Treatment Patient Details Name: Paul Gray MRN: [id number] DOB: 10-08-1922 Today's Date: 04/30/2015    History of Present Illness      PT Comments    Patient feeling much better today. Bowels are control and patient was able to walk with unit with supervision and 4WW. Recommend daily ambulation with staff. Goals met for acute PT. Will sign off and recommend follow up PT at ALF.   Follow Up Recommendations  Home health PT (at ALF)     Equipment Recommendations  None recommended by PT    Recommendations for Other Services       Precautions / Restrictions Precautions Precautions: Fall    Mobility  Bed Mobility Overal bed mobility: Modified Independent                Transfers Overall transfer level: Modified independent                  Ambulation/Gait Ambulation/Gait assistance: Supervision Ambulation Distance (Feet): 600 Feet Assistive device: 4-wheeled walker Gait Pattern/deviations: Step-through pattern   Gait velocity interpretation: at or above normal speed for age/gender     Stairs            Wheelchair Mobility    Modified Rankin (Stroke Patients Only)       Balance                                    Cognition Arousal/Alertness: Awake/alert Behavior During Therapy: WFL for tasks assessed/performed Overall Cognitive Status: Within Functional Limits for tasks assessed       Memory:  (Hx of dementia. Plesantly confused)              Exercises      General Comments        Pertinent Vitals/Pain      Home Living                      Prior Function            PT Goals (current goals can now be found in the care plan section) Progress towards PT goals: Goals met/education completed, patient discharged from PT    Frequency       PT Plan      Co-evaluation             End of Session   Activity Tolerance: Patient tolerated treatment well Patient left: in  chair;with call bell/phone within reach     Time: 0832-0850 PT Time Calculation (min) (ACUTE ONLY): 18 min  Charges:  $Gait Training: 8-22 mins                    G Codes:      Jacqualyn Posey 04/30/2015, 8:55 AM 04/30/2015 Jacqualyn Posey PTA [phone] pager [phone] office

## 2015-04-30 NOTE — Discharge Summary (Signed)
Physician Discharge Summary  Paul Gray MRN: [id number] DOB/AGE: [birth year] 81 y.o.  PCP: Alonza Bogus, MD   Admit date: 04/27/2015 Discharge date: 04/30/2015  Discharge Diagnoses:   Principal Problem:   ARF (acute renal failure) Active Problems:   BPH (benign prostatic hypertrophy) with urinary retention   Acute kidney injury   Enteritis due to Clostridium difficile   Dehydration   Hyperkalemia   COPD (chronic obstructive pulmonary disease)   CKD (chronic kidney disease) stage 3, GFR 30-59 ml/min    Follow-up recommendations Follow-up with PCP in 3-5 days , including all  additional recommended appointments as below Follow-up CBC, CMP in 3-5 days      Medication List    STOP taking these medications        docusate sodium 100 MG capsule  Commonly known as:  COLACE     loperamide 2 MG tablet  Commonly known as:  IMODIUM A-D      TAKE these medications        ADVAIR DISKUS 250-50 MCG/DOSE Aepb  Generic drug:  Fluticasone-Salmeterol  Inhale 1 puff into the lungs every 12 (twelve) hours. 9 am and 9 pm     albuterol 108 (90 BASE) MCG/ACT inhaler  Commonly known as:  PROVENTIL HFA;VENTOLIN HFA  Inhale 2 puffs into the lungs every 6 (six) hours as needed for wheezing or shortness of breath.     donepezil 10 MG tablet  Commonly known as:  ARICEPT  Take 1 tablet by mouth daily.     DULoxetine 30 MG capsule  Commonly known as:  CYMBALTA  Take 30 mg by mouth daily.     finasteride 5 MG tablet  Commonly known as:  PROSCAR  Take 5 mg by mouth daily.     fluticasone 50 MCG/ACT nasal spray  Commonly known as:  FLONASE  Place 2 sprays into both nostrils daily.     gabapentin 300 MG capsule  Commonly known as:  NEURONTIN  Take 300 mg by mouth 3 (three) times daily.     guaiFENesin 600 MG 12 hr tablet  Commonly known as:  MUCINEX  Take 600 mg by mouth daily.     ketoconazole 2 % shampoo  Commonly known as:  NIZORAL  Apply 1 application topically daily  as needed for irritation.     loratadine 10 MG tablet  Commonly known as:  CLARITIN  Take 10 mg by mouth daily.     saccharomyces boulardii 250 MG capsule  Commonly known as:  FLORASTOR  Take 1 capsule (250 mg total) by mouth 2 (two) times daily.     tiotropium 18 MCG inhalation capsule  Commonly known as:  SPIRIVA  Place 18 mcg into inhaler and inhale every morning.     traMADol-acetaminophen 37.5-325 MG per tablet  Commonly known as:  ULTRACET  Take 1 tablet by mouth See admin instructions. Take 1 tablet every morning and daily as needed for pain.     vancomycin 50 mg/mL oral solution  Commonly known as:  VANCOCIN  125 mg orally four times daily for 7 to 14 days  125 mg orally twice daily for 7 days  125 mg orally once daily for 7 days  125 mg orally every other day for 7 days  125 mg orally every 3 days for 14 days         Discharge Condition:    Disposition: 04-Intermediate Care Facility   Consults:     Significant Diagnostic Studies:  Dg Chest 2  View  04/04/2015   CLINICAL DATA:  Diarrhea since yesterday. Nausea and diarrhea since PICC neck yesterday. History of lung cancer, COPD, former smoker.  EXAM: CHEST  2 VIEW  COMPARISON:  06/08/2014  FINDINGS: Normal heart size and pulmonary vascularity. Segmental elevation of the right hemidiaphragm. No focal airspace disease or consolidation. Probable emphysema. No blunting of costophrenic angles. No pneumothorax. Scarring in the left lung apex may be postoperative or due to radiation therapy. Calcification of the aorta. Degenerative changes in the spine and shoulders. No significant change since prior study.  IMPRESSION: Chronic changes in the chest including emphysematous changes in the lungs and scarring in the left apex. No evidence of active pulmonary disease.   Electronically Signed   By: Lucienne Capers M.D.   On: 04/04/2015 23:55   US Renal  04/05/2015   CLINICAL DATA:  Acute renal failure.  EXAM: RENAL / URINARY  TRACT ULTRASOUND COMPLETE  COMPARISON:  CT scan of June 16, 2014  FINDINGS: Right Kidney:  Length: 10.0 cm. Multiple small simple cysts are noted, with the largest measuring 1 cm in midpole. Echogenicity within normal limits. No mass or hydronephrosis visualized.  Left Kidney:  Length: 10.5 cm. 5.2 cm simple exophytic cyst is seen arising from midpole. Echogenicity within normal limits. No mass or hydronephrosis visualized.  Bladder:  Not well distended.  Ureteral jets are not visualized.  IMPRESSION: Bilateral simple renal cysts. No hydronephrosis or renal obstruction is noted.   Electronically Signed   By: Marijo Conception, M.D.   On: 04/05/2015 09:43      There were no vitals filed for this visit.   Microbiology: Recent Results (from the past 240 hour(s))  C difficile quick scan w PCR reflex     Status: Abnormal   Collection Time: 04/28/15  1:54 AM  Result Value Ref Range Status   C Diff antigen POSITIVE (A) NEGATIVE Corrected    Comment: CORRECTED ON 08/07 AT 0740: PREVIOUSLY REPORTED AS POSITIVE POSITIVE   C Diff toxin POSITIVE (A) NEGATIVE Corrected    Comment: CORRECTED ON 08/07 AT 0740: PREVIOUSLY REPORTED AS POSITIVE POSITIVE   C Diff interpretation Positive for toxigenic C. difficile  Final       Blood Culture    Component Value Date/Time   SDES URINE, RANDOM 04/05/2015 0550   SPECREQUEST NONE 04/05/2015 0550   CULT  04/05/2015 0550    NO GROWTH 2 DAYS Performed at [facility] 04/07/2015 FINAL 04/05/2015 0550      Labs: Results for orders placed or performed during the hospital encounter of 04/27/15 (from the past 48 hour(s))  CBC     Status: Abnormal   Collection Time: 04/29/15  5:45 AM  Result Value Ref Range   WBC 9.1 4.0 - 10.5 K/uL   RBC 3.86 (L) 4.22 - 5.81 MIL/uL   Hemoglobin 11.3 (L) 13.0 - 17.0 g/dL   HCT 35.7 (L) 39.0 - 52.0 %   MCV 92.5 78.0 - 100.0 fL   MCH 29.3 26.0 - 34.0 pg   MCHC 31.7 30.0 - 36.0 g/dL   RDW 15.0 11.5  - 15.5 %   Platelets 210 150 - 400 K/uL  Basic metabolic panel     Status: Abnormal   Collection Time: 04/29/15  5:45 AM  Result Value Ref Range   Sodium 139 135 - 145 mmol/L   Potassium 4.5 3.5 - 5.1 mmol/L   Chloride 110 101 - 111 mmol/L   CO2 23  22 - 32 mmol/L   Glucose, Bld 132 (H) 65 - 99 mg/dL   BUN 14 6 - 20 mg/dL   Creatinine, Ser 1.66 (H) 0.61 - 1.24 mg/dL   Calcium 8.0 (L) 8.9 - 10.3 mg/dL   GFR calc non Af Amer 35 (L) >60 mL/min   GFR calc Af Amer 40 (L) >60 mL/min    Comment: (NOTE) The eGFR has been calculated using the CKD EPI equation. This calculation has not been validated in all clinical situations. eGFR's persistently <60 mL/min signify possible Chronic Kidney Disease.    Anion gap 6 5 - 15  Comprehensive metabolic panel     Status: Abnormal   Collection Time: 04/30/15  7:57 AM  Result Value Ref Range   Sodium 140 135 - 145 mmol/L   Potassium 4.4 3.5 - 5.1 mmol/L   Chloride 110 101 - 111 mmol/L   CO2 24 22 - 32 mmol/L   Glucose, Bld 104 (H) 65 - 99 mg/dL   BUN 9 6 - 20 mg/dL   Creatinine, Ser 1.50 (H) 0.61 - 1.24 mg/dL   Calcium 8.5 (L) 8.9 - 10.3 mg/dL   Total Protein 5.7 (L) 6.5 - 8.1 g/dL   Albumin 2.8 (L) 3.5 - 5.0 g/dL   AST 18 15 - 41 U/L   ALT 15 (L) 17 - 63 U/L   Alkaline Phosphatase 74 38 - 126 U/L   Total Bilirubin 0.4 0.3 - 1.2 mg/dL   GFR calc non Af Amer 39 (L) >60 mL/min   GFR calc Af Amer 45 (L) >60 mL/min    Comment: (NOTE) The eGFR has been calculated using the CKD EPI equation. This calculation has not been validated in all clinical situations. eGFR's persistently <60 mL/min signify possible Chronic Kidney Disease.    Anion gap 6 5 - 15  CBC     Status: None   Collection Time: 04/30/15  7:57 AM  Result Value Ref Range   WBC 8.8 4.0 - 10.5 K/uL   RBC 4.46 4.22 - 5.81 MIL/uL   Hemoglobin 13.1 13.0 - 17.0 g/dL   HCT 40.7 39.0 - 52.0 %   MCV 91.3 78.0 - 100.0 fL   MCH 29.4 26.0 - 34.0 pg   MCHC 32.2 30.0 - 36.0 g/dL   RDW 14.9  11.5 - 15.5 %   Platelets 232 150 - 400 K/uL     Lipid Panel  No results found for: CHOL, TRIG, HDL, CHOLHDL, VLDL, LDLCALC, LDLDIRECT   No results found for: HGBA1C   Lab Results  Component Value Date   CREATININE 1.50* 04/30/2015     HPI :*Gerson Fauth Nanna is a 81 y.o. male With COPD CKD 3 who recently was admitted and discharged on 04/07/15 for C diff colitis. He was discharged on oral Flagyl. He is very hard of hearing and also a poor historian. He has dementia. His son is answering most of the questions. He lives in assisted living. He returns for recurrence of diarrhea- his son thinks he has had 5 episodes during the day and 2 int the ER. He had a fever of 101 today. His diarrhea probably started a few days ago. A med list from the facility reveals he received Lomotil on Thursday and Friday. He has had no appetite today and has barely eaten.   HOSPITAL COURSE:  Acute kidney injury- CKD , baseline creatinine around 1.6, creatinine back to baseline, after IV fluids hydration ,   Dehydration -probably due to diarrhea  and poor PO intake.  Continue IV fluids  Hyperkalemia Likely from acute kidney injury, resolved  Diarrhea/ mild leukocytosis - recent C diff colitis treated with Flagyl recently  - Advance to soft diet --continue by mouth Vancomycin  Taper and Florastor for C. difficile colitis  COPD - cont inhalers  Dementia - cont Aricept  BPH - Proscar   Discharge Exam:  Blood pressure 145/71, pulse 95, temperature 97.9 F (36.6 C), temperature source Oral, resp. rate 18, SpO2 97 %. General: elderly man laying in bed, appears quite comfortable, very hard of hearing HEENT: Normocephalic and Atraumatic, Mucous membranes pink  PERRLA; EOM intact; No scleral icterus,   Nares: Patent, Oropharynx: Clear, Fair Dentition   Neck: FROM, no cervical lymphadenopathy, thyromegaly, carotid bruit or JVD;  Breasts: deferred CHEST  WALL: No tenderness  CHEST: Normal respiration, clear to auscultation bilaterally  HEART: Regular rate and rhythm; no murmurs rubs or gallops  BACK: No kyphosis or scoliosis; no CVA tenderness  GI: Positive Bowel Sounds, soft, non-tender; no masses, no organomegaly Rectal Exam: deferred MSK: No cyanosis, clubbing, or edema Genitalia: not examined  SKIN: no rash or ulceration  CNS: Alert and Oriented x 4, Nonfocal exam, CN 2-12 intact         Discharge Instructions    Diet - low sodium heart healthy    Complete by:  As directed      Increase activity slowly    Complete by:  As directed              Signed: Drago Hammonds 04/30/2015, 4:15 PM        Time spent >45 mins

## 2015-05-01 MED ORDER — VANCOMYCIN 50 MG/ML ORAL SOLUTION: ORAL | Status: DC

## 2015-05-01 NOTE — Discharge Planning (Addendum)
Patient will discharge today per MD order. Patient will discharge to (return) [facility]  RN to call report prior to transportation to: [phone] Transportation: family to transport  [place] sent discharge summary to SNF for review.  Packet is complete.  RN, patient and family aware of discharge plans.  Nonnie Done, [place] [phone]  Psychiatric & Orthopedics (5N 1-16) Clinical Social Worker

## 2015-05-01 NOTE — Discharge Summary (Signed)
Physician Discharge Summary  Paul Gray MRN: [id number] DOB/AGE: [birth year] 81 y.o.  PCP: Alonza Bogus, MD   Admit date: 04/27/2015 Discharge date: 05/01/2015  Discharge Diagnoses:   Principal Problem:   ARF (acute renal failure) Active Problems:   BPH (benign prostatic hypertrophy) with urinary retention   Acute kidney injury   Enteritis due to Clostridium difficile   Dehydration   Hyperkalemia   COPD (chronic obstructive pulmonary disease)   CKD (chronic kidney disease) stage 3, GFR 30-59 ml/min    Follow-up recommendations Follow-up with PCP in 3-5 days , including all  additional recommended appointments as below Follow-up CBC, CMP in 3-5 days      Medication List    STOP taking these medications        docusate sodium 100 MG capsule  Commonly known as:  COLACE     loperamide 2 MG tablet  Commonly known as:  IMODIUM A-D      TAKE these medications        ADVAIR DISKUS 250-50 MCG/DOSE Aepb  Generic drug:  Fluticasone-Salmeterol  Inhale 1 puff into the lungs every 12 (twelve) hours. 9 am and 9 pm     albuterol 108 (90 BASE) MCG/ACT inhaler  Commonly known as:  PROVENTIL HFA;VENTOLIN HFA  Inhale 2 puffs into the lungs every 6 (six) hours as needed for wheezing or shortness of breath.     donepezil 10 MG tablet  Commonly known as:  ARICEPT  Take 1 tablet by mouth daily.     DULoxetine 30 MG capsule  Commonly known as:  CYMBALTA  Take 30 mg by mouth daily.     finasteride 5 MG tablet  Commonly known as:  PROSCAR  Take 5 mg by mouth daily.     fluticasone 50 MCG/ACT nasal spray  Commonly known as:  FLONASE  Place 2 sprays into both nostrils daily.     gabapentin 300 MG capsule  Commonly known as:  NEURONTIN  Take 300 mg by mouth 3 (three) times daily.     guaiFENesin 600 MG 12 hr tablet  Commonly known as:  MUCINEX  Take 600 mg by mouth daily.     ketoconazole 2 % shampoo  Commonly known as:  NIZORAL  Apply 1 application topically daily  as needed for irritation.     loratadine 10 MG tablet  Commonly known as:  CLARITIN  Take 10 mg by mouth daily.     saccharomyces boulardii 250 MG capsule  Commonly known as:  FLORASTOR  Take 1 capsule (250 mg total) by mouth 2 (two) times daily.     tiotropium 18 MCG inhalation capsule  Commonly known as:  SPIRIVA  Place 18 mcg into inhaler and inhale every morning.     traMADol-acetaminophen 37.5-325 MG per tablet  Commonly known as:  ULTRACET  Take 1 tablet by mouth See admin instructions. Take 1 tablet every morning and daily as needed for pain.     vancomycin 50 mg/mL oral solution  Commonly known as:  VANCOCIN  125 mg orally four times daily for 7 to 14 days  125 mg orally twice daily for 7 days  125 mg orally once daily for 7 days  125 mg orally every other day for 7 days  125 mg orally every 3 days for 14 days         Discharge Condition:    Disposition: 04-Intermediate Care Facility   Consults:     Significant Diagnostic Studies:  Dg Chest 2  View  04/04/2015   CLINICAL DATA:  Diarrhea since yesterday. Nausea and diarrhea since PICC neck yesterday. History of lung cancer, COPD, former smoker.  EXAM: CHEST  2 VIEW  COMPARISON:  06/08/2014  FINDINGS: Normal heart size and pulmonary vascularity. Segmental elevation of the right hemidiaphragm. No focal airspace disease or consolidation. Probable emphysema. No blunting of costophrenic angles. No pneumothorax. Scarring in the left lung apex may be postoperative or due to radiation therapy. Calcification of the aorta. Degenerative changes in the spine and shoulders. No significant change since prior study.  IMPRESSION: Chronic changes in the chest including emphysematous changes in the lungs and scarring in the left apex. No evidence of active pulmonary disease.   Electronically Signed   By: Lucienne Capers M.D.   On: 04/04/2015 23:55   US Renal  04/05/2015   CLINICAL DATA:  Acute renal failure.  EXAM: RENAL / URINARY  TRACT ULTRASOUND COMPLETE  COMPARISON:  CT scan of June 16, 2014  FINDINGS: Right Kidney:  Length: 10.0 cm. Multiple small simple cysts are noted, with the largest measuring 1 cm in midpole. Echogenicity within normal limits. No mass or hydronephrosis visualized.  Left Kidney:  Length: 10.5 cm. 5.2 cm simple exophytic cyst is seen arising from midpole. Echogenicity within normal limits. No mass or hydronephrosis visualized.  Bladder:  Not well distended.  Ureteral jets are not visualized.  IMPRESSION: Bilateral simple renal cysts. No hydronephrosis or renal obstruction is noted.   Electronically Signed   By: Marijo Conception, M.D.   On: 04/05/2015 09:43      There were no vitals filed for this visit.   Microbiology: Recent Results (from the past 240 hour(s))  C difficile quick scan w PCR reflex     Status: Abnormal   Collection Time: 04/28/15  1:54 AM  Result Value Ref Range Status   C Diff antigen POSITIVE (A) NEGATIVE Corrected    Comment: CORRECTED ON 08/07 AT 0740: PREVIOUSLY REPORTED AS POSITIVE POSITIVE   C Diff toxin POSITIVE (A) NEGATIVE Corrected    Comment: CORRECTED ON 08/07 AT 0740: PREVIOUSLY REPORTED AS POSITIVE POSITIVE   C Diff interpretation Positive for toxigenic C. difficile  Final       Blood Culture    Component Value Date/Time   SDES URINE, RANDOM 04/05/2015 0550   SPECREQUEST NONE 04/05/2015 0550   CULT  04/05/2015 0550    NO GROWTH 2 DAYS Performed at [facility] 04/07/2015 FINAL 04/05/2015 0550      Labs: Results for orders placed or performed during the hospital encounter of 04/27/15 (from the past 48 hour(s))  Comprehensive metabolic panel     Status: Abnormal   Collection Time: 04/30/15  7:57 AM  Result Value Ref Range   Sodium 140 135 - 145 mmol/L   Potassium 4.4 3.5 - 5.1 mmol/L   Chloride 110 101 - 111 mmol/L   CO2 24 22 - 32 mmol/L   Glucose, Bld 104 (H) 65 - 99 mg/dL   BUN 9 6 - 20 mg/dL   Creatinine, Ser 1.50  (H) 0.61 - 1.24 mg/dL   Calcium 8.5 (L) 8.9 - 10.3 mg/dL   Total Protein 5.7 (L) 6.5 - 8.1 g/dL   Albumin 2.8 (L) 3.5 - 5.0 g/dL   AST 18 15 - 41 U/L   ALT 15 (L) 17 - 63 U/L   Alkaline Phosphatase 74 38 - 126 U/L   Total Bilirubin 0.4 0.3 - 1.2 mg/dL  GFR calc non Af Amer 39 (L) >60 mL/min   GFR calc Af Amer 45 (L) >60 mL/min    Comment: (NOTE) The eGFR has been calculated using the CKD EPI equation. This calculation has not been validated in all clinical situations. eGFR's persistently <60 mL/min signify possible Chronic Kidney Disease.    Anion gap 6 5 - 15  CBC     Status: None   Collection Time: 04/30/15  7:57 AM  Result Value Ref Range   WBC 8.8 4.0 - 10.5 K/uL   RBC 4.46 4.22 - 5.81 MIL/uL   Hemoglobin 13.1 13.0 - 17.0 g/dL   HCT 40.7 39.0 - 52.0 %   MCV 91.3 78.0 - 100.0 fL   MCH 29.4 26.0 - 34.0 pg   MCHC 32.2 30.0 - 36.0 g/dL   RDW 14.9 11.5 - 15.5 %   Platelets 232 150 - 400 K/uL     Lipid Panel  No results found for: CHOL, TRIG, HDL, CHOLHDL, VLDL, LDLCALC, LDLDIRECT   No results found for: HGBA1C   Lab Results  Component Value Date   CREATININE 1.50* 04/30/2015     HPI :*Paul Gray is a 81 y.o. male With COPD CKD 3 who recently was admitted and discharged on 04/07/15 for C diff colitis. He was discharged on oral Flagyl. He is very hard of hearing and also a poor historian. He has dementia. His son is answering most of the questions. He lives in assisted living. He returns for recurrence of diarrhea- his son thinks he has had 5 episodes during the day and 2 int the ER. He had a fever of 101 today. His diarrhea probably started a few days ago. A med list from the facility reveals he received Lomotil on Thursday and Friday. He has had no appetite today and has barely eaten.   HOSPITAL COURSE:  Acute kidney injury- CKD , baseline creatinine around 1.6, creatinine back to baseline, after IV fluids hydration ,   Dehydration -probably due to diarrhea and  poor PO intake.  Continue IV fluids  Hyperkalemia Likely from acute kidney injury, resolved  Diarrhea/ mild leukocytosis - recent C diff colitis treated with Flagyl recently  - Advance to soft diet --continue by mouth Vancomycin  Taper and Florastor for C. difficile colitis  COPD - cont inhalers  Dementia - cont Aricept  BPH - Proscar   Discharge Exam:  Blood pressure 151/62, pulse 74, temperature 98.6 F (37 C), temperature source Oral, resp. rate 18, SpO2 97 %. General: elderly man laying in bed, appears quite comfortable, very hard of hearing HEENT: Normocephalic and Atraumatic, Mucous membranes pink  PERRLA; EOM intact; No scleral icterus,   Nares: Patent, Oropharynx: Clear, Fair Dentition   Neck: FROM, no cervical lymphadenopathy, thyromegaly, carotid bruit or JVD;  Breasts: deferred CHEST WALL: No tenderness  CHEST: Normal respiration, clear to auscultation bilaterally  HEART: Regular rate and rhythm; no murmurs rubs or gallops  BACK: No kyphosis or scoliosis; no CVA tenderness  GI: Positive Bowel Sounds, soft, non-tender; no masses, no organomegaly Rectal Exam: deferred MSK: No cyanosis, clubbing, or edema Genitalia: not examined  SKIN: no rash or ulceration  CNS: Alert and Oriented x 4, Nonfocal exam, CN 2-12 intact     Discharge Instructions    Diet - low sodium heart healthy    Complete by:  As directed      Diet - low sodium heart healthy    Complete by:  As directed  Increase activity slowly    Complete by:  As directed      Increase activity slowly    Complete by:  As directed              Signed: Yena Tisby 05/01/2015, 11:10 AM        Time spent >45 mins

## 2015-05-31 ENCOUNTER — Ambulatory Visit (INDEPENDENT_AMBULATORY_CARE_PROVIDER_SITE_OTHER): Payer: Medicare Other | Admitting: Urology

## 2015-05-31 DIAGNOSIS — N138 Other obstructive and reflux uropathy: Secondary | ICD-10-CM

## 2015-05-31 DIAGNOSIS — R339 Retention of urine, unspecified: Secondary | ICD-10-CM | POA: Diagnosis not present

## 2015-05-31 DIAGNOSIS — N401 Enlarged prostate with lower urinary tract symptoms: Secondary | ICD-10-CM

## 2015-09-02 IMAGING — US US RENAL
1 series · 14 of 25 positions shown · non-contrast
Comparison: CT scan of June 16, 2014

CLINICAL DATA: Acute renal failure.

EXAM:
RENAL / URINARY TRACT ULTRASOUND COMPLETE

[Series 1: us renal · 0.21mm/px · 14 of 61 slices shown]
[im 1/61]
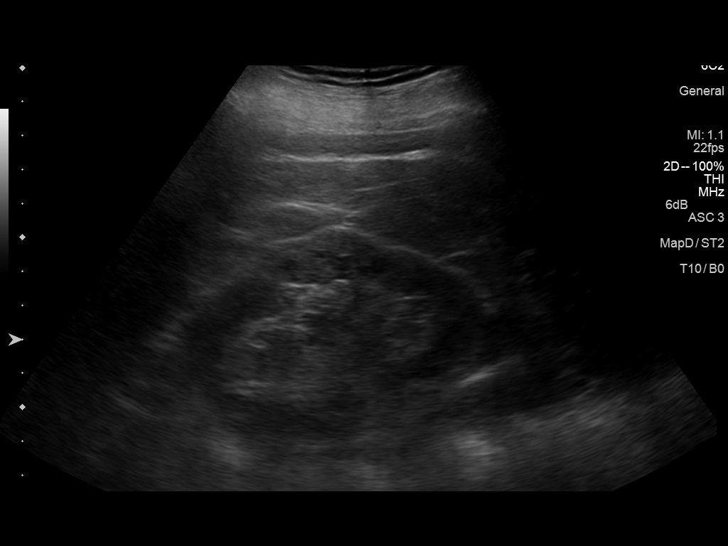
[im 6/61]
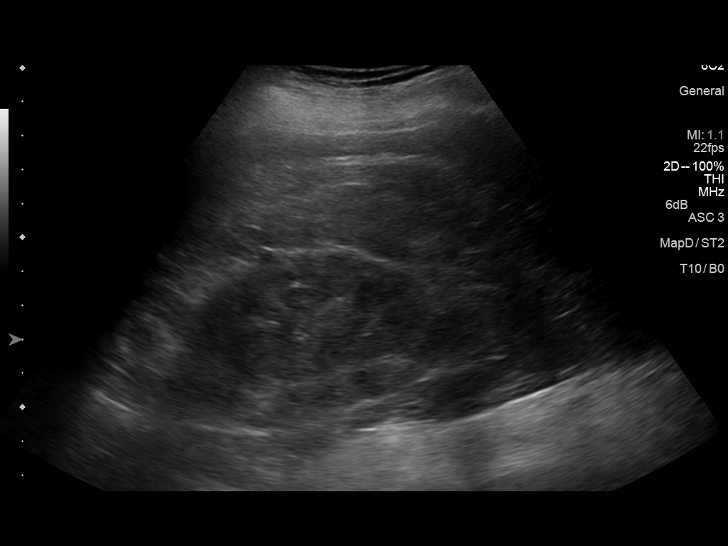
[im 11/61]
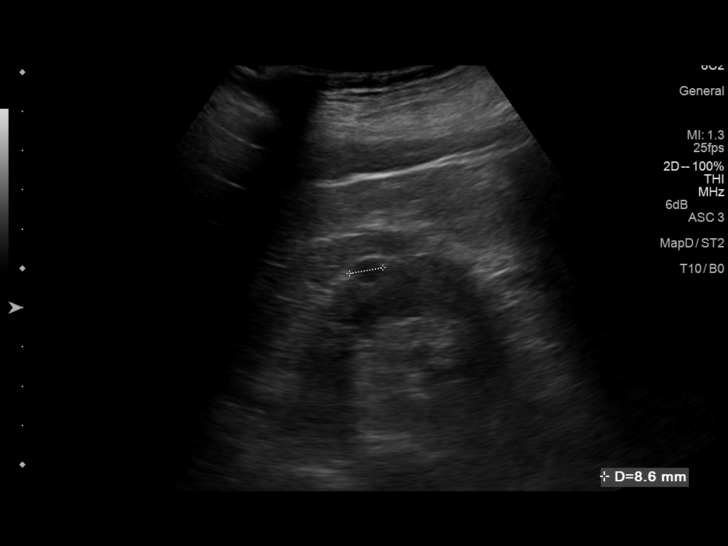
[im 16/61]
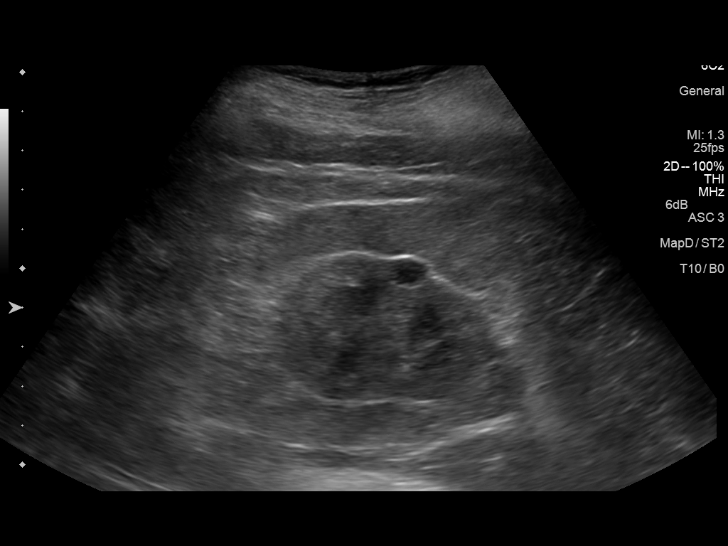
[im 21/61]
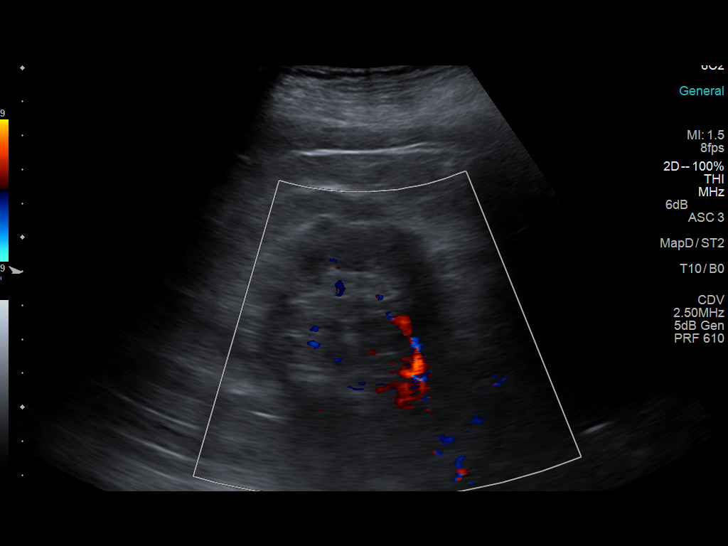
[im 23/61]
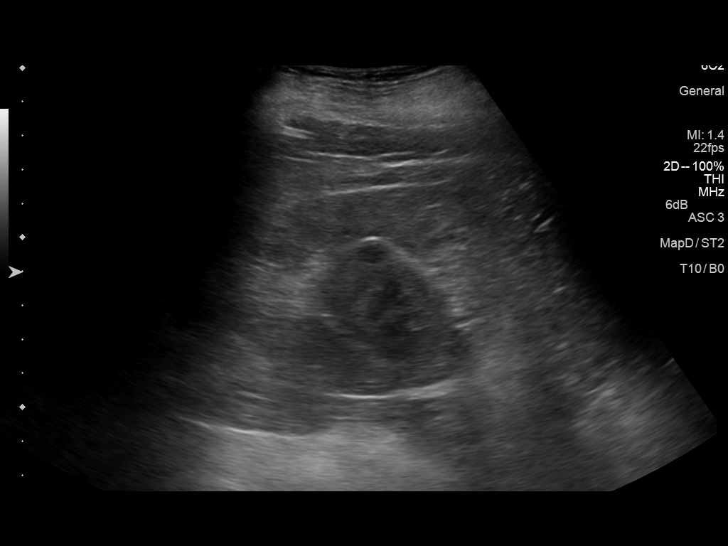
[im 28/61]
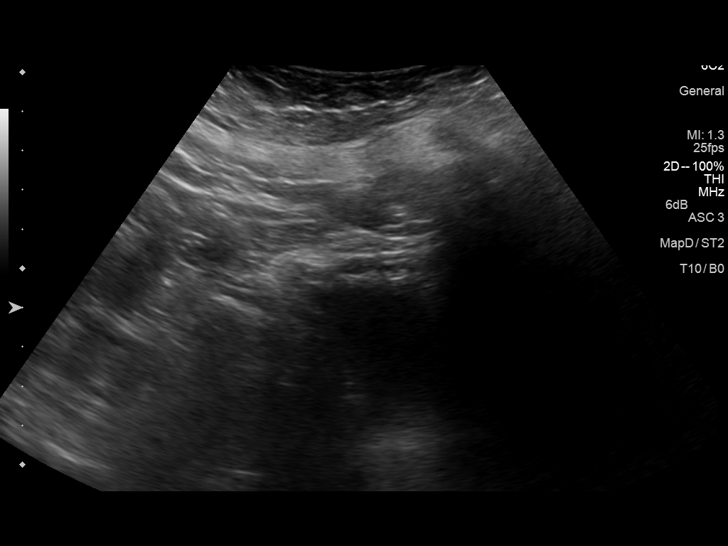
[im 33/61]
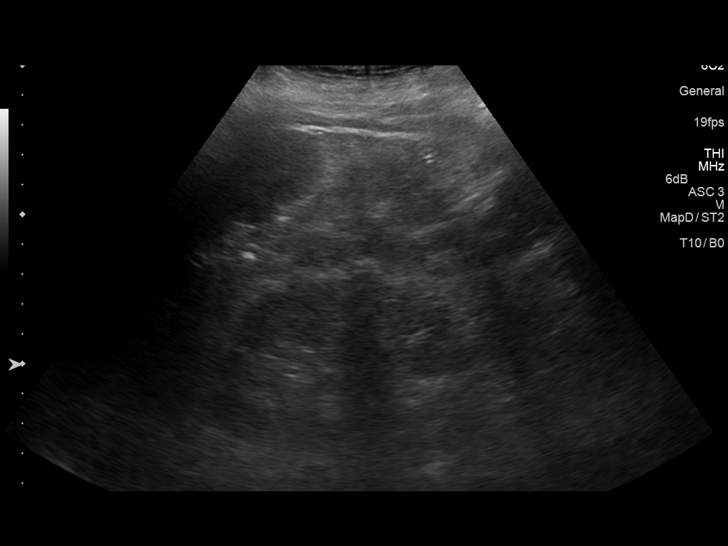
[im 38/61]
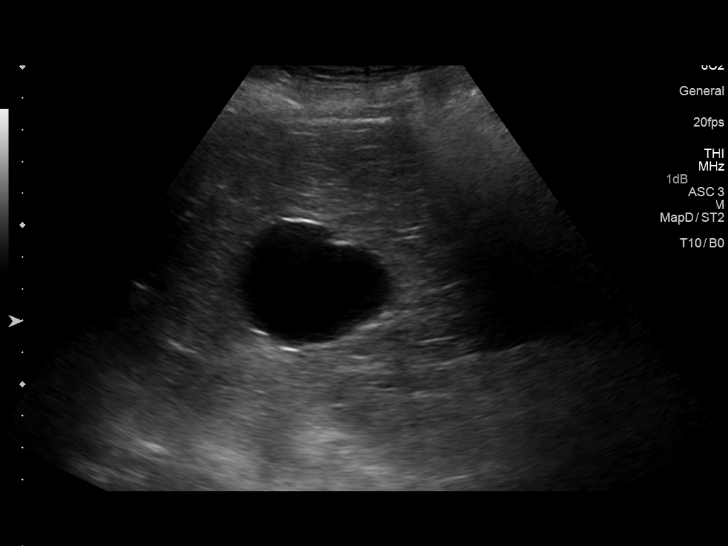
[im 41/61]
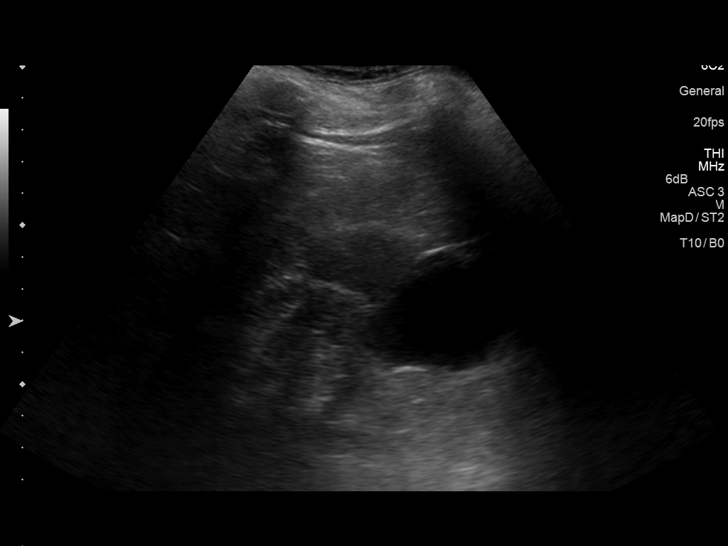
[im 46/61]
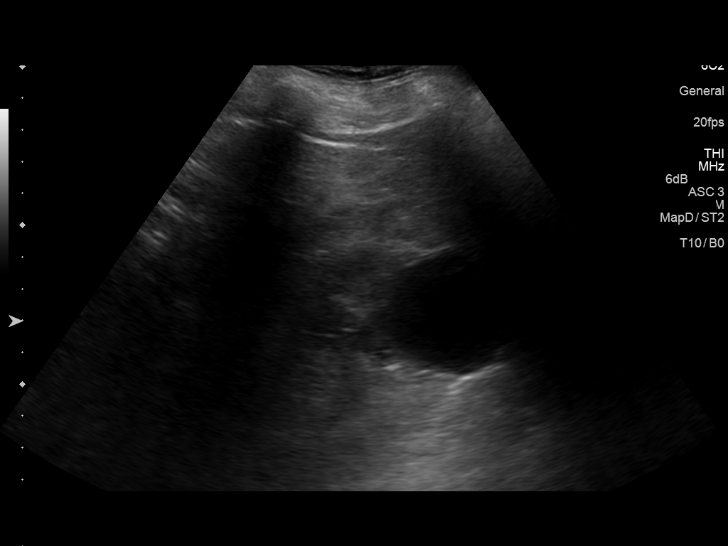
[im 51/61]
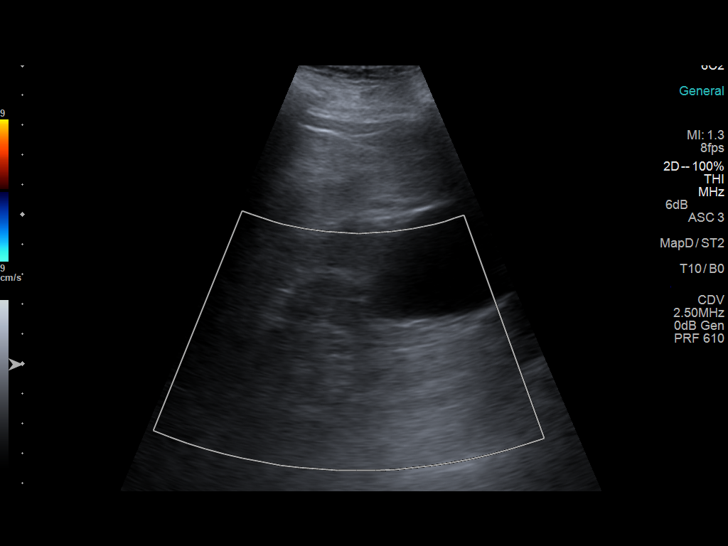
[im 56/61]
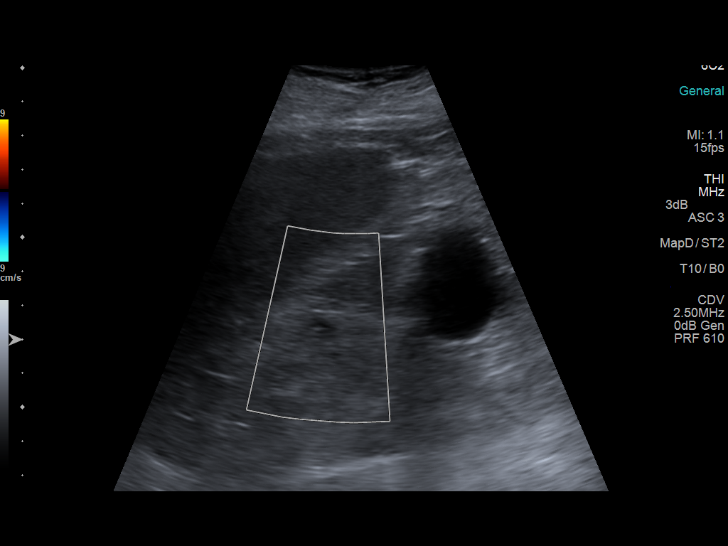
[im 61/61]
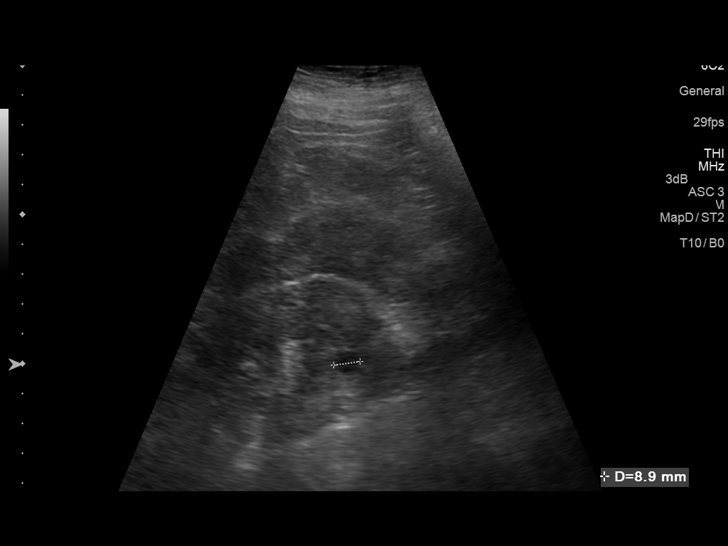

[14 of 25 positions shown; findings below may reference images not displayed]

FINDINGS: Right Kidney:

Length: 10.0 cm. Multiple small simple cysts are noted, with the
largest measuring 1 cm in midpole. Echogenicity within normal
limits. No mass or hydronephrosis visualized.

Left Kidney:

Length: 10.5 cm. 5.2 cm simple exophytic cyst is seen arising from
midpole. Echogenicity within normal limits. No mass or
hydronephrosis visualized.

Bladder:

Not well distended.  Ureteral jets are not visualized.
IMPRESSION: Bilateral simple renal cysts. No hydronephrosis or renal obstruction
is noted.

## 2016-06-21 IMAGING — DX DG CHEST 2V
2 series · 2 of 2 positions shown · non-contrast
Comparison: 06/08/2014

CLINICAL DATA: Diarrhea since yesterday. Nausea and diarrhea since
PICC neck yesterday. History of lung cancer, COPD, former smoker.

EXAM:
CHEST  2 VIEW

[chest lat]
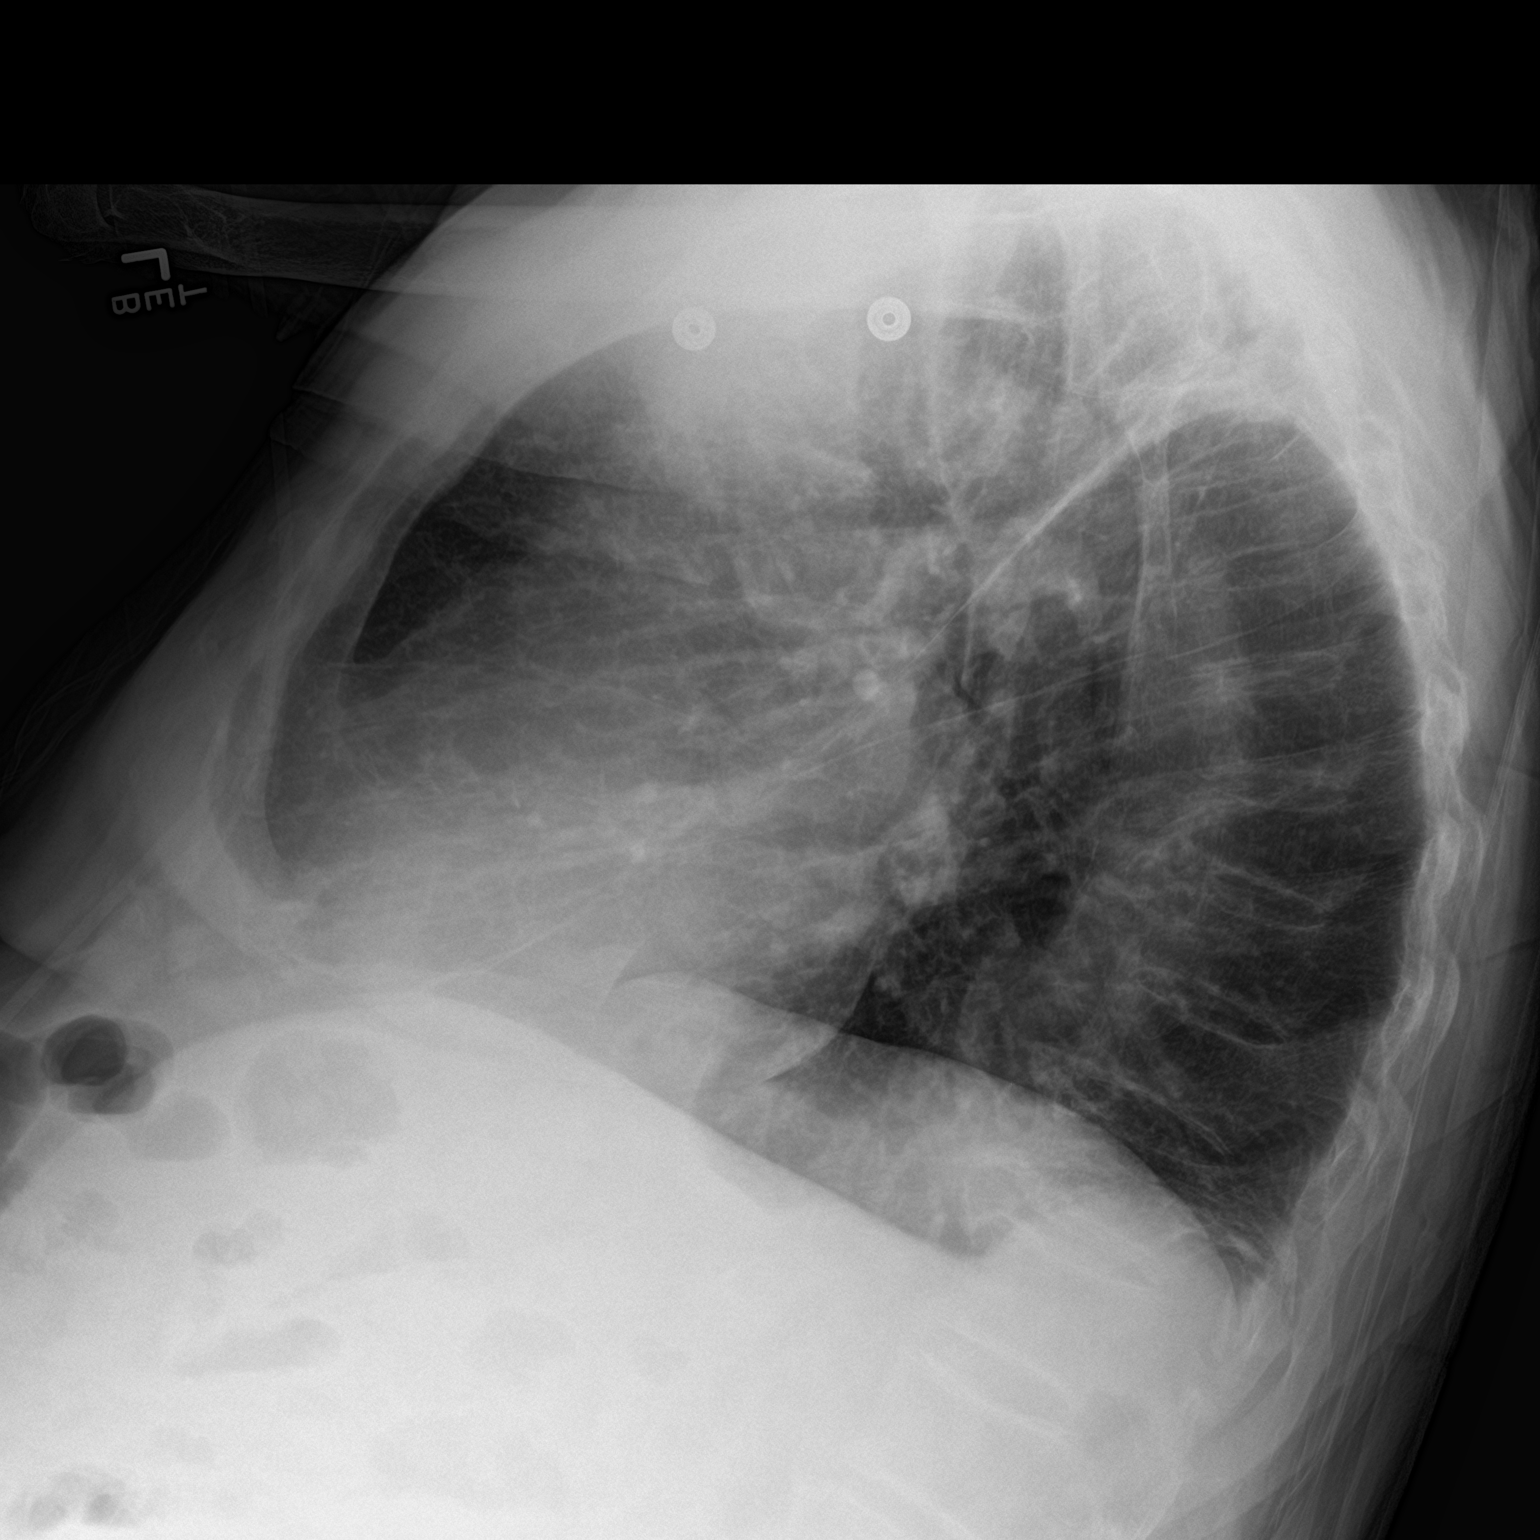

[chest ap strecther]
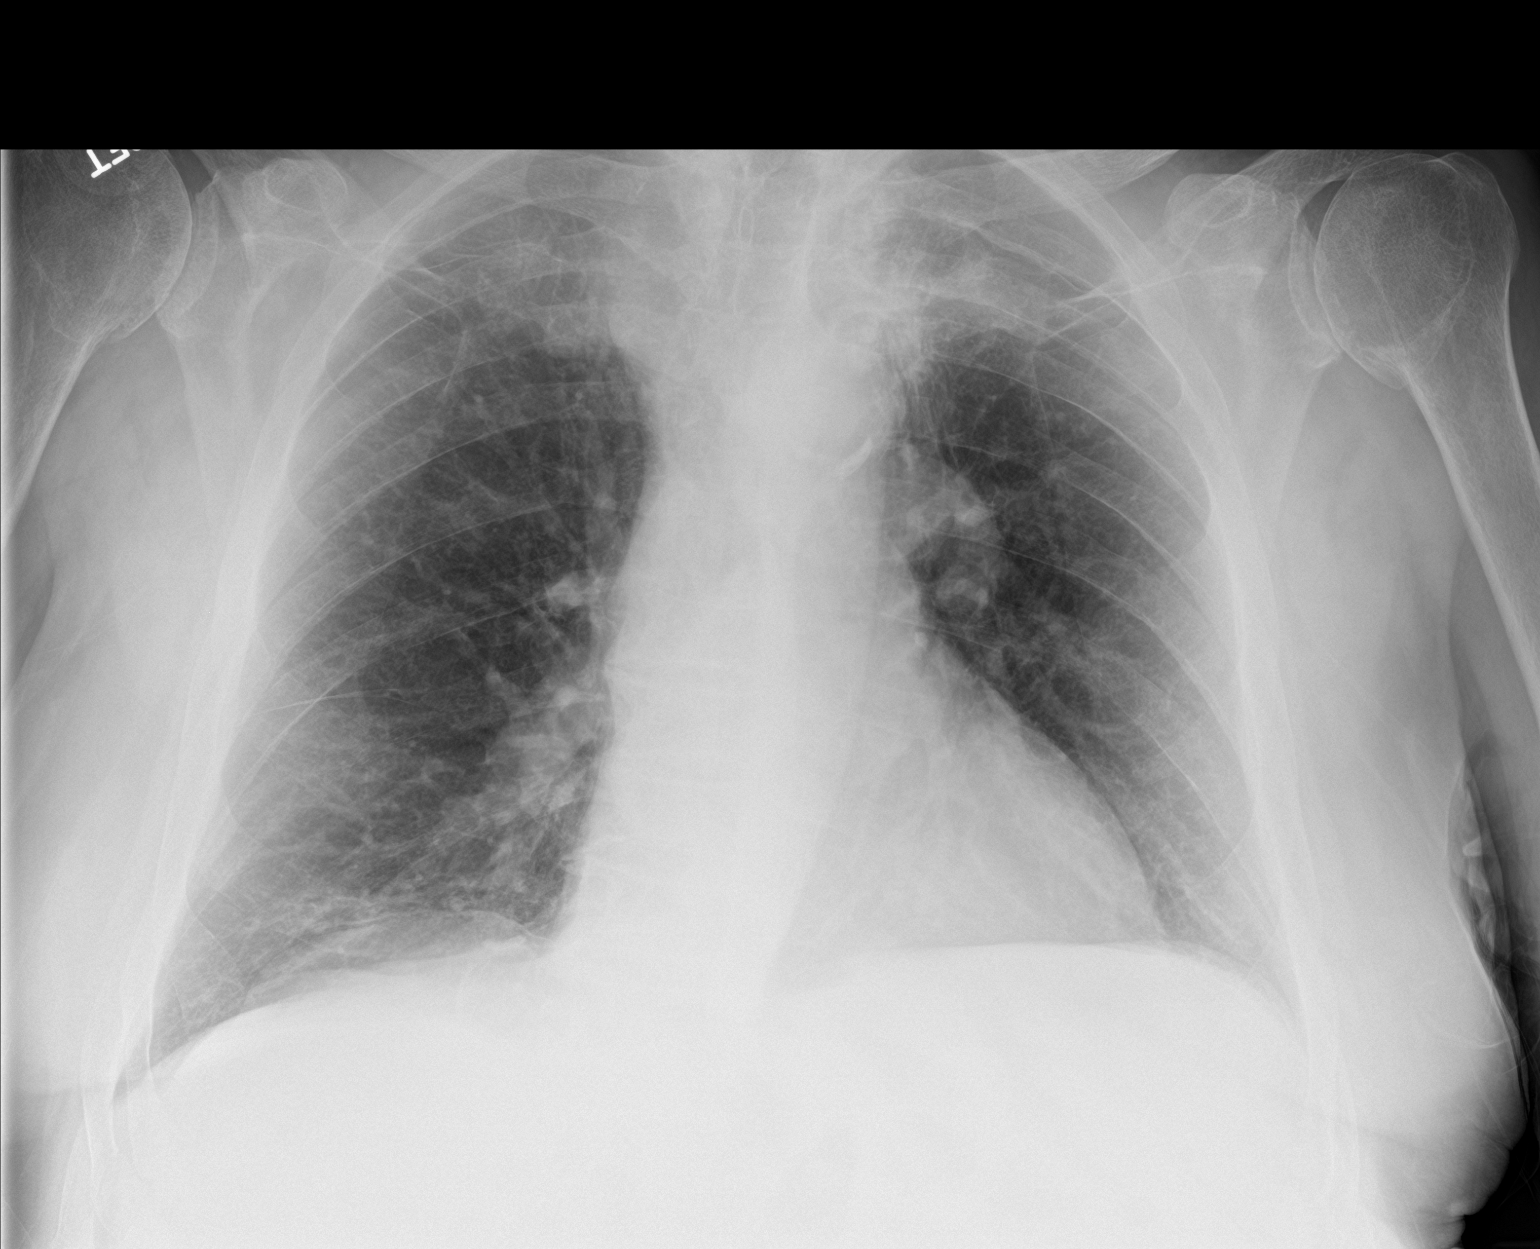

[2 of 2 positions shown; findings below may reference images not displayed]

FINDINGS: Normal heart size and pulmonary vascularity. Segmental elevation of
the right hemidiaphragm. No focal airspace disease or consolidation.
Probable emphysema. No blunting of costophrenic angles. No
pneumothorax. Scarring in the left lung apex may be postoperative or
due to radiation therapy. Calcification of the aorta. Degenerative
changes in the spine and shoulders. No significant change since
prior study.
IMPRESSION: Chronic changes in the chest including emphysematous changes in the
lungs and scarring in the left apex. No evidence of active pulmonary
disease.

## 2017-02-19 ENCOUNTER — Ambulatory Visit (INDEPENDENT_AMBULATORY_CARE_PROVIDER_SITE_OTHER): Payer: Medicare Other | Admitting: Urology

## 2017-02-19 DIAGNOSIS — N3020 Other chronic cystitis without hematuria: Secondary | ICD-10-CM | POA: Diagnosis not present

## 2017-02-19 DIAGNOSIS — N401 Enlarged prostate with lower urinary tract symptoms: Secondary | ICD-10-CM | POA: Diagnosis not present

## 2017-10-15 ENCOUNTER — Emergency Department (HOSPITAL_COMMUNITY): Payer: Medicare Other

## 2017-10-15 ENCOUNTER — Other Ambulatory Visit: Payer: Self-pay

## 2017-10-15 ENCOUNTER — Encounter (HOSPITAL_COMMUNITY): Payer: Self-pay

## 2017-10-15 ENCOUNTER — Inpatient Hospital Stay (HOSPITAL_COMMUNITY)
Admission: EM | Admit: 2017-10-15 | Discharge: 2017-10-22 | DRG: 181 | Disposition: A | Discharge status: Nursing Home (Includes ICF) | Payer: Medicare Other | Attending: Pulmonary Disease | Admitting: Pulmonary Disease

## 2017-10-15 DIAGNOSIS — Z85118 Personal history of other malignant neoplasm of bronchus and lung: Secondary | ICD-10-CM | POA: Diagnosis not present

## 2017-10-15 DIAGNOSIS — N400 Enlarged prostate without lower urinary tract symptoms: Secondary | ICD-10-CM | POA: Diagnosis present

## 2017-10-15 DIAGNOSIS — Z86010 Personal history of colonic polyps: Secondary | ICD-10-CM | POA: Diagnosis not present

## 2017-10-15 DIAGNOSIS — F0390 Unspecified dementia without behavioral disturbance: Secondary | ICD-10-CM | POA: Diagnosis present

## 2017-10-15 DIAGNOSIS — R918 Other nonspecific abnormal finding of lung field: Secondary | ICD-10-CM | POA: Insufficient documentation

## 2017-10-15 DIAGNOSIS — J984 Other disorders of lung: Secondary | ICD-10-CM | POA: Diagnosis not present

## 2017-10-15 DIAGNOSIS — G8929 Other chronic pain: Secondary | ICD-10-CM | POA: Diagnosis present

## 2017-10-15 DIAGNOSIS — Z79891 Long term (current) use of opiate analgesic: Secondary | ICD-10-CM

## 2017-10-15 DIAGNOSIS — N179 Acute kidney failure, unspecified: Secondary | ICD-10-CM | POA: Diagnosis present

## 2017-10-15 DIAGNOSIS — Z9049 Acquired absence of other specified parts of digestive tract: Secondary | ICD-10-CM

## 2017-10-15 DIAGNOSIS — Z961 Presence of intraocular lens: Secondary | ICD-10-CM | POA: Diagnosis present

## 2017-10-15 DIAGNOSIS — Z7951 Long term (current) use of inhaled steroids: Secondary | ICD-10-CM

## 2017-10-15 DIAGNOSIS — Z7189 Other specified counseling: Secondary | ICD-10-CM | POA: Diagnosis not present

## 2017-10-15 DIAGNOSIS — Z79899 Other long term (current) drug therapy: Secondary | ICD-10-CM

## 2017-10-15 DIAGNOSIS — Z87891 Personal history of nicotine dependence: Secondary | ICD-10-CM

## 2017-10-15 DIAGNOSIS — C3412 Malignant neoplasm of upper lobe, left bronchus or lung: Principal | ICD-10-CM | POA: Diagnosis present

## 2017-10-15 DIAGNOSIS — H9190 Unspecified hearing loss, unspecified ear: Secondary | ICD-10-CM | POA: Diagnosis present

## 2017-10-15 DIAGNOSIS — Z8249 Family history of ischemic heart disease and other diseases of the circulatory system: Secondary | ICD-10-CM | POA: Diagnosis not present

## 2017-10-15 DIAGNOSIS — Z880 Allergy status to penicillin: Secondary | ICD-10-CM | POA: Diagnosis not present

## 2017-10-15 DIAGNOSIS — Z9079 Acquired absence of other genital organ(s): Secondary | ICD-10-CM | POA: Diagnosis not present

## 2017-10-15 DIAGNOSIS — E875 Hyperkalemia: Secondary | ICD-10-CM | POA: Diagnosis present

## 2017-10-15 DIAGNOSIS — Z515 Encounter for palliative care: Secondary | ICD-10-CM | POA: Diagnosis not present

## 2017-10-15 DIAGNOSIS — Z66 Do not resuscitate: Secondary | ICD-10-CM | POA: Diagnosis present

## 2017-10-15 DIAGNOSIS — J441 Chronic obstructive pulmonary disease with (acute) exacerbation: Secondary | ICD-10-CM | POA: Diagnosis present

## 2017-10-15 DIAGNOSIS — N184 Chronic kidney disease, stage 4 (severe): Secondary | ICD-10-CM | POA: Diagnosis present

## 2017-10-15 LAB — COMPREHENSIVE METABOLIC PANEL
ALT: 22 U/L (ref 17–63)
AST: 18 U/L (ref 15–41)
Albumin: 3.2 g/dL — ABNORMAL LOW (ref 3.5–5.0)
Alkaline Phosphatase: 77 U/L (ref 38–126)
Anion gap: 9 (ref 5–15)
BUN: 52 mg/dL — AB (ref 6–20)
CHLORIDE: 106 mmol/L (ref 101–111)
CO2: 26 mmol/L (ref 22–32)
CREATININE: 2.16 mg/dL — AB (ref 0.61–1.24)
Calcium: 9.5 mg/dL (ref 8.9–10.3)
GFR calc Af Amer: 29 mL/min — ABNORMAL LOW (ref >60)
GFR calc non Af Amer: 25 mL/min — ABNORMAL LOW (ref >60)
GLUCOSE: 114 mg/dL — AB (ref 65–99)
POTASSIUM: 5.4 mmol/L — AB (ref 3.5–5.1)
SODIUM: 141 mmol/L (ref 135–145)
Total Bilirubin: 0.4 mg/dL (ref 0.3–1.2)
Total Protein: 6.3 g/dL — ABNORMAL LOW (ref 6.5–8.1)

## 2017-10-15 LAB — URINALYSIS, ROUTINE W REFLEX MICROSCOPIC
BILIRUBIN URINE: NEGATIVE
Glucose, UA: NEGATIVE mg/dL
Hgb urine dipstick: NEGATIVE
Ketones, ur: NEGATIVE mg/dL
LEUKOCYTES UA: NEGATIVE
NITRITE: NEGATIVE
PH: 5 (ref 5.0–8.0)
Protein, ur: NEGATIVE mg/dL
Specific Gravity, Urine: 1.016 (ref 1.005–1.030)

## 2017-10-15 LAB — LIPASE, BLOOD: LIPASE: 32 U/L (ref 11–51)

## 2017-10-15 LAB — CBC WITH DIFFERENTIAL/PLATELET
BASOS ABS: 0 10*3/uL (ref 0.0–0.1)
Basophils Relative: 0 %
EOS PCT: 1 %
Eosinophils Absolute: 0.1 10*3/uL (ref 0.0–0.7)
HCT: 36.9 % — ABNORMAL LOW (ref 39.0–52.0)
Hemoglobin: 11 g/dL — ABNORMAL LOW (ref 13.0–17.0)
LYMPHS PCT: 40 %
Lymphs Abs: 6.2 10*3/uL — ABNORMAL HIGH (ref 0.7–4.0)
MCH: 27 pg (ref 26.0–34.0)
MCHC: 29.8 g/dL — ABNORMAL LOW (ref 30.0–36.0)
MCV: 90.7 fL (ref 78.0–100.0)
Monocytes Absolute: 0.6 10*3/uL (ref 0.1–1.0)
Monocytes Relative: 4 %
Neutro Abs: 8.4 10*3/uL — ABNORMAL HIGH (ref 1.7–7.7)
Neutrophils Relative %: 55 %
Platelets: 240 10*3/uL (ref 150–400)
RBC: 4.07 MIL/uL — ABNORMAL LOW (ref 4.22–5.81)
RDW: 15.3 % (ref 11.5–15.5)
WBC: 15.3 10*3/uL — ABNORMAL HIGH (ref 4.0–10.5)

## 2017-10-15 LAB — POC OCCULT BLOOD, ED: FECAL OCCULT BLD: NEGATIVE

## 2017-10-15 LAB — BRAIN NATRIURETIC PEPTIDE: B Natriuretic Peptide: 70 pg/mL (ref 0.0–100.0)

## 2017-10-15 LAB — TROPONIN I: Troponin I: <0.03 ng/mL (ref <0.03)

## 2017-10-15 MED ORDER — SODIUM POLYSTYRENE SULFONATE 15 GM/60ML PO SUSP
30.0000 g | Freq: Once | ORAL | Status: AC
  Administered 2017-10-15: 30 g via ORAL
  Filled 2017-10-15: qty 120

## 2017-10-15 MED ORDER — ALBUTEROL SULFATE (2.5 MG/3ML) 0.083% IN NEBU: 2.5000 mg | INHALATION_SOLUTION | RESPIRATORY_TRACT | Status: DC | PRN

## 2017-10-15 MED ORDER — DONEPEZIL HCL 5 MG PO TABS
10.0000 mg | ORAL_TABLET | Freq: Every day | ORAL | Status: DC
  Administered 2017-10-15 – 2017-10-21 (×7): 10 mg via ORAL
  Filled 2017-10-15 (×7): qty 2

## 2017-10-15 MED ORDER — ACETAMINOPHEN 325 MG PO TABS
650.0000 mg | ORAL_TABLET | Freq: Four times a day (QID) | ORAL | Status: DC | PRN
  Administered 2017-10-22: 650 mg via ORAL
  Filled 2017-10-15: qty 2

## 2017-10-15 MED ORDER — ONDANSETRON HCL 4 MG/2ML IJ SOLN: 4.0000 mg | Freq: Four times a day (QID) | INTRAMUSCULAR | Status: DC | PRN

## 2017-10-15 MED ORDER — HYDROCODONE-ACETAMINOPHEN 5-325 MG PO TABS
1.0000 | ORAL_TABLET | Freq: Four times a day (QID) | ORAL | Status: DC | PRN
  Administered 2017-10-15 – 2017-10-16 (×2): 1 via ORAL
  Administered 2017-10-17 – 2017-10-18 (×2): 2 via ORAL
  Administered 2017-10-18: 1 via ORAL
  Administered 2017-10-19 – 2017-10-20 (×3): 2 via ORAL
  Filled 2017-10-15: qty 1
  Filled 2017-10-15 (×4): qty 2
  Filled 2017-10-15 (×2): qty 1
  Filled 2017-10-15: qty 2

## 2017-10-15 MED ORDER — SENNOSIDES-DOCUSATE SODIUM 8.6-50 MG PO TABS: 1.0000 | ORAL_TABLET | Freq: Every evening | ORAL | Status: DC | PRN

## 2017-10-15 MED ORDER — ACETAMINOPHEN 650 MG RE SUPP: 650.0000 mg | Freq: Four times a day (QID) | RECTAL | Status: DC | PRN

## 2017-10-15 MED ORDER — GABAPENTIN 300 MG PO CAPS
300.0000 mg | ORAL_CAPSULE | Freq: Every day | ORAL | Status: DC
  Administered 2017-10-16 – 2017-10-22 (×7): 300 mg via ORAL
  Filled 2017-10-15 (×7): qty 1

## 2017-10-15 MED ORDER — SODIUM CHLORIDE 0.9 % IV BOLUS (SEPSIS)
500.0000 mL | Freq: Once | INTRAVENOUS | Status: AC
  Administered 2017-10-15: 500 mL via INTRAVENOUS

## 2017-10-15 MED ORDER — GUAIFENESIN 100 MG/5ML PO SOLN
5.0000 mL | Freq: Four times a day (QID) | ORAL | Status: DC | PRN
  Administered 2017-10-16 – 2017-10-21 (×6): 100 mg via ORAL
  Filled 2017-10-15 (×6): qty 5

## 2017-10-15 MED ORDER — SODIUM CHLORIDE 0.9% FLUSH
3.0000 mL | Freq: Two times a day (BID) | INTRAVENOUS | Status: DC
  Administered 2017-10-16 – 2017-10-22 (×10): 3 mL via INTRAVENOUS

## 2017-10-15 MED ORDER — ONDANSETRON HCL 4 MG PO TABS: 4.0000 mg | ORAL_TABLET | Freq: Four times a day (QID) | ORAL | Status: DC | PRN

## 2017-10-15 MED ORDER — METHYLPREDNISOLONE SODIUM SUCC 125 MG IJ SOLR
125.0000 mg | Freq: Once | INTRAMUSCULAR | Status: AC
  Administered 2017-10-15: 125 mg via INTRAVENOUS
  Filled 2017-10-15: qty 2

## 2017-10-15 MED ORDER — BISACODYL 5 MG PO TBEC: 5.0000 mg | DELAYED_RELEASE_TABLET | Freq: Every day | ORAL | Status: DC | PRN

## 2017-10-15 MED ORDER — METHYLPREDNISOLONE SODIUM SUCC 40 MG IJ SOLR
40.0000 mg | Freq: Three times a day (TID) | INTRAMUSCULAR | Status: DC
  Administered 2017-10-16 – 2017-10-22 (×20): 40 mg via INTRAVENOUS
  Filled 2017-10-15 (×20): qty 1

## 2017-10-15 MED ORDER — HEPARIN SODIUM (PORCINE) 5000 UNIT/ML IJ SOLN
5000.0000 [IU] | Freq: Three times a day (TID) | INTRAMUSCULAR | Status: DC
  Administered 2017-10-15 – 2017-10-22 (×20): 5000 [IU] via SUBCUTANEOUS
  Filled 2017-10-15 (×20): qty 1

## 2017-10-15 MED ORDER — SODIUM CHLORIDE 0.9 % IV SOLN
INTRAVENOUS | Status: AC
  Administered 2017-10-15 – 2017-10-16 (×2): via INTRAVENOUS

## 2017-10-15 MED ORDER — ALBUTEROL SULFATE (2.5 MG/3ML) 0.083% IN NEBU
5.0000 mg | INHALATION_SOLUTION | Freq: Once | RESPIRATORY_TRACT | Status: AC
Start: 2017-10-15 — End: 2017-10-15
  Administered 2017-10-15: 5 mg via RESPIRATORY_TRACT
  Filled 2017-10-15: qty 6

## 2017-10-15 MED ORDER — LORATADINE 10 MG PO TABS
10.0000 mg | ORAL_TABLET | Freq: Every day | ORAL | Status: DC
  Administered 2017-10-16 – 2017-10-22 (×7): 10 mg via ORAL
  Filled 2017-10-15 (×7): qty 1

## 2017-10-15 MED ORDER — DULOXETINE HCL 30 MG PO CPEP
30.0000 mg | ORAL_CAPSULE | Freq: Every day | ORAL | Status: DC
  Administered 2017-10-16 – 2017-10-22 (×7): 30 mg via ORAL
  Filled 2017-10-15 (×7): qty 1

## 2017-10-15 MED ORDER — FLUTICASONE PROPIONATE 50 MCG/ACT NA SUSP
2.0000 | Freq: Every day | NASAL | Status: DC
  Administered 2017-10-16 – 2017-10-22 (×7): 2 via NASAL
  Filled 2017-10-15: qty 16

## 2017-10-15 MED ORDER — FLUTICASONE FUROATE-VILANTEROL 100-25 MCG/INH IN AEPB
1.0000 | INHALATION_SPRAY | Freq: Every day | RESPIRATORY_TRACT | Status: DC
  Administered 2017-10-16 – 2017-10-22 (×7): 1 via RESPIRATORY_TRACT
  Filled 2017-10-15: qty 28

## 2017-10-15 NOTE — ED Provider Notes (Addendum)
[facility] EMERGENCY DEPARTMENT Provider Note   CSN: [id number] Arrival date & time: 10/15/17  1600     History   Chief Complaint Chief Complaint  Patient presents with  . Emesis    HPI Paul Gray is a 84 y.o. male. Level  5 caveat secondary to dementia HPI 84 year old man history of CKD, COPD, dementia, lung cancer presents today from assisted living care facility with reports that he has been coughing up productive sputum for several days with dark color noted today.  History is obtained from EMS.  They report that the patient's physician wanted him transported for further evaluation.  Patient is hard of hearing and is unable to give any further history. Past Medical History:  Diagnosis Date  . Back pain   . BPH (benign prostatic hyperplasia)   . C. difficile diarrhea   . CKD (chronic kidney disease) stage 4, GFR 15-29 ml/min (HCC)    Creatinine 2.0  . COPD (chronic obstructive pulmonary disease) ([place])   . Hx of adenomatous colonic polyps   . Lung cancer ([place])   . S/P colonoscopy 2002, 2007   Hyperplastic polyps 2002; pancolonic diverticula 2007  . Shortness of breath     Patient Active Problem List   Diagnosis Date Noted  . ARF (acute renal failure) ([place]) 04/28/2015  . Hyperkalemia 04/28/2015  . COPD (chronic obstructive pulmonary disease) ([place]) 04/28/2015  . CKD (chronic kidney disease) stage 3, GFR 30-59 ml/min (HCC) 04/28/2015  . BPH (benign prostatic hyperplasia) 04/07/2015  . Dehydration 04/07/2015  . Enteritis due to Clostridium difficile 04/06/2015  . Hyperglycemia 04/05/2015  . Diarrhea   . Acute kidney injury ([place]) 04/04/2015  . BPH (benign prostatic hypertrophy) with urinary retention 07/19/2014  . Bladder stones 07/19/2014  . Inguinal hernia without mention of obstruction or gangrene, unilateral or unspecified, (not specified as recurrent) 06/08/2014  . Balance problem 12/12/2013  . Abnormality of gait 12/12/2013  . Precordial pain 08/12/2012  .  H/O: lung cancer 10/14/2011    Past Surgical History:  Procedure Laterality Date  . APPENDECTOMY    . CHOLECYSTECTOMY    . EYE SURGERY     cataracts removed with lens implant  . HERNIA REPAIR  06/08/2014   WITH MESH  . Hernia surgery X 3     Inguinal hernia repair  . INGUINAL HERNIA REPAIR Right 06/08/2014   Procedure: RIGHT INGUINAL HERNIA REPAIR WITH MESH;  Surgeon: Jackolyn Confer, MD;  Location: [place];  Service: General;  Laterality: Right;  . INSERTION OF MESH Right 06/08/2014   Procedure: INSERTION OF MESH;  Surgeon: Jackolyn Confer, MD;  Location: [place];  Service: General;  Laterality: Right;  . Radiofrequency ablation for lung cancer    . TRANSURETHRAL RESECTION OF PROSTATE N/A 07/19/2014   Procedure: TRANSURETHRAL RESECTION OF THE PROSTATE WITH GYRUS INSTRUMENTS;  Surgeon: Malka So, MD;  Location: [facility];  Service: Urology;  Laterality: N/A;       Home Medications    Prior to Admission medications   Medication Sig Start Date End Date Taking? Authorizing Provider  albuterol (PROVENTIL HFA;VENTOLIN HFA) 108 (90 BASE) MCG/ACT inhaler Inhale 2 puffs into the lungs every 6 (six) hours as needed for wheezing or shortness of breath.    [provider]  donepezil (ARICEPT) 10 MG tablet Take 1 tablet by mouth daily. 03/07/15   [provider]  DULoxetine (CYMBALTA) 30 MG capsule Take 30 mg by mouth daily. 07/17/14   [provider]  finasteride (PROSCAR)  5 MG tablet Take 5 mg by mouth daily.    [provider]  fluticasone (FLONASE) 50 MCG/ACT nasal spray Place 2 sprays into both nostrils daily.    [provider]  Fluticasone-Salmeterol (ADVAIR DISKUS) 250-50 MCG/DOSE AEPB Inhale 1 puff into the lungs every 12 (twelve) hours. 9 am and 9 pm    [provider]  gabapentin (NEURONTIN) 300 MG capsule Take 300 mg by mouth 3 (three) times daily.    [provider]  guaiFENesin (MUCINEX) 600 MG 12 hr tablet Take 600 mg by  mouth daily.    [provider]  ketoconazole (NIZORAL) 2 % shampoo Apply 1 application topically daily as needed for irritation.    [provider]  loratadine (CLARITIN) 10 MG tablet Take 10 mg by mouth daily.    [provider]  saccharomyces boulardii (FLORASTOR) 250 MG capsule Take 1 capsule (250 mg total) by mouth 2 (two) times daily. 04/30/15   Reyne Dumas, MD  tiotropium (SPIRIVA) 18 MCG inhalation capsule Place 18 mcg into inhaler and inhale every morning.     [provider]  traMADol-acetaminophen (ULTRACET) 37.5-325 MG per tablet Take 1 tablet by mouth See admin instructions. Take 1 tablet every morning and daily as needed for pain. 04/30/15   Reyne Dumas, MD  vancomycin (VANCOCIN) 50 mg/mL oral solution 125 mg orally four times daily for 7 days, then 125 mg orally twice daily for 7 days, then 125 mg orally once daily for 7 days, then 125 mg orally every other day for 7 days, then  125 mg orally every 3 days for 14 days and stop 05/01/15   Gardiner Barefoot, NP    Family History Family History  Problem Relation Age of Onset  . Heart failure Father        Died in his 43s  . CAD Brother        Not premature    Social History Social History   Tobacco Use  . Smoking status: Former Smoker    Packs/day: 1.00    Years: 60.00    Pack years: 60.00    Types: Cigarettes    Last attempt to quit: 09/22/2003    Years since quitting: 14.0  . Smokeless tobacco: Never Used  . Tobacco comment: quit 6 years ago, about 1 ppd X 60+ years  Substance Use Topics  . Alcohol use: No  . Drug use: No     Allergies   Penicillins   Review of Systems Review of Systems  Unable to perform ROS: Dementia     Physical Exam Updated Vital Signs Ht 1.778 m (5\' 10" )   Wt 79.4 kg (175 lb)   BMI 25.11 kg/m   Physical Exam  Constitutional: He appears well-developed and well-nourished.  HENT:  Head: Normocephalic and atraumatic.  Right Ear: External  ear normal.  Left Ear: External ear normal.  Eyes: EOM are normal. Pupils are equal, round, and reactive to light.  Neck: Normal range of motion. Neck supple.  Cardiovascular: Normal rate and regular rhythm.  Pulmonary/Chest: Effort normal. He has wheezes.  Abdominal: Soft.  Epigastric ttp  Musculoskeletal: Normal range of motion.  Neurological: He is alert. He displays normal reflexes. No cranial nerve deficit. Coordination normal.  Skin: Skin is warm and dry. Capillary refill takes less than 2 seconds.  Nursing note and vitals reviewed.    ED Treatments / Results  Labs (all labs ordered are listed, but only abnormal results are displayed) Labs Reviewed -  No data to display  EKG  EKG Interpretation  Date/Time:  Friday October 15 2017 16:09:36 EST Ventricular Rate:  85 PR Interval:    QRS Duration: 90 QT Interval:  352 QTC Calculation: 419 R Axis:   -32 Text Interpretation:  Sinus arrhythmia Left axis deviation Anterior infarct, old Baseline wander in lead(s) V1 V4 Confirmed by Pattricia Boss [phone]) on 10/15/2017 6:08:44 PM       Radiology Ct Abdomen Pelvis Wo Contrast  Result Date: 10/15/2017 CLINICAL DATA:  Patient with vomiting.  Bilateral shoulder pain. EXAM: CT CHEST, ABDOMEN AND PELVIS WITHOUT CONTRAST TECHNIQUE: Multidetector CT imaging of the chest, abdomen and pelvis was performed following the standard protocol without IV contrast. COMPARISON:  CT abdomen pelvis 06/16/2014; the PET-CT 01/01/2006. FINDINGS: CT CHEST FINDINGS Cardiovascular: Normal heart size. No pericardial effusion. Thoracic aortic vascular calcifications. Mediastinum/Nodes: Enlarged left axillary lymph node measuring 1.4 cm (image 23; series 2). Enlarged right paratracheal lymph node measuring 1.3 cm (image 27; series 2). Additional prominent and mildly enlarged mediastinal lymph nodes are demonstrated particularly within a right paratracheal location. Findings suggestive of left hilar adenopathy  although measurement is difficult given lack of IV contrast. The esophagus is normal in appearance. Lungs/Pleura: Dependent mucus within the trachea. Subpleural scarring/atelectasis within the right lower lobe. 8 mm subpleural nodule right lower lobe (image 100; series 3), increased from prior were it measured 5 mm. Centrilobular and paraseptal emphysematous changes. Within the right upper lobe there is a 3.1 x 2.1 cm mass (image 51; series 3). Within the medial left upper lobe there is a 6.2 x 3.7 cm irregular mass with central cavitation (image 51; series 3). This extends inferiorly into the left suprahilar location. Multiple small nodules within the peripheral left upper lobe (image 63; series 3). New 6 mm left lower lobe nodule (image 94; series 3). Small amount of left apical pleural gas (image 45; series 3). Mucous plugging within the lower lobe bronchi bilaterally. Musculoskeletal: Thoracic spine degenerative changes. Healing fractures involving the posterior left third and fourth ribs. There is associated sclerosis at these locations. There is lytic destruction involving the left aspect of the L2 vertebral body (image 35; series 3), from the adjacent left upper lobe mass. CT ABDOMEN PELVIS FINDINGS Hepatobiliary: Liver is normal in size and contour. Gallbladder is surgically absent. Pancreas: Unremarkable Spleen: Unremarkable Adrenals/Urinary Tract: Adrenal glands are normal. Kidneys are symmetric in size. There is a 4 cm exophytic cystic lesion off the interpolar region of the left kidney. Urinary bladder is unremarkable. Too small to characterize subcentimeter hyperdense lesion interpolar region right kidney (image 68; series 2). Stomach/Bowel: Stool throughout the colon. No abnormal bowel wall thickening or evidence for bowel obstruction. Normal morphology of the stomach. Descending duodenum diverticulum. Vascular/Lymphatic: Normal caliber abdominal aorta. Peripheral calcified atherosclerotic plaque. No  retroperitoneal lymphadenopathy. Reproductive: Prostate is enlarged. Central dystrophic calcifications. Other: None. Musculoskeletal: Osseous demineralization. Stable heterogeneity of the sacral ala bilaterally. Lumbar spine degenerative changes. IMPRESSION: 1. Large irregular mass within the paramediastinal left upper lobe with central cavitation. The mass appears to invade the adjacent thoracic vertebral body and ribs. Findings are concerning for pulmonary malignancy. Given the central cavitation, mycobacterial infectious process is not entirely excluded. 2. Right upper lobe mass concerning for additional pulmonary primary or metastatic lesion. 3. Scattered pulmonary nodularity which may represent metastatic disease. Peripheral left upper lobe nodularity may be infectious/inflammatory in etiology. 4. Left axillary and mediastinal adenopathy concerning for metastatic adenopathy. 5. These results were called by telephone at the  time of interpretation on 10/15/2017 at 6:56 pm to Dr. Pattricia Boss , who verbally acknowledged these results. Electronically Signed   By: Lovey Newcomer M.D.   On: 10/15/2017 19:02   Ct Chest Wo Contrast  Result Date: 10/15/2017 CLINICAL DATA:  Patient with vomiting.  Bilateral shoulder pain. EXAM: CT CHEST, ABDOMEN AND PELVIS WITHOUT CONTRAST TECHNIQUE: Multidetector CT imaging of the chest, abdomen and pelvis was performed following the standard protocol without IV contrast. COMPARISON:  CT abdomen pelvis 06/16/2014; the PET-CT 01/01/2006. FINDINGS: CT CHEST FINDINGS Cardiovascular: Normal heart size. No pericardial effusion. Thoracic aortic vascular calcifications. Mediastinum/Nodes: Enlarged left axillary lymph node measuring 1.4 cm (image 23; series 2). Enlarged right paratracheal lymph node measuring 1.3 cm (image 27; series 2). Additional prominent and mildly enlarged mediastinal lymph nodes are demonstrated particularly within a right paratracheal location. Findings suggestive of  left hilar adenopathy although measurement is difficult given lack of IV contrast. The esophagus is normal in appearance. Lungs/Pleura: Dependent mucus within the trachea. Subpleural scarring/atelectasis within the right lower lobe. 8 mm subpleural nodule right lower lobe (image 100; series 3), increased from prior were it measured 5 mm. Centrilobular and paraseptal emphysematous changes. Within the right upper lobe there is a 3.1 x 2.1 cm mass (image 51; series 3). Within the medial left upper lobe there is a 6.2 x 3.7 cm irregular mass with central cavitation (image 51; series 3). This extends inferiorly into the left suprahilar location. Multiple small nodules within the peripheral left upper lobe (image 63; series 3). New 6 mm left lower lobe nodule (image 94; series 3). Small amount of left apical pleural gas (image 45; series 3). Mucous plugging within the lower lobe bronchi bilaterally. Musculoskeletal: Thoracic spine degenerative changes. Healing fractures involving the posterior left third and fourth ribs. There is associated sclerosis at these locations. There is lytic destruction involving the left aspect of the L2 vertebral body (image 35; series 3), from the adjacent left upper lobe mass. CT ABDOMEN PELVIS FINDINGS Hepatobiliary: Liver is normal in size and contour. Gallbladder is surgically absent. Pancreas: Unremarkable Spleen: Unremarkable Adrenals/Urinary Tract: Adrenal glands are normal. Kidneys are symmetric in size. There is a 4 cm exophytic cystic lesion off the interpolar region of the left kidney. Urinary bladder is unremarkable. Too small to characterize subcentimeter hyperdense lesion interpolar region right kidney (image 68; series 2). Stomach/Bowel: Stool throughout the colon. No abnormal bowel wall thickening or evidence for bowel obstruction. Normal morphology of the stomach. Descending duodenum diverticulum. Vascular/Lymphatic: Normal caliber abdominal aorta. Peripheral calcified  atherosclerotic plaque. No retroperitoneal lymphadenopathy. Reproductive: Prostate is enlarged. Central dystrophic calcifications. Other: None. Musculoskeletal: Osseous demineralization. Stable heterogeneity of the sacral ala bilaterally. Lumbar spine degenerative changes. IMPRESSION: 1. Large irregular mass within the paramediastinal left upper lobe with central cavitation. The mass appears to invade the adjacent thoracic vertebral body and ribs. Findings are concerning for pulmonary malignancy. Given the central cavitation, mycobacterial infectious process is not entirely excluded. 2. Right upper lobe mass concerning for additional pulmonary primary or metastatic lesion. 3. Scattered pulmonary nodularity which may represent metastatic disease. Peripheral left upper lobe nodularity may be infectious/inflammatory in etiology. 4. Left axillary and mediastinal adenopathy concerning for metastatic adenopathy. 5. These results were called by telephone at the time of interpretation on 10/15/2017 at 6:56 pm to Dr. Pattricia Boss , who verbally acknowledged these results. Electronically Signed   By: Lovey Newcomer M.D.   On: 10/15/2017 19:02   Dg Chest Port 1 View  Result Date:  10/15/2017 CLINICAL DATA:  Cough EXAM: PORTABLE CHEST 1 VIEW COMPARISON:  04/04/2015 FINDINGS: Hypoventilation with mild bibasilar atelectasis. No definite pneumonia. Negative for heart failure or effusion. Atherosclerotic aorta IMPRESSION: Mild bibasilar atelectasis. Electronically Signed   By: Franchot Gallo M.D.   On: 10/15/2017 16:43    Procedures Procedures (including critical care time)  Medications Ordered in ED Medications  sodium chloride 0.9 % bolus 500 mL (not administered)  albuterol (PROVENTIL) (2.5 MG/3ML) 0.083% nebulizer solution 5 mg (not administered)  methylPREDNISolone sodium succinate (SOLU-MEDROL) 125 mg/2 mL injection 125 mg (not administered)     Initial Impression / Assessment and Plan / ED Course  I have  reviewed the triage vital signs and the nursing notes.  Pertinent labs & imaging results that were available during my care of the patient were reviewed by me and considered in my medical decision making (see chart for details).     Patient reexamined after son present.  Son feels that most of his poor communication due to being hard of hearing and not dementia. Review of labs revealed leukocytosis and acute kidney injury.  CT chest, abdomen, and pelvis are pending From oncology note 2017:  Assessment and Plan:  1) Stage 1 NSCLC - CT is highly suspicious for local progression but he is asymptomatic at this point. We discussed waiting until this causes symptoms before pursuing interventions. He has had some cognitive decline and it is not clear whether this will become symptomatic during his lifespan.  2) Recent pneumonia - This has cleared on recent Abx. It is possible this could be confounding the radiographic findings.  I spent 25 minutes on this encounter and more than 50% of this time was spent on face-to-face counseling.  Electronically signed by: Leonette Nutting, MD 02/23/2016 6:37 PM  Electronically signed by: Michell Heinrich, MD 02/23/16 1840     1- cough/sputum production- ct with large mass lul and rul, ddx includes mycobacteri, but given history of lung ca-see above, most likely neoplastic.  Discussed with son and advises he is POA.  He would like initial treatment and evaluation for infection, but does not think his father would want aggressive treatment for neoplasm 2- hyperkalemia- patient being hydrated and kayexalate ordered 3- elevated creatinine at 2.16 with last 1.5 in 8/9.    Discussed with Dr. Myna Hidalgo and he will see for admission- will hold antibiotics for now and he will evaluate.  Final Clinical Impressions(s) / ED Diagnoses   Final diagnoses:  Lung mass  AKI (acute kidney injury) [facility])  Hyperkalemia    ED Discharge Orders    None       Pattricia Boss, MD 10/15/17 [place]    Pattricia Boss, MD 10/15/17 [phone]

## 2017-10-15 NOTE — ED Notes (Signed)
Report given to 300 RN at this time.

## 2017-10-15 NOTE — H&P (Signed)
History and Physical    THEDORE PICKEL GQQ:[id number] DOB: 11/14/1922 DOA: 10/15/2017  PCP: Sinda Du, MD   Patient coming from: Nursing home  Chief Complaint: Cough productive of dark brown sputum   HPI: JOVAHN BREIT is a 84 y.o. male with medical history significant for chronic kidney disease stage IV, COPD, history of lung cancer with recurrence, now presenting to the emergency department from his nursing facility for evaluation of increased cough with production of dark brown sputum.  Patient has reportedly worsening cough at his nursing facility sputum.  This was first noted approximately 1 month ago persisted, and is worsening.  Patient has not been expressing any complaints per the report of family at bedside.  He had been treated for lung cancer followed at [facility], have a suspected recurrence lack of symptoms at that time, decision was made to monitor.  Family at the bedside do not believe that he would not want any aggressive treatment.  ED Course: Upon arrival to the ED, patient is found to be afebrile, saturating 90% on room air slightly tachycardic, and with vitals otherwise stable.  EKG features a sinus arrhythmia with left axis deviation and chest x-ray is notable for mild bibasilar atelectasis.  Chemistry panel reveals a potassium of 5.4 and creatinine of 2.16, up from 1.50 remotely.  CBC is notable for leukocytosis to 15,300 and a normocytic anemia with BNP is normal.  CT chest reveals a large irregular mass within the left upper lobe with central cavitation and apparent invasion of the adjacent thoracic vertebrae and ribs lung mass on CT is concerning for pulmonary malignancy, but with central cavitation, mycobacterial infection is not entirely excluded.  Also noted on the chest CT is right upper lobe mass concerning for additional pulmonary primary or metastatic disease.  Patient was placed on airborne precautions and treated with continuous albuterol neb, 1 L of normal saline,  125 mg IV Solu-Medrol, and 30 g of Kayexalate.  He remains hemodynamically stable, in no acute respiratory distress, and will be admitted to the telemetry unit for ongoing evaluation and management of cavitary lung mass.  Review of Systems:  All other systems reviewed and apart from HPI, are negative.  Past Medical History:  Diagnosis Date  . Back pain   . BPH (benign prostatic hyperplasia)   . C. difficile diarrhea   . CKD (chronic kidney disease) stage 4, GFR 15-29 ml/min (HCC)    Creatinine 2.0  . COPD (chronic obstructive pulmonary disease) ([place])   . Hx of adenomatous colonic polyps   . Lung cancer ([place])   . S/P colonoscopy 2002, 2007   Hyperplastic polyps 2002; pancolonic diverticula 2007  . Shortness of breath     Past Surgical History:  Procedure Laterality Date  . APPENDECTOMY    . CHOLECYSTECTOMY    . EYE SURGERY     cataracts removed with lens implant  . HERNIA REPAIR  06/08/2014   WITH MESH  . Hernia surgery X 3     Inguinal hernia repair  . INGUINAL HERNIA REPAIR Right 06/08/2014   Procedure: RIGHT INGUINAL HERNIA REPAIR WITH MESH;  Surgeon: Jackolyn Confer, MD;  Location: [facility];  Service: General;  Laterality: Right;  . INSERTION OF MESH Right 06/08/2014   Procedure: INSERTION OF MESH;  Surgeon: Jackolyn Confer, MD;  Location: [facility];  Service: General;  Laterality: Right;  . Radiofrequency ablation for lung cancer    . TRANSURETHRAL RESECTION OF PROSTATE N/A 07/19/2014   Procedure: TRANSURETHRAL RESECTION  OF THE PROSTATE WITH GYRUS INSTRUMENTS;  Surgeon: Malka So, MD;  Location: [facility];  Service: Urology;  Laterality: N/A;     reports that he quit smoking about 14 years ago. His smoking use included cigarettes. He has a 60.00 pack-year smoking history. he has never used smokeless tobacco. He reports that he does not drink alcohol or use drugs.  Allergies  Allergen Reactions  . Penicillins Swelling    Has patient had a PCN reaction causing immediate rash,  facial/tongue/throat swelling, SOB or lightheadedness with hypotension: Unknown Has patient had a PCN reaction causing severe rash involving mucus membranes or skin necrosis: Unknown Has patient had a PCN reaction that required hospitalization: Unknown Has patient had a PCN reaction occurring within the last 10 years: Unknown If all of the above answers are "NO", then may proceed with Cephalosporin use.     Family History  Problem Relation Age of Onset  . Heart failure Father        Died in his 8s  . CAD Brother        Not premature     Prior to Admission medications   Medication Sig Start Date End Date Taking? Authorizing Provider  albuterol (PROVENTIL HFA;VENTOLIN HFA) 108 (90 BASE) MCG/ACT inhaler Inhale 2 puffs into the lungs every 6 (six) hours as needed for wheezing or shortness of breath.   Yes [provider]  donepezil (ARICEPT) 10 MG tablet Take 1 tablet by mouth at bedtime.  03/07/15  Yes [provider]  DULoxetine (CYMBALTA) 30 MG capsule Take 30 mg by mouth daily. 07/17/14  Yes [provider]  fexofenadine (ALLEGRA) 180 MG tablet Take 180 mg by mouth daily.   Yes [provider]  fluticasone (FLONASE) 50 MCG/ACT nasal spray Place 2 sprays into both nostrils daily.   Yes [provider]  fluticasone furoate-vilanterol (BREO ELLIPTA) 100-25 MCG/INH AEPB Inhale 1 puff into the lungs daily.   Yes [provider]  gabapentin (NEURONTIN) 300 MG capsule Take 300 mg by mouth daily.    Yes [provider]  guaiFENesin (ROBITUSSIN) 100 MG/5ML SOLN Take 5 mLs by mouth every 6 (six) hours as needed for cough or to loosen phlegm.   Yes [provider]  HYDROcodone-acetaminophen (NORCO/VICODIN) 5-325 MG tablet Take 1 tablet by mouth 2 (two) times daily.   Yes [provider]  ketoconazole (NIZORAL) 2 % shampoo Apply 1 application topically every Monday, Wednesday, and Friday. Applied top head topically on  MWF at [place] for seborreic dermatitis. Leave on for 15 minutes   Yes [provider]  levofloxacin (LEVAQUIN) 500 MG tablet Take 500 mg by mouth daily. 7 day course starting on 10/09/2017   Yes [provider]  predniSONE (DELTASONE) 5 MG tablet Take 5 mg by mouth daily with breakfast.   Yes [provider]  triamcinolone cream (KENALOG) 0.1 % Apply 1 application topically 2 (two) times daily as needed (for rash/dermatitis).   Yes [provider]    Physical Exam: Vitals:   10/15/17 1708 10/15/17 1730 10/15/17 1830 10/15/17 1930  BP:  120/70 (!) 124/59 113/66  Pulse:  97 100 (!) 108  Resp:  13 10 16   Temp: 97.8 F (36.6 C)     TempSrc: Rectal     SpO2:  92% 90% 90%  Weight:      Height:          Constitutional: NAD, calm  Eyes: PERTLA, lids and conjunctivae normal ENMT: Mucous membranes  are moist. Posterior pharynx clear of any exudate or lesions.   Neck: normal, supple, no masses, no thyromegaly Respiratory: Diminished bilaterally, rhonchi on the left, diffuse expiratory wheezes. No accessory muscle use.  Cardiovascular: S1 & S2 heard, regular rate and rhythm. No significant JVD. Abdomen: No distension, no tenderness, no masses palpated. Bowel sounds normal.  Musculoskeletal: no clubbing / cyanosis. No joint deformity upper and lower extremities.   Skin: no significant rashes, lesions, ulcers. Warm, dry, well-perfused. Neurologic: CN 2-12 grossly intact. Sensation intact. Strength 5/5 in all 4 limbs. Gross hearing deficit. Psychiatric: Alert and oriented. Calm, cooperative.     Labs on Admission: I have personally reviewed following labs and imaging studies  CBC: Recent Labs  Lab 10/15/17 1621  WBC 15.3*  NEUTROABS 8.4*  HGB 11.0*  HCT 36.9*  MCV 90.7  PLT 130   Basic Metabolic Panel: Recent Labs  Lab 10/15/17 1621  NA 141  K 5.4*  CL 106  CO2 26  GLUCOSE 114*  BUN 52*  CREATININE 2.16*  CALCIUM 9.5   GFR: Estimated  Creatinine Clearance: 22.5 mL/min (A) (by C-G formula based on SCr of 2.16 mg/dL (H)). Liver Function Tests: Recent Labs  Lab 10/15/17 1621  AST 18  ALT 22  ALKPHOS 77  BILITOT 0.4  PROT 6.3*  ALBUMIN 3.2*   Recent Labs  Lab 10/15/17 1621  LIPASE 32   No results for input(s): AMMONIA in the last 168 hours. Coagulation Profile: No results for input(s): INR, PROTIME in the last 168 hours. Cardiac Enzymes: Recent Labs  Lab 10/15/17 1621  TROPONINI <0.03   BNP (last 3 results) No results for input(s): PROBNP in the last 8760 hours. HbA1C: No results for input(s): HGBA1C in the last 72 hours. CBG: No results for input(s): GLUCAP in the last 168 hours. Lipid Profile: No results for input(s): CHOL, HDL, LDLCALC, TRIG, CHOLHDL, LDLDIRECT in the last 72 hours. Thyroid Function Tests: No results for input(s): TSH, T4TOTAL, FREET4, T3FREE, THYROIDAB in the last 72 hours. Anemia Panel: No results for input(s): VITAMINB12, FOLATE, FERRITIN, TIBC, IRON, RETICCTPCT in the last 72 hours. Urine analysis:    Component Value Date/Time   COLORURINE YELLOW 10/15/2017 1800   APPEARANCEUR CLEAR 10/15/2017 1800   LABSPEC 1.016 10/15/2017 1800   PHURINE 5.0 10/15/2017 1800   GLUCOSEU NEGATIVE 10/15/2017 1800   HGBUR NEGATIVE 10/15/2017 1800   BILIRUBINUR NEGATIVE 10/15/2017 1800   KETONESUR NEGATIVE 10/15/2017 1800   PROTEINUR NEGATIVE 10/15/2017 1800   UROBILINOGEN 0.2 04/28/2015 0153   NITRITE NEGATIVE 10/15/2017 1800   LEUKOCYTESUR NEGATIVE 10/15/2017 1800   Sepsis Labs: @LABRCNTIP (procalcitonin:4,lacticidven:4) )No results found for this or any previous visit (from the past 240 hour(s)).   Radiological Exams on Admission: Ct Abdomen Pelvis Wo Contrast  Result Date: 10/15/2017 CLINICAL DATA:  Patient with vomiting.  Bilateral shoulder pain. EXAM: CT CHEST, ABDOMEN AND PELVIS WITHOUT CONTRAST TECHNIQUE: Multidetector CT imaging of the chest, abdomen and pelvis was performed  following the standard protocol without IV contrast. COMPARISON:  CT abdomen pelvis 06/16/2014; the PET-CT 01/01/2006. FINDINGS: CT CHEST FINDINGS Cardiovascular: Normal heart size. No pericardial effusion. Thoracic aortic vascular calcifications. Mediastinum/Nodes: Enlarged left axillary lymph node measuring 1.4 cm (image 23; series 2). Enlarged right paratracheal lymph node measuring 1.3 cm (image 27; series 2). Additional prominent and mildly enlarged mediastinal lymph nodes are demonstrated particularly within a right paratracheal location. Findings suggestive of left hilar adenopathy although measurement is difficult given lack of IV contrast. The esophagus is normal in  appearance. Lungs/Pleura: Dependent mucus within the trachea. Subpleural scarring/atelectasis within the right lower lobe. 8 mm subpleural nodule right lower lobe (image 100; series 3), increased from prior were it measured 5 mm. Centrilobular and paraseptal emphysematous changes. Within the right upper lobe there is a 3.1 x 2.1 cm mass (image 51; series 3). Within the medial left upper lobe there is a 6.2 x 3.7 cm irregular mass with central cavitation (image 51; series 3). This extends inferiorly into the left suprahilar location. Multiple small nodules within the peripheral left upper lobe (image 63; series 3). New 6 mm left lower lobe nodule (image 94; series 3). Small amount of left apical pleural gas (image 45; series 3). Mucous plugging within the lower lobe bronchi bilaterally. Musculoskeletal: Thoracic spine degenerative changes. Healing fractures involving the posterior left third and fourth ribs. There is associated sclerosis at these locations. There is lytic destruction involving the left aspect of the L2 vertebral body (image 35; series 3), from the adjacent left upper lobe mass. CT ABDOMEN PELVIS FINDINGS Hepatobiliary: Liver is normal in size and contour. Gallbladder is surgically absent. Pancreas: Unremarkable Spleen:  Unremarkable Adrenals/Urinary Tract: Adrenal glands are normal. Kidneys are symmetric in size. There is a 4 cm exophytic cystic lesion off the interpolar region of the left kidney. Urinary bladder is unremarkable. Too small to characterize subcentimeter hyperdense lesion interpolar region right kidney (image 68; series 2). Stomach/Bowel: Stool throughout the colon. No abnormal bowel wall thickening or evidence for bowel obstruction. Normal morphology of the stomach. Descending duodenum diverticulum. Vascular/Lymphatic: Normal caliber abdominal aorta. Peripheral calcified atherosclerotic plaque. No retroperitoneal lymphadenopathy. Reproductive: Prostate is enlarged. Central dystrophic calcifications. Other: None. Musculoskeletal: Osseous demineralization. Stable heterogeneity of the sacral ala bilaterally. Lumbar spine degenerative changes. IMPRESSION: 1. Large irregular mass within the paramediastinal left upper lobe with central cavitation. The mass appears to invade the adjacent thoracic vertebral body and ribs. Findings are concerning for pulmonary malignancy. Given the central cavitation, mycobacterial infectious process is not entirely excluded. 2. Right upper lobe mass concerning for additional pulmonary primary or metastatic lesion. 3. Scattered pulmonary nodularity which may represent metastatic disease. Peripheral left upper lobe nodularity may be infectious/inflammatory in etiology. 4. Left axillary and mediastinal adenopathy concerning for metastatic adenopathy. 5. These results were called by telephone at the time of interpretation on 10/15/2017 at 6:56 pm to Dr. Pattricia Boss , who verbally acknowledged these results. Electronically Signed   By: Lovey Newcomer M.D.   On: 10/15/2017 19:02   Ct Chest Wo Contrast  Result Date: 10/15/2017 CLINICAL DATA:  Patient with vomiting.  Bilateral shoulder pain. EXAM: CT CHEST, ABDOMEN AND PELVIS WITHOUT CONTRAST TECHNIQUE: Multidetector CT imaging of the chest,  abdomen and pelvis was performed following the standard protocol without IV contrast. COMPARISON:  CT abdomen pelvis 06/16/2014; the PET-CT 01/01/2006. FINDINGS: CT CHEST FINDINGS Cardiovascular: Normal heart size. No pericardial effusion. Thoracic aortic vascular calcifications. Mediastinum/Nodes: Enlarged left axillary lymph node measuring 1.4 cm (image 23; series 2). Enlarged right paratracheal lymph node measuring 1.3 cm (image 27; series 2). Additional prominent and mildly enlarged mediastinal lymph nodes are demonstrated particularly within a right paratracheal location. Findings suggestive of left hilar adenopathy although measurement is difficult given lack of IV contrast. The esophagus is normal in appearance. Lungs/Pleura: Dependent mucus within the trachea. Subpleural scarring/atelectasis within the right lower lobe. 8 mm subpleural nodule right lower lobe (image 100; series 3), increased from prior were it measured 5 mm. Centrilobular and paraseptal emphysematous changes. Within the right upper  lobe there is a 3.1 x 2.1 cm mass (image 51; series 3). Within the medial left upper lobe there is a 6.2 x 3.7 cm irregular mass with central cavitation (image 51; series 3). This extends inferiorly into the left suprahilar location. Multiple small nodules within the peripheral left upper lobe (image 63; series 3). New 6 mm left lower lobe nodule (image 94; series 3). Small amount of left apical pleural gas (image 45; series 3). Mucous plugging within the lower lobe bronchi bilaterally. Musculoskeletal: Thoracic spine degenerative changes. Healing fractures involving the posterior left third and fourth ribs. There is associated sclerosis at these locations. There is lytic destruction involving the left aspect of the L2 vertebral body (image 35; series 3), from the adjacent left upper lobe mass. CT ABDOMEN PELVIS FINDINGS Hepatobiliary: Liver is normal in size and contour. Gallbladder is surgically absent.  Pancreas: Unremarkable Spleen: Unremarkable Adrenals/Urinary Tract: Adrenal glands are normal. Kidneys are symmetric in size. There is a 4 cm exophytic cystic lesion off the interpolar region of the left kidney. Urinary bladder is unremarkable. Too small to characterize subcentimeter hyperdense lesion interpolar region right kidney (image 68; series 2). Stomach/Bowel: Stool throughout the colon. No abnormal bowel wall thickening or evidence for bowel obstruction. Normal morphology of the stomach. Descending duodenum diverticulum. Vascular/Lymphatic: Normal caliber abdominal aorta. Peripheral calcified atherosclerotic plaque. No retroperitoneal lymphadenopathy. Reproductive: Prostate is enlarged. Central dystrophic calcifications. Other: None. Musculoskeletal: Osseous demineralization. Stable heterogeneity of the sacral ala bilaterally. Lumbar spine degenerative changes. IMPRESSION: 1. Large irregular mass within the paramediastinal left upper lobe with central cavitation. The mass appears to invade the adjacent thoracic vertebral body and ribs. Findings are concerning for pulmonary malignancy. Given the central cavitation, mycobacterial infectious process is not entirely excluded. 2. Right upper lobe mass concerning for additional pulmonary primary or metastatic lesion. 3. Scattered pulmonary nodularity which may represent metastatic disease. Peripheral left upper lobe nodularity may be infectious/inflammatory in etiology. 4. Left axillary and mediastinal adenopathy concerning for metastatic adenopathy. 5. These results were called by telephone at the time of interpretation on 10/15/2017 at 6:56 pm to Dr. Pattricia Boss , who verbally acknowledged these results. Electronically Signed   By: Lovey Newcomer M.D.   On: 10/15/2017 19:02   Dg Chest Port 1 View  Result Date: 10/15/2017 CLINICAL DATA:  Cough EXAM: PORTABLE CHEST 1 VIEW COMPARISON:  04/04/2015 FINDINGS: Hypoventilation with mild bibasilar atelectasis. No  definite pneumonia. Negative for heart failure or effusion. Atherosclerotic aorta IMPRESSION: Mild bibasilar atelectasis. Electronically Signed   By: Franchot Gallo M.D.   On: 10/15/2017 16:43    EKG: Independently reviewed. Sinus arrhythmia, LAD.   Assessment/Plan  1. Cavitating lung mass; Hx of lung cancer  - Presents from nursing home with 1 month progressive cough, productive of dark brown sputum  - CT chest reveals cavitary lesion in LUL with apparent invasion of adjacent vertebrae and ribs, RUL mass, and adenopathy  - Lung cancer recurrence felt to be most likely explanation, though TB not entirely excluded   - Check sputum culture with AFB stain and cultures, start Unasyn, maintain airborne precautions for now, continue supportive care with prn supplemental O2 and nebs   2. CKD IV; hyperkalemia  - SCr is 2.16 on admission, up from 1.5 on most recent prior chem panel from 2016  - Serum potassium is elevated to 5.4 on admission  - Treated in ED with 30 g Kayexelate and 1 liter of NS  - Renally-dose medications, continue gentle IVF hydration, continue  cardiac monitoring, repeat chemistries in am    3. COPD  - Presents with increased cough, productive of brown sputum - Not in any distress on admission, but significant wheezing noted  - Treated with continous albuterol neb and 125 mg IV Solu-medrol in ED  - Continue ICS/LABA, prn albuterol, systemic steroid    4. Dementia  - Continue Aricept    DVT prophylaxis: sq heparin  Code Status: DNR Family Communication: Son and daughter updated at bedside Disposition Plan: Admit to telemetry Consults called: None Admission status: Inpatient    Vianne Bulls, MD [facility] Pager [phone]  If 7PM-7AM, please contact night-coverage [email or web address] Password [facility]  10/15/2017, 8:03 PM

## 2017-10-15 NOTE — ED Triage Notes (Signed)
Patient bought in by [facility] from [place] for vomiting "dark color" x1 week. Patient complains of bilateral shoulder pain. Denies chest pain shortness of breath. Patient is HOH.

## 2017-10-16 LAB — CBC WITH DIFFERENTIAL/PLATELET
BASOS PCT: 0 %
Basophils Absolute: 0 10*3/uL (ref 0.0–0.1)
EOS ABS: 0 10*3/uL (ref 0.0–0.7)
Eosinophils Relative: 0 %
HEMATOCRIT: 33.7 % — AB (ref 39.0–52.0)
HEMOGLOBIN: 10.2 g/dL — AB (ref 13.0–17.0)
LYMPHS ABS: 6.1 10*3/uL — AB (ref 0.7–4.0)
LYMPHS PCT: 46 %
MCH: 27.3 pg (ref 26.0–34.0)
MCHC: 30.3 g/dL (ref 30.0–36.0)
MCV: 90.1 fL (ref 78.0–100.0)
MONOS PCT: 1 %
Monocytes Absolute: 0.1 10*3/uL (ref 0.1–1.0)
NEUTROS ABS: 7 10*3/uL (ref 1.7–7.7)
Neutrophils Relative %: 53 %
Platelets: 223 10*3/uL (ref 150–400)
RBC: 3.74 MIL/uL — ABNORMAL LOW (ref 4.22–5.81)
RDW: 15.1 % (ref 11.5–15.5)
WBC: 13.2 10*3/uL — ABNORMAL HIGH (ref 4.0–10.5)

## 2017-10-16 LAB — EXPECTORATED SPUTUM ASSESSMENT W GRAM STAIN, RFLX TO RESP C

## 2017-10-16 LAB — BASIC METABOLIC PANEL
ANION GAP: 10 (ref 5–15)
BUN: 43 mg/dL — AB (ref 6–20)
CALCIUM: 8.6 mg/dL — AB (ref 8.9–10.3)
CO2: 25 mmol/L (ref 22–32)
Chloride: 106 mmol/L (ref 101–111)
Creatinine, Ser: 1.98 mg/dL — ABNORMAL HIGH (ref 0.61–1.24)
GFR calc Af Amer: 32 mL/min — ABNORMAL LOW (ref >60)
GFR, EST NON AFRICAN AMERICAN: 28 mL/min — AB (ref >60)
GLUCOSE: 165 mg/dL — AB (ref 65–99)
Potassium: 4.1 mmol/L (ref 3.5–5.1)
Sodium: 141 mmol/L (ref 135–145)

## 2017-10-16 LAB — MRSA PCR SCREENING: MRSA BY PCR: NEGATIVE

## 2017-10-16 LAB — STREP PNEUMONIAE URINARY ANTIGEN: STREP PNEUMO URINARY ANTIGEN: NEGATIVE

## 2017-10-16 MED ORDER — MEROPENEM 1 G IV SOLR
1.0000 g | Freq: Once | INTRAVENOUS | Status: AC
  Administered 2017-10-16: 1 g via INTRAVENOUS
  Filled 2017-10-16 (×2): qty 1

## 2017-10-16 MED ORDER — SODIUM CHLORIDE 0.9 % IV SOLN
1.0000 g | Freq: Two times a day (BID) | INTRAVENOUS | Status: DC
  Administered 2017-10-16 – 2017-10-22 (×12): 1 g via INTRAVENOUS
  Filled 2017-10-16 (×19): qty 1

## 2017-10-16 NOTE — Progress Notes (Signed)
ANTIBIOTIC CONSULT NOTE-Preliminary  Pharmacy Consult for Meropenem Indication: Cavitary lung lesions  Allergies  Allergen Reactions  . Penicillins Swelling    Has patient had a PCN reaction causing immediate rash, facial/tongue/throat swelling, SOB or lightheadedness with hypotension: Unknown Has patient had a PCN reaction causing severe rash involving mucus membranes or skin necrosis: Unknown Has patient had a PCN reaction that required hospitalization: Unknown Has patient had a PCN reaction occurring within the last 10 years: Unknown If all of the above answers are "NO", then may proceed with Cephalosporin use.     Patient Measurements: Height: 5\' 10"  (177.8 cm) Weight: 148 lb 5.9 oz (67.3 kg) IBW/kg (Calculated) : 73  Vital Signs: Temp: 97.7 F (36.5 C) (01/25 2300) Temp Source: Oral (01/25 2300) BP: 109/62 (01/25 2300) Pulse Rate: 112 (01/25 2300)  Labs: Recent Labs    10/15/17 1621  WBC 15.3*  HGB 11.0*  PLT 240  CREATININE 2.16*    Estimated Creatinine Clearance: 20.8 mL/min (A) (by C-G formula based on SCr of 2.16 mg/dL (H)).  No results for input(s): VANCOTROUGH, VANCOPEAK, VANCORANDOM, GENTTROUGH, GENTPEAK, GENTRANDOM, TOBRATROUGH, TOBRAPEAK, TOBRARND, AMIKACINPEAK, AMIKACINTROU, AMIKACIN in the last 72 hours.   Microbiology: Recent Results (from the past 720 hour(s))  Culture, blood (routine x 2) Call MD if unable to obtain prior to antibiotics being given     Status: None (Preliminary result)   Collection Time: 10/15/17  8:13 PM  Result Value Ref Range Status   Specimen Description LEFT ANTECUBITAL  Final   Special Requests   Final    BOTTLES DRAWN AEROBIC AND ANAEROBIC Blood Culture adequate volume   Culture PENDING  Incomplete   Report Status PENDING  Incomplete  Culture, blood (routine x 2) Call MD if unable to obtain prior to antibiotics being given     Status: None (Preliminary result)   Collection Time: 10/15/17  8:38 PM  Result Value Ref Range  Status   Specimen Description BLOOD RIGHT HAND  Final   Special Requests   Final    BOTTLES DRAWN AEROBIC AND ANAEROBIC Blood Culture adequate volume   Culture PENDING  Incomplete   Report Status PENDING  Incomplete  MRSA PCR Screening     Status: None   Collection Time: 10/15/17 11:32 PM  Result Value Ref Range Status   MRSA by PCR NEGATIVE NEGATIVE Final    Comment:        The GeneXpert MRSA Assay (FDA approved for NASAL specimens only), is one component of a comprehensive MRSA colonization surveillance program. It is not intended to diagnose MRSA infection nor to guide or monitor treatment for MRSA infections.     Medical History: Past Medical History:  Diagnosis Date  . Back pain   . BPH (benign prostatic hyperplasia)   . C. difficile diarrhea   . CKD (chronic kidney disease) stage 4, GFR 15-29 ml/min (HCC)    Creatinine 2.0  . COPD (chronic obstructive pulmonary disease) ([place])   . Hx of adenomatous colonic polyps   . Lung cancer ([place])   . S/P colonoscopy 2002, 2007   Hyperplastic polyps 2002; pancolonic diverticula 2007  . Shortness of breath     Medications:   Assessment: 84 yo male nursing home resident with medical history sig for lung cancer with recurrence, seen in the ED for evaluation of increased cough with dark sputum. CT shows large LUL mass with central cavitation and RUL mass concerning for primary or metastatic disease. Also cannot r/o mycobacterial disease. Pharmacy has been  asked to provide meropenem dosing.  Goal of Therapy:  Eradicate infection  Plan:  Preliminary review of pertinent patient information completed.  Protocol will be initiated with 1 dose of meropenem 1000 mg IV.  Forestine Na clinical pharmacist will complete review during morning rounds to assess patient and finalize treatment regimen if needed.  Norberto Sorenson, [facility] 10/16/2017,3:04 AM

## 2017-10-16 NOTE — Progress Notes (Signed)
ANTIBIOTIC CONSULT NOTE- follow up  Pharmacy Consult for Meropenem Indication: Cavitary lung lesions  Allergies  Allergen Reactions  . Penicillins Swelling    Has patient had a PCN reaction causing immediate rash, facial/tongue/throat swelling, SOB or lightheadedness with hypotension: Unknown Has patient had a PCN reaction causing severe rash involving mucus membranes or skin necrosis: Unknown Has patient had a PCN reaction that required hospitalization: Unknown Has patient had a PCN reaction occurring within the last 10 years: Unknown If all of the above answers are "NO", then may proceed with Cephalosporin use.    Patient Measurements: Height: 5\' 10"  (177.8 cm) Weight: 148 lb 5.9 oz (67.3 kg) IBW/kg (Calculated) : 73  Vital Signs:    Labs: Recent Labs    10/15/17 1621 10/16/17 0534  WBC 15.3* 13.2*  HGB 11.0* 10.2*  PLT 240 223  CREATININE 2.16* 1.98*   Estimated Creatinine Clearance: 22.7 mL/min (A) (by C-G formula based on SCr of 1.98 mg/dL (H)).  No results for input(s): VANCOTROUGH, VANCOPEAK, VANCORANDOM, GENTTROUGH, GENTPEAK, GENTRANDOM, TOBRATROUGH, TOBRAPEAK, TOBRARND, AMIKACINPEAK, AMIKACINTROU, AMIKACIN in the last 72 hours.   Microbiology: Recent Results (from the past 720 hour(s))  Culture, blood (routine x 2) Call MD if unable to obtain prior to antibiotics being given     Status: None (Preliminary result)   Collection Time: 10/15/17  8:13 PM  Result Value Ref Range Status   Specimen Description LEFT ANTECUBITAL  Final   Special Requests   Final    BOTTLES DRAWN AEROBIC AND ANAEROBIC Blood Culture adequate volume   Culture NO GROWTH < 12 HOURS  Final   Report Status PENDING  Incomplete  Culture, blood (routine x 2) Call MD if unable to obtain prior to antibiotics being given     Status: None (Preliminary result)   Collection Time: 10/15/17  8:38 PM  Result Value Ref Range Status   Specimen Description BLOOD RIGHT HAND  Final   Special Requests   Final     BOTTLES DRAWN AEROBIC AND ANAEROBIC Blood Culture adequate volume   Culture PENDING  Incomplete   Report Status PENDING  Incomplete  MRSA PCR Screening     Status: None   Collection Time: 10/15/17 11:32 PM  Result Value Ref Range Status   MRSA by PCR NEGATIVE NEGATIVE Final    Comment:        The GeneXpert MRSA Assay (FDA approved for NASAL specimens only), is one component of a comprehensive MRSA colonization surveillance program. It is not intended to diagnose MRSA infection nor to guide or monitor treatment for MRSA infections.    Medical History: Past Medical History:  Diagnosis Date  . Back pain   . BPH (benign prostatic hyperplasia)   . C. difficile diarrhea   . CKD (chronic kidney disease) stage 4, GFR 15-29 ml/min (HCC)    Creatinine 2.0  . COPD (chronic obstructive pulmonary disease) ([place])   . Hx of adenomatous colonic polyps   . Lung cancer ([place])   . S/P colonoscopy 2002, 2007   Hyperplastic polyps 2002; pancolonic diverticula 2007  . Shortness of breath    Assessment: 84 yo male nursing home resident with medical history sig for lung cancer with recurrence, seen in the ED for evaluation of increased cough with dark sputum. CT shows large LUL mass with central cavitation and RUL mass concerning for primary or metastatic disease. Also cannot r/o mycobacterial disease. Pharmacy has been asked to provide meropenem dosing.  Goal of Therapy:  Eradicate infection  Plan:  Meropenem 1gm IV q12hrs (renally adjusted) Monitor labs, progress, cultures  [place] Crane, Leonda Cristo A, RPH 10/16/2017,11:10 AM

## 2017-10-16 NOTE — Progress Notes (Signed)
Pt has penicillin allergy, unasyn order discontinued by pharmacist. MD made aware of need for new antibiotic order.

## 2017-10-16 NOTE — Progress Notes (Signed)
Subjective: He was admitted yesterday with a lung mass.  He lives at an assisted living facility so even though I think tuberculosis is very unlikely we do need to try to rule that out.  According to his son he is not producing any sputum now he is very hard of hearing.  He has COPD at baseline also.  He has no complaints now.  His potassium was elevated in the emergency department.  Objective: Vital signs in last 24 hours: Temp:  [97.7 F (36.5 C)-97.8 F (36.6 C)] 97.7 F (36.5 C) (01/25 2300) Pulse Rate:  [90-112] 112 (01/25 2300) Resp:  [10-17] 16 (01/25 2300) BP: (107-125)/(59-78) 109/62 (01/25 2300) SpO2:  [90 %-100 %] 95 % (01/25 2300) Weight:  [67.3 kg (148 lb 5.9 oz)-79.4 kg (175 lb)] 67.3 kg (148 lb 5.9 oz) (01/25 2300) Weight change:  Last BM Date: 10/15/17  Intake/Output from previous day: 01/25 0701 - 01/26 0700 In: 1413.3 [I.V.:913.3; IV Piggyback:500] Out: 200 [Urine:200]  PHYSICAL EXAM General appearance: alert and Confused Resp: rhonchi bilaterally Cardio: regular rate and rhythm, S1, S2 normal, no murmur, click, rub or gallop GI: soft, non-tender; bowel sounds normal; no masses,  no organomegaly Extremities: extremities normal, atraumatic, no cyanosis or edema Very hard of hearing  Lab Results:  Results for orders placed or performed during the hospital encounter of 10/15/17 (from the past 48 hour(s))  CBC with Differential/Platelet     Status: Abnormal   Collection Time: 10/15/17  4:21 PM  Result Value Ref Range   WBC 15.3 (H) 4.0 - 10.5 K/uL   RBC 4.07 (L) 4.22 - 5.81 MIL/uL   Hemoglobin 11.0 (L) 13.0 - 17.0 g/dL   HCT 36.9 (L) 39.0 - 52.0 %   MCV 90.7 78.0 - 100.0 fL   MCH 27.0 26.0 - 34.0 pg   MCHC 29.8 (L) 30.0 - 36.0 g/dL   RDW 15.3 11.5 - 15.5 %   Platelets 240 150 - 400 K/uL   Neutrophils Relative % 55 %   Neutro Abs 8.4 (H) 1.7 - 7.7 K/uL   Lymphocytes Relative 40 %   Lymphs Abs 6.2 (H) 0.7 - 4.0 K/uL   Monocytes Relative 4 %   Monocytes  Absolute 0.6 0.1 - 1.0 K/uL   Eosinophils Relative 1 %   Eosinophils Absolute 0.1 0.0 - 0.7 K/uL   Basophils Relative 0 %   Basophils Absolute 0.0 0.0 - 0.1 K/uL  Comprehensive metabolic panel     Status: Abnormal   Collection Time: 10/15/17  4:21 PM  Result Value Ref Range   Sodium 141 135 - 145 mmol/L   Potassium 5.4 (H) 3.5 - 5.1 mmol/L   Chloride 106 101 - 111 mmol/L   CO2 26 22 - 32 mmol/L   Glucose, Bld 114 (H) 65 - 99 mg/dL   BUN 52 (H) 6 - 20 mg/dL   Creatinine, Ser 2.16 (H) 0.61 - 1.24 mg/dL   Calcium 9.5 8.9 - 10.3 mg/dL   Total Protein 6.3 (L) 6.5 - 8.1 g/dL   Albumin 3.2 (L) 3.5 - 5.0 g/dL   AST 18 15 - 41 U/L   ALT 22 17 - 63 U/L   Alkaline Phosphatase 77 38 - 126 U/L   Total Bilirubin 0.4 0.3 - 1.2 mg/dL   GFR calc non Af Amer 25 (L) >60 mL/min   GFR calc Af Amer 29 (L) >60 mL/min    Comment: (NOTE) The eGFR has been calculated using the CKD EPI equation. This  calculation has not been validated in all clinical situations. eGFR's persistently <60 mL/min signify possible Chronic Kidney Disease.    Anion gap 9 5 - 15  Lipase, blood     Status: None   Collection Time: 10/15/17  4:21 PM  Result Value Ref Range   Lipase 32 11 - 51 U/L  Troponin I     Status: None   Collection Time: 10/15/17  4:21 PM  Result Value Ref Range   Troponin I <0.03 <0.03 ng/mL  Brain natriuretic peptide     Status: None   Collection Time: 10/15/17  4:22 PM  Result Value Ref Range   B Natriuretic Peptide 70.0 0.0 - 100.0 pg/mL  POC occult blood, ED     Status: None   Collection Time: 10/15/17  4:47 PM  Result Value Ref Range   Fecal Occult Bld NEGATIVE NEGATIVE  Urinalysis, Routine w reflex microscopic     Status: None   Collection Time: 10/15/17  6:00 PM  Result Value Ref Range   Color, Urine YELLOW YELLOW   APPearance CLEAR CLEAR   Specific Gravity, Urine 1.016 1.005 - 1.030   pH 5.0 5.0 - 8.0   Glucose, UA NEGATIVE NEGATIVE mg/dL   Hgb urine dipstick NEGATIVE NEGATIVE    Bilirubin Urine NEGATIVE NEGATIVE   Ketones, ur NEGATIVE NEGATIVE mg/dL   Protein, ur NEGATIVE NEGATIVE mg/dL   Nitrite NEGATIVE NEGATIVE   Leukocytes, UA NEGATIVE NEGATIVE  Culture, blood (routine x 2) Call MD if unable to obtain prior to antibiotics being given     Status: None (Preliminary result)   Collection Time: 10/15/17  8:13 PM  Result Value Ref Range   Specimen Description LEFT ANTECUBITAL    Special Requests      BOTTLES DRAWN AEROBIC AND ANAEROBIC Blood Culture adequate volume   Culture NO GROWTH < 12 HOURS    Report Status PENDING   Culture, blood (routine x 2) Call MD if unable to obtain prior to antibiotics being given     Status: None (Preliminary result)   Collection Time: 10/15/17  8:38 PM  Result Value Ref Range   Specimen Description BLOOD RIGHT HAND    Special Requests      BOTTLES DRAWN AEROBIC AND ANAEROBIC Blood Culture adequate volume   Culture PENDING    Report Status PENDING   MRSA PCR Screening     Status: None   Collection Time: 10/15/17 11:32 PM  Result Value Ref Range   MRSA by PCR NEGATIVE NEGATIVE    Comment:        The GeneXpert MRSA Assay (FDA approved for NASAL specimens only), is one component of a comprehensive MRSA colonization surveillance program. It is not intended to diagnose MRSA infection nor to guide or monitor treatment for MRSA infections.   Basic metabolic panel     Status: Abnormal   Collection Time: 10/16/17  5:34 AM  Result Value Ref Range   Sodium 141 135 - 145 mmol/L   Potassium 4.1 3.5 - 5.1 mmol/L    Comment: DELTA CHECK NOTED   Chloride 106 101 - 111 mmol/L   CO2 25 22 - 32 mmol/L   Glucose, Bld 165 (H) 65 - 99 mg/dL   BUN 43 (H) 6 - 20 mg/dL   Creatinine, Ser 1.98 (H) 0.61 - 1.24 mg/dL   Calcium 8.6 (L) 8.9 - 10.3 mg/dL   GFR calc non Af Amer 28 (L) >60 mL/min   GFR calc Af Amer 32 (L) >60 mL/min  Comment: (NOTE) The eGFR has been calculated using the CKD EPI equation. This calculation has not been  validated in all clinical situations. eGFR's persistently <60 mL/min signify possible Chronic Kidney Disease.    Anion gap 10 5 - 15  CBC WITH DIFFERENTIAL     Status: Abnormal   Collection Time: 10/16/17  5:34 AM  Result Value Ref Range   WBC 13.2 (H) 4.0 - 10.5 K/uL   RBC 3.74 (L) 4.22 - 5.81 MIL/uL   Hemoglobin 10.2 (L) 13.0 - 17.0 g/dL   HCT 33.7 (L) 39.0 - 52.0 %   MCV 90.1 78.0 - 100.0 fL   MCH 27.3 26.0 - 34.0 pg   MCHC 30.3 30.0 - 36.0 g/dL   RDW 15.1 11.5 - 15.5 %   Platelets 223 150 - 400 K/uL   Neutrophils Relative % 53 %   Lymphocytes Relative 46 %   Monocytes Relative 1 %   Eosinophils Relative 0 %   Basophils Relative 0 %   Neutro Abs 7.0 1.7 - 7.7 K/uL   Lymphs Abs 6.1 (H) 0.7 - 4.0 K/uL   Monocytes Absolute 0.1 0.1 - 1.0 K/uL   Eosinophils Absolute 0.0 0.0 - 0.7 K/uL   Basophils Absolute 0.0 0.0 - 0.1 K/uL   WBC Morphology MILD LEFT SHIFT (1-5% METAS, OCC MYELO, OCC BANDS)     Comment: ABSOLUTE LYMPHOCYTOSIS ATYPICAL LYMPHOCYTES PENDING PATHOLOGIST REVIEW     ABGS No results for input(s): PHART, PO2ART, TCO2, HCO3 in the last 72 hours.  Invalid input(s): PCO2 CULTURES Recent Results (from the past 240 hour(s))  Culture, blood (routine x 2) Call MD if unable to obtain prior to antibiotics being given     Status: None (Preliminary result)   Collection Time: 10/15/17  8:13 PM  Result Value Ref Range Status   Specimen Description LEFT ANTECUBITAL  Final   Special Requests   Final    BOTTLES DRAWN AEROBIC AND ANAEROBIC Blood Culture adequate volume   Culture NO GROWTH < 12 HOURS  Final   Report Status PENDING  Incomplete  Culture, blood (routine x 2) Call MD if unable to obtain prior to antibiotics being given     Status: None (Preliminary result)   Collection Time: 10/15/17  8:38 PM  Result Value Ref Range Status   Specimen Description BLOOD RIGHT HAND  Final   Special Requests   Final    BOTTLES DRAWN AEROBIC AND ANAEROBIC Blood Culture adequate  volume   Culture PENDING  Incomplete   Report Status PENDING  Incomplete  MRSA PCR Screening     Status: None   Collection Time: 10/15/17 11:32 PM  Result Value Ref Range Status   MRSA by PCR NEGATIVE NEGATIVE Final    Comment:        The GeneXpert MRSA Assay (FDA approved for NASAL specimens only), is one component of a comprehensive MRSA colonization surveillance program. It is not intended to diagnose MRSA infection nor to guide or monitor treatment for MRSA infections.    Studies/Results: Ct Abdomen Pelvis Wo Contrast  Result Date: 10/15/2017 CLINICAL DATA:  Patient with vomiting.  Bilateral shoulder pain. EXAM: CT CHEST, ABDOMEN AND PELVIS WITHOUT CONTRAST TECHNIQUE: Multidetector CT imaging of the chest, abdomen and pelvis was performed following the standard protocol without IV contrast. COMPARISON:  CT abdomen pelvis 06/16/2014; the PET-CT 01/01/2006. FINDINGS: CT CHEST FINDINGS Cardiovascular: Normal heart size. No pericardial effusion. Thoracic aortic vascular calcifications. Mediastinum/Nodes: Enlarged left axillary lymph node measuring 1.4 cm (image 23;  series 2). Enlarged right paratracheal lymph node measuring 1.3 cm (image 27; series 2). Additional prominent and mildly enlarged mediastinal lymph nodes are demonstrated particularly within a right paratracheal location. Findings suggestive of left hilar adenopathy although measurement is difficult given lack of IV contrast. The esophagus is normal in appearance. Lungs/Pleura: Dependent mucus within the trachea. Subpleural scarring/atelectasis within the right lower lobe. 8 mm subpleural nodule right lower lobe (image 100; series 3), increased from prior were it measured 5 mm. Centrilobular and paraseptal emphysematous changes. Within the right upper lobe there is a 3.1 x 2.1 cm mass (image 51; series 3). Within the medial left upper lobe there is a 6.2 x 3.7 cm irregular mass with central cavitation (image 51; series 3). This  extends inferiorly into the left suprahilar location. Multiple small nodules within the peripheral left upper lobe (image 63; series 3). New 6 mm left lower lobe nodule (image 94; series 3). Small amount of left apical pleural gas (image 45; series 3). Mucous plugging within the lower lobe bronchi bilaterally. Musculoskeletal: Thoracic spine degenerative changes. Healing fractures involving the posterior left third and fourth ribs. There is associated sclerosis at these locations. There is lytic destruction involving the left aspect of the L2 vertebral body (image 35; series 3), from the adjacent left upper lobe mass. CT ABDOMEN PELVIS FINDINGS Hepatobiliary: Liver is normal in size and contour. Gallbladder is surgically absent. Pancreas: Unremarkable Spleen: Unremarkable Adrenals/Urinary Tract: Adrenal glands are normal. Kidneys are symmetric in size. There is a 4 cm exophytic cystic lesion off the interpolar region of the left kidney. Urinary bladder is unremarkable. Too small to characterize subcentimeter hyperdense lesion interpolar region right kidney (image 68; series 2). Stomach/Bowel: Stool throughout the colon. No abnormal bowel wall thickening or evidence for bowel obstruction. Normal morphology of the stomach. Descending duodenum diverticulum. Vascular/Lymphatic: Normal caliber abdominal aorta. Peripheral calcified atherosclerotic plaque. No retroperitoneal lymphadenopathy. Reproductive: Prostate is enlarged. Central dystrophic calcifications. Other: None. Musculoskeletal: Osseous demineralization. Stable heterogeneity of the sacral ala bilaterally. Lumbar spine degenerative changes. IMPRESSION: 1. Large irregular mass within the paramediastinal left upper lobe with central cavitation. The mass appears to invade the adjacent thoracic vertebral body and ribs. Findings are concerning for pulmonary malignancy. Given the central cavitation, mycobacterial infectious process is not entirely excluded. 2. Right  upper lobe mass concerning for additional pulmonary primary or metastatic lesion. 3. Scattered pulmonary nodularity which may represent metastatic disease. Peripheral left upper lobe nodularity may be infectious/inflammatory in etiology. 4. Left axillary and mediastinal adenopathy concerning for metastatic adenopathy. 5. These results were called by telephone at the time of interpretation on 10/15/2017 at 6:56 pm to Dr. Pattricia Boss , who verbally acknowledged these results. Electronically Signed   By: Lovey Newcomer M.D.   On: 10/15/2017 19:02   Ct Chest Wo Contrast  Result Date: 10/15/2017 CLINICAL DATA:  Patient with vomiting.  Bilateral shoulder pain. EXAM: CT CHEST, ABDOMEN AND PELVIS WITHOUT CONTRAST TECHNIQUE: Multidetector CT imaging of the chest, abdomen and pelvis was performed following the standard protocol without IV contrast. COMPARISON:  CT abdomen pelvis 06/16/2014; the PET-CT 01/01/2006. FINDINGS: CT CHEST FINDINGS Cardiovascular: Normal heart size. No pericardial effusion. Thoracic aortic vascular calcifications. Mediastinum/Nodes: Enlarged left axillary lymph node measuring 1.4 cm (image 23; series 2). Enlarged right paratracheal lymph node measuring 1.3 cm (image 27; series 2). Additional prominent and mildly enlarged mediastinal lymph nodes are demonstrated particularly within a right paratracheal location. Findings suggestive of left hilar adenopathy although measurement is difficult given lack  of IV contrast. The esophagus is normal in appearance. Lungs/Pleura: Dependent mucus within the trachea. Subpleural scarring/atelectasis within the right lower lobe. 8 mm subpleural nodule right lower lobe (image 100; series 3), increased from prior were it measured 5 mm. Centrilobular and paraseptal emphysematous changes. Within the right upper lobe there is a 3.1 x 2.1 cm mass (image 51; series 3). Within the medial left upper lobe there is a 6.2 x 3.7 cm irregular mass with central cavitation (image  51; series 3). This extends inferiorly into the left suprahilar location. Multiple small nodules within the peripheral left upper lobe (image 63; series 3). New 6 mm left lower lobe nodule (image 94; series 3). Small amount of left apical pleural gas (image 45; series 3). Mucous plugging within the lower lobe bronchi bilaterally. Musculoskeletal: Thoracic spine degenerative changes. Healing fractures involving the posterior left third and fourth ribs. There is associated sclerosis at these locations. There is lytic destruction involving the left aspect of the L2 vertebral body (image 35; series 3), from the adjacent left upper lobe mass. CT ABDOMEN PELVIS FINDINGS Hepatobiliary: Liver is normal in size and contour. Gallbladder is surgically absent. Pancreas: Unremarkable Spleen: Unremarkable Adrenals/Urinary Tract: Adrenal glands are normal. Kidneys are symmetric in size. There is a 4 cm exophytic cystic lesion off the interpolar region of the left kidney. Urinary bladder is unremarkable. Too small to characterize subcentimeter hyperdense lesion interpolar region right kidney (image 68; series 2). Stomach/Bowel: Stool throughout the colon. No abnormal bowel wall thickening or evidence for bowel obstruction. Normal morphology of the stomach. Descending duodenum diverticulum. Vascular/Lymphatic: Normal caliber abdominal aorta. Peripheral calcified atherosclerotic plaque. No retroperitoneal lymphadenopathy. Reproductive: Prostate is enlarged. Central dystrophic calcifications. Other: None. Musculoskeletal: Osseous demineralization. Stable heterogeneity of the sacral ala bilaterally. Lumbar spine degenerative changes. IMPRESSION: 1. Large irregular mass within the paramediastinal left upper lobe with central cavitation. The mass appears to invade the adjacent thoracic vertebral body and ribs. Findings are concerning for pulmonary malignancy. Given the central cavitation, mycobacterial infectious process is not entirely  excluded. 2. Right upper lobe mass concerning for additional pulmonary primary or metastatic lesion. 3. Scattered pulmonary nodularity which may represent metastatic disease. Peripheral left upper lobe nodularity may be infectious/inflammatory in etiology. 4. Left axillary and mediastinal adenopathy concerning for metastatic adenopathy. 5. These results were called by telephone at the time of interpretation on 10/15/2017 at 6:56 pm to Dr. Pattricia Boss , who verbally acknowledged these results. Electronically Signed   By: Lovey Newcomer M.D.   On: 10/15/2017 19:02   Dg Chest Port 1 View  Result Date: 10/15/2017 CLINICAL DATA:  Cough EXAM: PORTABLE CHEST 1 VIEW COMPARISON:  04/04/2015 FINDINGS: Hypoventilation with mild bibasilar atelectasis. No definite pneumonia. Negative for heart failure or effusion. Atherosclerotic aorta IMPRESSION: Mild bibasilar atelectasis. Electronically Signed   By: Franchot Gallo M.D.   On: 10/15/2017 16:43    Medications:  Prior to Admission:  Medications Prior to Admission  Medication Sig Dispense Refill Last Dose  . albuterol (PROVENTIL HFA;VENTOLIN HFA) 108 (90 BASE) MCG/ACT inhaler Inhale 2 puffs into the lungs every 6 (six) hours as needed for wheezing or shortness of breath.   unknown  . donepezil (ARICEPT) 10 MG tablet Take 1 tablet by mouth at bedtime.    10/14/2017 at Unknown time  . DULoxetine (CYMBALTA) 30 MG capsule Take 30 mg by mouth daily.   10/15/2017 at Unknown time  . fexofenadine (ALLEGRA) 180 MG tablet Take 180 mg by mouth daily.   10/15/2017  at Unknown time  . fluticasone (FLONASE) 50 MCG/ACT nasal spray Place 2 sprays into both nostrils daily.   10/15/2017 at Unknown time  . fluticasone furoate-vilanterol (BREO ELLIPTA) 100-25 MCG/INH AEPB Inhale 1 puff into the lungs daily.   10/15/2017 at Unknown time  . gabapentin (NEURONTIN) 300 MG capsule Take 300 mg by mouth daily.    10/15/2017 at Unknown time  . guaiFENesin (ROBITUSSIN) 100 MG/5ML SOLN Take 5 mLs by  mouth every 6 (six) hours as needed for cough or to loosen phlegm.   unknown  . HYDROcodone-acetaminophen (NORCO/VICODIN) 5-325 MG tablet Take 1 tablet by mouth 2 (two) times daily.   10/15/2017 at Unknown time  . ketoconazole (NIZORAL) 2 % shampoo Apply 1 application topically every Monday, Wednesday, and Friday. Applied top head topically on MWF at [place] for seborreic dermatitis. Leave on for 15 minutes   10/15/2017 at Unknown time  . levofloxacin (LEVAQUIN) 500 MG tablet Take 500 mg by mouth daily. 7 day course starting on 10/09/2017   10/15/2017 at Unknown time  . predniSONE (DELTASONE) 5 MG tablet Take 5 mg by mouth daily with breakfast.   10/15/2017 at Unknown time  . triamcinolone cream (KENALOG) 0.1 % Apply 1 application topically 2 (two) times daily as needed (for rash/dermatitis).   unknown   Scheduled: . donepezil  10 mg Oral QHS  . DULoxetine  30 mg Oral Daily  . fluticasone  2 spray Each Nare Daily  . fluticasone furoate-vilanterol  1 puff Inhalation Daily  . gabapentin  300 mg Oral Daily  . heparin  5,000 Units Subcutaneous Q8H  . loratadine  10 mg Oral Daily  . methylPREDNISolone (SOLU-MEDROL) injection  40 mg Intravenous Q8H  . sodium chloride flush  3 mL Intravenous Q12H   Continuous: . meropenem (MERREM) IV     OVF:IEPPIRJJOACZY **OR** acetaminophen, albuterol, bisacodyl, guaiFENesin, HYDROcodone-acetaminophen, ondansetron **OR** ondansetron (ZOFRAN) IV, senna-docusate  Assesment: He has a cavitating mass which is almost certainly recurrence of his known non-small cell lung cancer.  When he saw his oncologist last in 2017 he was noted to have an area that appeared to be progression of the lung cancer but it was elected not to treat that considering his advanced age and the fact that he was asymptomatic.  He has COPD at baseline  He has chronic kidney disease which is worse than the last visit so he has an acute kidney injury  He had hyperkalemia that is been treated with  Kayexalate  He has dementia at baseline which is about the same  He has significant hearing difficulty Principal Problem:   Cavitating mass of lower lobe of left lung Active Problems:   H/O: lung cancer   CKD (chronic kidney disease), stage IV (HCC)   Acute kidney injury ([place])   Hyperkalemia   COPD with acute exacerbation ([place])   Mass of right lung   Dementia    Plan: Try to induce sputum.  Continue other treatments.  I will discuss with his oncologist on Monday    LOS: 1 day   Sasuke Yaffe L 10/16/2017, 9:24 AM

## 2017-10-17 NOTE — Progress Notes (Signed)
Subjective: He seems to be feeling a little bit better.  Most of the history is from his son at bedside because the patient has some dementia at baseline and he is very hard of hearing.  The patient does say he feels okay.  He has had some complaint of back pain which is a chronic problem.  Objective: Vital signs in last 24 hours: Temp:  [97.5 F (36.4 C)-98.5 F (36.9 C)] 97.5 F (36.4 C) (01/27 0505) Pulse Rate:  [95-115] 95 (01/27 0505) Resp:  [16] 16 (01/27 0505) BP: (124-160)/(59-81) 158/76 (01/27 0505) SpO2:  [95 %-97 %] 97 % (01/27 0752) Weight change:  Last BM Date: 10/16/17  Intake/Output from previous day: 01/26 0701 - 01/27 0700 In: 820 [P.O.:720; IV Piggyback:100] Out: 1350 [Urine:1350]  PHYSICAL EXAM General appearance: alert, cooperative and no distress Resp: rhonchi bilaterally Cardio: regular rate and rhythm, S1, S2 normal, no murmur, click, rub or gallop GI: soft, non-tender; bowel sounds normal; no masses,  no organomegaly Extremities: extremities normal, atraumatic, no cyanosis or edema Very hard of hearing  Lab Results:  Results for orders placed or performed during the hospital encounter of 10/15/17 (from the past 48 hour(s))  CBC with Differential/Platelet     Status: Abnormal   Collection Time: 10/15/17  4:21 PM  Result Value Ref Range   WBC 15.3 (H) 4.0 - 10.5 K/uL   RBC 4.07 (L) 4.22 - 5.81 MIL/uL   Hemoglobin 11.0 (L) 13.0 - 17.0 g/dL   HCT 36.9 (L) 39.0 - 52.0 %   MCV 90.7 78.0 - 100.0 fL   MCH 27.0 26.0 - 34.0 pg   MCHC 29.8 (L) 30.0 - 36.0 g/dL   RDW 15.3 11.5 - 15.5 %   Platelets 240 150 - 400 K/uL   Neutrophils Relative % 55 %   Neutro Abs 8.4 (H) 1.7 - 7.7 K/uL   Lymphocytes Relative 40 %   Lymphs Abs 6.2 (H) 0.7 - 4.0 K/uL   Monocytes Relative 4 %   Monocytes Absolute 0.6 0.1 - 1.0 K/uL   Eosinophils Relative 1 %   Eosinophils Absolute 0.1 0.0 - 0.7 K/uL   Basophils Relative 0 %   Basophils Absolute 0.0 0.0 - 0.1 K/uL   Comprehensive metabolic panel     Status: Abnormal   Collection Time: 10/15/17  4:21 PM  Result Value Ref Range   Sodium 141 135 - 145 mmol/L   Potassium 5.4 (H) 3.5 - 5.1 mmol/L   Chloride 106 101 - 111 mmol/L   CO2 26 22 - 32 mmol/L   Glucose, Bld 114 (H) 65 - 99 mg/dL   BUN 52 (H) 6 - 20 mg/dL   Creatinine, Ser 2.16 (H) 0.61 - 1.24 mg/dL   Calcium 9.5 8.9 - 10.3 mg/dL   Total Protein 6.3 (L) 6.5 - 8.1 g/dL   Albumin 3.2 (L) 3.5 - 5.0 g/dL   AST 18 15 - 41 U/L   ALT 22 17 - 63 U/L   Alkaline Phosphatase 77 38 - 126 U/L   Total Bilirubin 0.4 0.3 - 1.2 mg/dL   GFR calc non Af Amer 25 (L) >60 mL/min   GFR calc Af Amer 29 (L) >60 mL/min    Comment: (NOTE) The eGFR has been calculated using the CKD EPI equation. This calculation has not been validated in all clinical situations. eGFR's persistently <60 mL/min signify possible Chronic Kidney Disease.    Anion gap 9 5 - 15  Lipase, blood  Status: None   Collection Time: 10/15/17  4:21 PM  Result Value Ref Range   Lipase 32 11 - 51 U/L  Troponin I     Status: None   Collection Time: 10/15/17  4:21 PM  Result Value Ref Range   Troponin I <0.03 <0.03 ng/mL  Brain natriuretic peptide     Status: None   Collection Time: 10/15/17  4:22 PM  Result Value Ref Range   B Natriuretic Peptide 70.0 0.0 - 100.0 pg/mL  POC occult blood, ED     Status: None   Collection Time: 10/15/17  4:47 PM  Result Value Ref Range   Fecal Occult Bld NEGATIVE NEGATIVE  Urinalysis, Routine w reflex microscopic     Status: None   Collection Time: 10/15/17  6:00 PM  Result Value Ref Range   Color, Urine YELLOW YELLOW   APPearance CLEAR CLEAR   Specific Gravity, Urine 1.016 1.005 - 1.030   pH 5.0 5.0 - 8.0   Glucose, UA NEGATIVE NEGATIVE mg/dL   Hgb urine dipstick NEGATIVE NEGATIVE   Bilirubin Urine NEGATIVE NEGATIVE   Ketones, ur NEGATIVE NEGATIVE mg/dL   Protein, ur NEGATIVE NEGATIVE mg/dL   Nitrite NEGATIVE NEGATIVE   Leukocytes, UA NEGATIVE  NEGATIVE  Culture, blood (routine x 2) Call MD if unable to obtain prior to antibiotics being given     Status: None (Preliminary result)   Collection Time: 10/15/17  8:13 PM  Result Value Ref Range   Specimen Description LEFT ANTECUBITAL    Special Requests      BOTTLES DRAWN AEROBIC AND ANAEROBIC Blood Culture adequate volume   Culture NO GROWTH 2 DAYS    Report Status PENDING   Culture, blood (routine x 2) Call MD if unable to obtain prior to antibiotics being given     Status: None (Preliminary result)   Collection Time: 10/15/17  8:38 PM  Result Value Ref Range   Specimen Description BLOOD RIGHT HAND    Special Requests      BOTTLES DRAWN AEROBIC AND ANAEROBIC Blood Culture adequate volume   Culture PENDING    Report Status PENDING   MRSA PCR Screening     Status: None   Collection Time: 10/15/17 11:32 PM  Result Value Ref Range   MRSA by PCR NEGATIVE NEGATIVE    Comment:        The GeneXpert MRSA Assay (FDA approved for NASAL specimens only), is one component of a comprehensive MRSA colonization surveillance program. It is not intended to diagnose MRSA infection nor to guide or monitor treatment for MRSA infections.   Basic metabolic panel     Status: Abnormal   Collection Time: 10/16/17  5:34 AM  Result Value Ref Range   Sodium 141 135 - 145 mmol/L   Potassium 4.1 3.5 - 5.1 mmol/L    Comment: DELTA CHECK NOTED   Chloride 106 101 - 111 mmol/L   CO2 25 22 - 32 mmol/L   Glucose, Bld 165 (H) 65 - 99 mg/dL   BUN 43 (H) 6 - 20 mg/dL   Creatinine, Ser 1.98 (H) 0.61 - 1.24 mg/dL   Calcium 8.6 (L) 8.9 - 10.3 mg/dL   GFR calc non Af Amer 28 (L) >60 mL/min   GFR calc Af Amer 32 (L) >60 mL/min    Comment: (NOTE) The eGFR has been calculated using the CKD EPI equation. This calculation has not been validated in all clinical situations. eGFR's persistently <60 mL/min signify possible Chronic Kidney Disease.  Anion gap 10 5 - 15  CBC WITH DIFFERENTIAL     Status:  Abnormal   Collection Time: 10/16/17  5:34 AM  Result Value Ref Range   WBC 13.2 (H) 4.0 - 10.5 K/uL   RBC 3.74 (L) 4.22 - 5.81 MIL/uL   Hemoglobin 10.2 (L) 13.0 - 17.0 g/dL   HCT 33.7 (L) 39.0 - 52.0 %   MCV 90.1 78.0 - 100.0 fL   MCH 27.3 26.0 - 34.0 pg   MCHC 30.3 30.0 - 36.0 g/dL   RDW 15.1 11.5 - 15.5 %   Platelets 223 150 - 400 K/uL   Neutrophils Relative % 53 %   Lymphocytes Relative 46 %   Monocytes Relative 1 %   Eosinophils Relative 0 %   Basophils Relative 0 %   Neutro Abs 7.0 1.7 - 7.7 K/uL   Lymphs Abs 6.1 (H) 0.7 - 4.0 K/uL   Monocytes Absolute 0.1 0.1 - 1.0 K/uL   Eosinophils Absolute 0.0 0.0 - 0.7 K/uL   Basophils Absolute 0.0 0.0 - 0.1 K/uL   WBC Morphology MILD LEFT SHIFT (1-5% METAS, OCC MYELO, OCC BANDS)     Comment: ABSOLUTE LYMPHOCYTOSIS ATYPICAL LYMPHOCYTES PENDING PATHOLOGIST REVIEW   Strep pneumoniae urinary antigen     Status: None   Collection Time: 10/16/17  6:11 AM  Result Value Ref Range   Strep Pneumo Urinary Antigen NEGATIVE NEGATIVE    Comment:        Infection due to S. pneumoniae cannot be absolutely ruled out since the antigen present may be below the detection limit of the test. Performed at [facility], [address], [place], [place] [address]   Culture, sputum-assessment     Status: None   Collection Time: 10/16/17  3:00 PM  Result Value Ref Range   Specimen Description EXPECTORATED SPUTUM    Special Requests NONE    Sputum evaluation THIS SPECIMEN IS ACCEPTABLE FOR SPUTUM CULTURE    Report Status 10/16/2017 FINAL   Culture, respiratory (NON-Expectorated)     Status: None (Preliminary result)   Collection Time: 10/16/17  3:00 PM  Result Value Ref Range   Specimen Description EXPECTORATED SPUTUM    Special Requests NONE Reflexed from [id number]    Gram Stain      RARE WBC PRESENT, PREDOMINANTLY PMN FEW SQUAMOUS EPITHELIAL CELLS PRESENT FEW GRAM POSITIVE COCCI Performed at [facility], [place] [address],  [place], [place] [address]    Culture PENDING    Report Status PENDING     ABGS No results for input(s): PHART, PO2ART, TCO2, HCO3 in the last 72 hours.  Invalid input(s): PCO2 CULTURES Recent Results (from the past 240 hour(s))  Culture, blood (routine x 2) Call MD if unable to obtain prior to antibiotics being given     Status: None (Preliminary result)   Collection Time: 10/15/17  8:13 PM  Result Value Ref Range Status   Specimen Description LEFT ANTECUBITAL  Final   Special Requests   Final    BOTTLES DRAWN AEROBIC AND ANAEROBIC Blood Culture adequate volume   Culture NO GROWTH 2 DAYS  Final   Report Status PENDING  Incomplete  Culture, blood (routine x 2) Call MD if unable to obtain prior to antibiotics being given     Status: None (Preliminary result)   Collection Time: 10/15/17  8:38 PM  Result Value Ref Range Status   Specimen Description BLOOD RIGHT HAND  Final   Special Requests   Final  BOTTLES DRAWN AEROBIC AND ANAEROBIC Blood Culture adequate volume   Culture PENDING  Incomplete   Report Status PENDING  Incomplete  MRSA PCR Screening     Status: None   Collection Time: 10/15/17 11:32 PM  Result Value Ref Range Status   MRSA by PCR NEGATIVE NEGATIVE Final    Comment:        The GeneXpert MRSA Assay (FDA approved for NASAL specimens only), is one component of a comprehensive MRSA colonization surveillance program. It is not intended to diagnose MRSA infection nor to guide or monitor treatment for MRSA infections.   Culture, sputum-assessment     Status: None   Collection Time: 10/16/17  3:00 PM  Result Value Ref Range Status   Specimen Description EXPECTORATED SPUTUM  Final   Special Requests NONE  Final   Sputum evaluation THIS SPECIMEN IS ACCEPTABLE FOR SPUTUM CULTURE  Final   Report Status 10/16/2017 FINAL  Final  Culture, respiratory (NON-Expectorated)     Status: None (Preliminary result)   Collection Time: 10/16/17  3:00 PM  Result Value Ref Range  Status   Specimen Description EXPECTORATED SPUTUM  Final   Special Requests NONE Reflexed from [id number]  Final   Gram Stain   Final    RARE WBC PRESENT, PREDOMINANTLY PMN FEW SQUAMOUS EPITHELIAL CELLS PRESENT FEW GRAM POSITIVE COCCI Performed at [facility], [address], [place], [place] [address]    Culture PENDING  Incomplete   Report Status PENDING  Incomplete   Studies/Results: Ct Abdomen Pelvis Wo Contrast  Result Date: 10/15/2017 CLINICAL DATA:  Patient with vomiting.  Bilateral shoulder pain. EXAM: CT CHEST, ABDOMEN AND PELVIS WITHOUT CONTRAST TECHNIQUE: Multidetector CT imaging of the chest, abdomen and pelvis was performed following the standard protocol without IV contrast. COMPARISON:  CT abdomen pelvis 06/16/2014; the PET-CT 01/01/2006. FINDINGS: CT CHEST FINDINGS Cardiovascular: Normal heart size. No pericardial effusion. Thoracic aortic vascular calcifications. Mediastinum/Nodes: Enlarged left axillary lymph node measuring 1.4 cm (image 23; series 2). Enlarged right paratracheal lymph node measuring 1.3 cm (image 27; series 2). Additional prominent and mildly enlarged mediastinal lymph nodes are demonstrated particularly within a right paratracheal location. Findings suggestive of left hilar adenopathy although measurement is difficult given lack of IV contrast. The esophagus is normal in appearance. Lungs/Pleura: Dependent mucus within the trachea. Subpleural scarring/atelectasis within the right lower lobe. 8 mm subpleural nodule right lower lobe (image 100; series 3), increased from prior were it measured 5 mm. Centrilobular and paraseptal emphysematous changes. Within the right upper lobe there is a 3.1 x 2.1 cm mass (image 51; series 3). Within the medial left upper lobe there is a 6.2 x 3.7 cm irregular mass with central cavitation (image 51; series 3). This extends inferiorly into the left suprahilar location. Multiple small nodules within the peripheral left upper lobe  (image 63; series 3). New 6 mm left lower lobe nodule (image 94; series 3). Small amount of left apical pleural gas (image 45; series 3). Mucous plugging within the lower lobe bronchi bilaterally. Musculoskeletal: Thoracic spine degenerative changes. Healing fractures involving the posterior left third and fourth ribs. There is associated sclerosis at these locations. There is lytic destruction involving the left aspect of the L2 vertebral body (image 35; series 3), from the adjacent left upper lobe mass. CT ABDOMEN PELVIS FINDINGS Hepatobiliary: Liver is normal in size and contour. Gallbladder is surgically absent. Pancreas: Unremarkable Spleen: Unremarkable Adrenals/Urinary Tract: Adrenal glands are normal. Kidneys are symmetric in size. There is a  4 cm exophytic cystic lesion off the interpolar region of the left kidney. Urinary bladder is unremarkable. Too small to characterize subcentimeter hyperdense lesion interpolar region right kidney (image 68; series 2). Stomach/Bowel: Stool throughout the colon. No abnormal bowel wall thickening or evidence for bowel obstruction. Normal morphology of the stomach. Descending duodenum diverticulum. Vascular/Lymphatic: Normal caliber abdominal aorta. Peripheral calcified atherosclerotic plaque. No retroperitoneal lymphadenopathy. Reproductive: Prostate is enlarged. Central dystrophic calcifications. Other: None. Musculoskeletal: Osseous demineralization. Stable heterogeneity of the sacral ala bilaterally. Lumbar spine degenerative changes. IMPRESSION: 1. Large irregular mass within the paramediastinal left upper lobe with central cavitation. The mass appears to invade the adjacent thoracic vertebral body and ribs. Findings are concerning for pulmonary malignancy. Given the central cavitation, mycobacterial infectious process is not entirely excluded. 2. Right upper lobe mass concerning for additional pulmonary primary or metastatic lesion. 3. Scattered pulmonary nodularity  which may represent metastatic disease. Peripheral left upper lobe nodularity may be infectious/inflammatory in etiology. 4. Left axillary and mediastinal adenopathy concerning for metastatic adenopathy. 5. These results were called by telephone at the time of interpretation on 10/15/2017 at 6:56 pm to Dr. Pattricia Boss , who verbally acknowledged these results. Electronically Signed   By: Lovey Newcomer M.D.   On: 10/15/2017 19:02   Ct Chest Wo Contrast  Result Date: 10/15/2017 CLINICAL DATA:  Patient with vomiting.  Bilateral shoulder pain. EXAM: CT CHEST, ABDOMEN AND PELVIS WITHOUT CONTRAST TECHNIQUE: Multidetector CT imaging of the chest, abdomen and pelvis was performed following the standard protocol without IV contrast. COMPARISON:  CT abdomen pelvis 06/16/2014; the PET-CT 01/01/2006. FINDINGS: CT CHEST FINDINGS Cardiovascular: Normal heart size. No pericardial effusion. Thoracic aortic vascular calcifications. Mediastinum/Nodes: Enlarged left axillary lymph node measuring 1.4 cm (image 23; series 2). Enlarged right paratracheal lymph node measuring 1.3 cm (image 27; series 2). Additional prominent and mildly enlarged mediastinal lymph nodes are demonstrated particularly within a right paratracheal location. Findings suggestive of left hilar adenopathy although measurement is difficult given lack of IV contrast. The esophagus is normal in appearance. Lungs/Pleura: Dependent mucus within the trachea. Subpleural scarring/atelectasis within the right lower lobe. 8 mm subpleural nodule right lower lobe (image 100; series 3), increased from prior were it measured 5 mm. Centrilobular and paraseptal emphysematous changes. Within the right upper lobe there is a 3.1 x 2.1 cm mass (image 51; series 3). Within the medial left upper lobe there is a 6.2 x 3.7 cm irregular mass with central cavitation (image 51; series 3). This extends inferiorly into the left suprahilar location. Multiple small nodules within the  peripheral left upper lobe (image 63; series 3). New 6 mm left lower lobe nodule (image 94; series 3). Small amount of left apical pleural gas (image 45; series 3). Mucous plugging within the lower lobe bronchi bilaterally. Musculoskeletal: Thoracic spine degenerative changes. Healing fractures involving the posterior left third and fourth ribs. There is associated sclerosis at these locations. There is lytic destruction involving the left aspect of the L2 vertebral body (image 35; series 3), from the adjacent left upper lobe mass. CT ABDOMEN PELVIS FINDINGS Hepatobiliary: Liver is normal in size and contour. Gallbladder is surgically absent. Pancreas: Unremarkable Spleen: Unremarkable Adrenals/Urinary Tract: Adrenal glands are normal. Kidneys are symmetric in size. There is a 4 cm exophytic cystic lesion off the interpolar region of the left kidney. Urinary bladder is unremarkable. Too small to characterize subcentimeter hyperdense lesion interpolar region right kidney (image 68; series 2). Stomach/Bowel: Stool throughout the colon. No abnormal bowel wall thickening  or evidence for bowel obstruction. Normal morphology of the stomach. Descending duodenum diverticulum. Vascular/Lymphatic: Normal caliber abdominal aorta. Peripheral calcified atherosclerotic plaque. No retroperitoneal lymphadenopathy. Reproductive: Prostate is enlarged. Central dystrophic calcifications. Other: None. Musculoskeletal: Osseous demineralization. Stable heterogeneity of the sacral ala bilaterally. Lumbar spine degenerative changes. IMPRESSION: 1. Large irregular mass within the paramediastinal left upper lobe with central cavitation. The mass appears to invade the adjacent thoracic vertebral body and ribs. Findings are concerning for pulmonary malignancy. Given the central cavitation, mycobacterial infectious process is not entirely excluded. 2. Right upper lobe mass concerning for additional pulmonary primary or metastatic lesion. 3.  Scattered pulmonary nodularity which may represent metastatic disease. Peripheral left upper lobe nodularity may be infectious/inflammatory in etiology. 4. Left axillary and mediastinal adenopathy concerning for metastatic adenopathy. 5. These results were called by telephone at the time of interpretation on 10/15/2017 at 6:56 pm to Dr. Pattricia Boss , who verbally acknowledged these results. Electronically Signed   By: Lovey Newcomer M.D.   On: 10/15/2017 19:02   Dg Chest Port 1 View  Result Date: 10/15/2017 CLINICAL DATA:  Cough EXAM: PORTABLE CHEST 1 VIEW COMPARISON:  04/04/2015 FINDINGS: Hypoventilation with mild bibasilar atelectasis. No definite pneumonia. Negative for heart failure or effusion. Atherosclerotic aorta IMPRESSION: Mild bibasilar atelectasis. Electronically Signed   By: Franchot Gallo M.D.   On: 10/15/2017 16:43    Medications:  Prior to Admission:  Medications Prior to Admission  Medication Sig Dispense Refill Last Dose  . albuterol (PROVENTIL HFA;VENTOLIN HFA) 108 (90 BASE) MCG/ACT inhaler Inhale 2 puffs into the lungs every 6 (six) hours as needed for wheezing or shortness of breath.   unknown  . donepezil (ARICEPT) 10 MG tablet Take 1 tablet by mouth at bedtime.    10/14/2017 at Unknown time  . DULoxetine (CYMBALTA) 30 MG capsule Take 30 mg by mouth daily.   10/15/2017 at Unknown time  . fexofenadine (ALLEGRA) 180 MG tablet Take 180 mg by mouth daily.   10/15/2017 at Unknown time  . fluticasone (FLONASE) 50 MCG/ACT nasal spray Place 2 sprays into both nostrils daily.   10/15/2017 at Unknown time  . fluticasone furoate-vilanterol (BREO ELLIPTA) 100-25 MCG/INH AEPB Inhale 1 puff into the lungs daily.   10/15/2017 at Unknown time  . gabapentin (NEURONTIN) 300 MG capsule Take 300 mg by mouth daily.    10/15/2017 at Unknown time  . guaiFENesin (ROBITUSSIN) 100 MG/5ML SOLN Take 5 mLs by mouth every 6 (six) hours as needed for cough or to loosen phlegm.   unknown  . HYDROcodone-acetaminophen  (NORCO/VICODIN) 5-325 MG tablet Take 1 tablet by mouth 2 (two) times daily.   10/15/2017 at Unknown time  . ketoconazole (NIZORAL) 2 % shampoo Apply 1 application topically every Monday, Wednesday, and Friday. Applied top head topically on MWF at [place] for seborreic dermatitis. Leave on for 15 minutes   10/15/2017 at Unknown time  . levofloxacin (LEVAQUIN) 500 MG tablet Take 500 mg by mouth daily. 7 day course starting on 10/09/2017   10/15/2017 at Unknown time  . predniSONE (DELTASONE) 5 MG tablet Take 5 mg by mouth daily with breakfast.   10/15/2017 at Unknown time  . triamcinolone cream (KENALOG) 0.1 % Apply 1 application topically 2 (two) times daily as needed (for rash/dermatitis).   unknown   Scheduled: . donepezil  10 mg Oral QHS  . DULoxetine  30 mg Oral Daily  . fluticasone  2 spray Each Nare Daily  . fluticasone furoate-vilanterol  1 puff Inhalation Daily  .  gabapentin  300 mg Oral Daily  . heparin  5,000 Units Subcutaneous Q8H  . loratadine  10 mg Oral Daily  . methylPREDNISolone (SOLU-MEDROL) injection  40 mg Intravenous Q8H  . sodium chloride flush  3 mL Intravenous Q12H   Continuous: . meropenem (MERREM) IV 1 g (10/17/17 4144)   HQI:[id number] **OR** acetaminophen, albuterol, bisacodyl, guaiFENesin, HYDROcodone-acetaminophen, ondansetron **OR** ondansetron (ZOFRAN) IV, senna-docusate  Assesment: He was admitted with a cavitating mass of the lower lobe of the left lung.  He has a history of non-small cell cancer of the lung and approximately 20 months ago there was an area that looked like it was a recurrence but it was felt considering his age and dementia and the fact that he was asymptomatic that treatment was probably not in the patient's best interest.  I think this mass is related to the cancer but I cannot say definitively that it is not tuberculosis although I think that is much less likely.  This is of concern because the patient lives in a facility.  I think he will require  3- AFB smears before we can safely send him back to the facility.  He has COPD exacerbation which is doing better.  He has acute kidney injury which is doing better.  He had elevated potassium and that has resolved  He has chronic low back pain stable  He has dementia which is stable  He is very hard of hearing which is unchanged Principal Problem:   Cavitating mass of lower lobe of left lung Active Problems:   H/O: lung cancer   CKD (chronic kidney disease), stage IV (HCC)   Acute kidney injury (HCC)   Hyperkalemia   COPD with acute exacerbation (HCC)   Mass of right lung   Dementia    Plan: Continue current treatments induce sputum for AFB.  Will discuss with his oncologist by phone tomorrow    LOS: 2 days   Helaine Yackel L 10/17/2017, 9:44 AM

## 2017-10-18 LAB — BASIC METABOLIC PANEL
ANION GAP: 11 (ref 5–15)
BUN: 49 mg/dL — AB (ref 6–20)
CHLORIDE: 104 mmol/L (ref 101–111)
CO2: 26 mmol/L (ref 22–32)
Calcium: 9 mg/dL (ref 8.9–10.3)
Creatinine, Ser: 1.51 mg/dL — ABNORMAL HIGH (ref 0.61–1.24)
GFR calc Af Amer: 44 mL/min — ABNORMAL LOW (ref >60)
GFR, EST NON AFRICAN AMERICAN: 38 mL/min — AB (ref >60)
GLUCOSE: 142 mg/dL — AB (ref 65–99)
POTASSIUM: 4.3 mmol/L (ref 3.5–5.1)
Sodium: 141 mmol/L (ref 135–145)

## 2017-10-18 LAB — CBC
HEMATOCRIT: 36.4 % — AB (ref 39.0–52.0)
HEMOGLOBIN: 11.1 g/dL — AB (ref 13.0–17.0)
MCH: 27.2 pg (ref 26.0–34.0)
MCHC: 30.5 g/dL (ref 30.0–36.0)
MCV: 89.2 fL (ref 78.0–100.0)
Platelets: 255 10*3/uL (ref 150–400)
RBC: 4.08 MIL/uL — ABNORMAL LOW (ref 4.22–5.81)
RDW: 15.4 % (ref 11.5–15.5)
WBC: 22.4 10*3/uL — AB (ref 4.0–10.5)

## 2017-10-18 LAB — PATHOLOGIST SMEAR REVIEW

## 2017-10-18 MED ORDER — SODIUM CHLORIDE 3 % IN NEBU
3.0000 mL | INHALATION_SOLUTION | Freq: Every day | RESPIRATORY_TRACT | Status: AC
Start: 2017-10-18 — End: 2017-10-20
  Administered 2017-10-18 – 2017-10-20 (×3): 3 mL via RESPIRATORY_TRACT
  Filled 2017-10-18 (×4): qty 4

## 2017-10-18 NOTE — Progress Notes (Signed)
Respiratory Care Note: Collected 2nd AFB at 0900 this morning and delivered to the lab. I will pass on to night RT to collect AFB early in the am before the patient eats or drinks.

## 2017-10-18 NOTE — Progress Notes (Signed)
Subjective: He seems a little more confused this morning.  I cannot tell for sure if he got another sputum for AFB done yesterday or not.  I have discussed with respiratory therapy this morning to be sure that we get the sputum because I do not think his nursing home will accept him back without being certain that he does not have tuberculosis.  I think that is very unlikely.  Objective: Vital signs in last 24 hours: Temp:  [97.5 F (36.4 C)-97.8 F (36.6 C)] 97.8 F (36.6 C) (01/28 0609) Pulse Rate:  [96-103] 97 (01/28 0609) Resp:  [16] 16 (01/28 0609) BP: (143-174)/(74-86) 159/77 (01/28 0609) SpO2:  [93 %-99 %] 98 % (01/28 0811) Weight change:  Last BM Date: 10/16/17  Intake/Output from previous day: 01/27 0701 - 01/28 0700 In: 480 [P.O.:480] Out: 700 [Urine:700]  PHYSICAL EXAM General appearance: alert and More confused Resp: rhonchi bilaterally Cardio: regular rate and rhythm, S1, S2 normal, no murmur, click, rub or gallop GI: soft, non-tender; bowel sounds normal; no masses,  no organomegaly Extremities: extremities normal, atraumatic, no cyanosis or edema Skin warm and dry  Lab Results:  Results for orders placed or performed during the hospital encounter of 10/15/17 (from the past 48 hour(s))  Culture, sputum-assessment     Status: None   Collection Time: 10/16/17  3:00 PM  Result Value Ref Range   Specimen Description EXPECTORATED SPUTUM    Special Requests NONE    Sputum evaluation THIS SPECIMEN IS ACCEPTABLE FOR SPUTUM CULTURE    Report Status 10/16/2017 FINAL   Culture, respiratory (NON-Expectorated)     Status: None (Preliminary result)   Collection Time: 10/16/17  3:00 PM  Result Value Ref Range   Specimen Description EXPECTORATED SPUTUM    Special Requests NONE Reflexed from [id number]    Gram Stain      RARE WBC PRESENT, PREDOMINANTLY PMN FEW SQUAMOUS EPITHELIAL CELLS PRESENT FEW GRAM POSITIVE COCCI    Culture      CULTURE REINCUBATED FOR BETTER  GROWTH Performed at [facility], [address] [place], [place] [address]    Report Status PENDING   Basic metabolic panel     Status: Abnormal   Collection Time: 10/18/17  5:09 AM  Result Value Ref Range   Sodium 141 135 - 145 mmol/L   Potassium 4.3 3.5 - 5.1 mmol/L   Chloride 104 101 - 111 mmol/L   CO2 26 22 - 32 mmol/L   Glucose, Bld 142 (H) 65 - 99 mg/dL   BUN 49 (H) 6 - 20 mg/dL   Creatinine, Ser 1.51 (H) 0.61 - 1.24 mg/dL   Calcium 9.0 8.9 - 10.3 mg/dL   GFR calc non Af Amer 38 (L) >60 mL/min   GFR calc Af Amer 44 (L) >60 mL/min    Comment: (NOTE) The eGFR has been calculated using the CKD EPI equation. This calculation has not been validated in all clinical situations. eGFR's persistently <60 mL/min signify possible Chronic Kidney Disease.    Anion gap 11 5 - 15  CBC     Status: Abnormal   Collection Time: 10/18/17  5:09 AM  Result Value Ref Range   WBC 22.4 (H) 4.0 - 10.5 K/uL   RBC 4.08 (L) 4.22 - 5.81 MIL/uL   Hemoglobin 11.1 (L) 13.0 - 17.0 g/dL   HCT 36.4 (L) 39.0 - 52.0 %   MCV 89.2 78.0 - 100.0 fL   MCH 27.2 26.0 - 34.0 pg   MCHC 30.5  30.0 - 36.0 g/dL   RDW 15.4 11.5 - 15.5 %   Platelets 255 150 - 400 K/uL    ABGS No results for input(s): PHART, PO2ART, TCO2, HCO3 in the last 72 hours.  Invalid input(s): PCO2 CULTURES Recent Results (from the past 240 hour(s))  Culture, blood (routine x 2) Call MD if unable to obtain prior to antibiotics being given     Status: None (Preliminary result)   Collection Time: 10/15/17  8:13 PM  Result Value Ref Range Status   Specimen Description LEFT ANTECUBITAL  Final   Special Requests   Final    BOTTLES DRAWN AEROBIC AND ANAEROBIC Blood Culture adequate volume   Culture NO GROWTH 3 DAYS  Final   Report Status PENDING  Incomplete  Culture, blood (routine x 2) Call MD if unable to obtain prior to antibiotics being given     Status: None (Preliminary result)   Collection Time: 10/15/17  8:38 PM  Result Value  Ref Range Status   Specimen Description BLOOD RIGHT HAND  Final   Special Requests   Final    BOTTLES DRAWN AEROBIC AND ANAEROBIC Blood Culture adequate volume   Culture PENDING  Incomplete   Report Status PENDING  Incomplete  MRSA PCR Screening     Status: None   Collection Time: 10/15/17 11:32 PM  Result Value Ref Range Status   MRSA by PCR NEGATIVE NEGATIVE Final    Comment:        The GeneXpert MRSA Assay (FDA approved for NASAL specimens only), is one component of a comprehensive MRSA colonization surveillance program. It is not intended to diagnose MRSA infection nor to guide or monitor treatment for MRSA infections.   Culture, sputum-assessment     Status: None   Collection Time: 10/16/17  3:00 PM  Result Value Ref Range Status   Specimen Description EXPECTORATED SPUTUM  Final   Special Requests NONE  Final   Sputum evaluation THIS SPECIMEN IS ACCEPTABLE FOR SPUTUM CULTURE  Final   Report Status 10/16/2017 FINAL  Final  Culture, respiratory (NON-Expectorated)     Status: None (Preliminary result)   Collection Time: 10/16/17  3:00 PM  Result Value Ref Range Status   Specimen Description EXPECTORATED SPUTUM  Final   Special Requests NONE Reflexed from [id number]  Final   Gram Stain   Final    RARE WBC PRESENT, PREDOMINANTLY PMN FEW SQUAMOUS EPITHELIAL CELLS PRESENT FEW GRAM POSITIVE COCCI    Culture   Final    CULTURE REINCUBATED FOR BETTER GROWTH Performed at [facility], [place] [address], [place], [place] [address]    Report Status PENDING  Incomplete   Studies/Results: No results found.  Medications:  Prior to Admission:  Medications Prior to Admission  Medication Sig Dispense Refill Last Dose  . albuterol (PROVENTIL HFA;VENTOLIN HFA) 108 (90 BASE) MCG/ACT inhaler Inhale 2 puffs into the lungs every 6 (six) hours as needed for wheezing or shortness of breath.   unknown  . donepezil (ARICEPT) 10 MG tablet Take 1 tablet by mouth at bedtime.    10/14/2017 at  Unknown time  . DULoxetine (CYMBALTA) 30 MG capsule Take 30 mg by mouth daily.   10/15/2017 at Unknown time  . fexofenadine (ALLEGRA) 180 MG tablet Take 180 mg by mouth daily.   10/15/2017 at Unknown time  . fluticasone (FLONASE) 50 MCG/ACT nasal spray Place 2 sprays into both nostrils daily.   10/15/2017 at Unknown time  . fluticasone furoate-vilanterol (BREO ELLIPTA) 100-25 MCG/INH  AEPB Inhale 1 puff into the lungs daily.   10/15/2017 at Unknown time  . gabapentin (NEURONTIN) 300 MG capsule Take 300 mg by mouth daily.    10/15/2017 at Unknown time  . guaiFENesin (ROBITUSSIN) 100 MG/5ML SOLN Take 5 mLs by mouth every 6 (six) hours as needed for cough or to loosen phlegm.   unknown  . HYDROcodone-acetaminophen (NORCO/VICODIN) 5-325 MG tablet Take 1 tablet by mouth 2 (two) times daily.   10/15/2017 at Unknown time  . ketoconazole (NIZORAL) 2 % shampoo Apply 1 application topically every Monday, Wednesday, and Friday. Applied top head topically on MWF at [place] for seborreic dermatitis. Leave on for 15 minutes   10/15/2017 at Unknown time  . levofloxacin (LEVAQUIN) 500 MG tablet Take 500 mg by mouth daily. 7 day course starting on 10/09/2017   10/15/2017 at Unknown time  . predniSONE (DELTASONE) 5 MG tablet Take 5 mg by mouth daily with breakfast.   10/15/2017 at Unknown time  . triamcinolone cream (KENALOG) 0.1 % Apply 1 application topically 2 (two) times daily as needed (for rash/dermatitis).   unknown   Scheduled: . donepezil  10 mg Oral QHS  . DULoxetine  30 mg Oral Daily  . fluticasone  2 spray Each Nare Daily  . fluticasone furoate-vilanterol  1 puff Inhalation Daily  . gabapentin  300 mg Oral Daily  . heparin  5,000 Units Subcutaneous Q8H  . loratadine  10 mg Oral Daily  . methylPREDNISolone (SOLU-MEDROL) injection  40 mg Intravenous Q8H  . sodium chloride flush  3 mL Intravenous Q12H   Continuous: . meropenem (MERREM) IV 1 g (10/18/17 0720)   PJR:[id number] **OR** acetaminophen, albuterol,  bisacodyl, guaiFENesin, HYDROcodone-acetaminophen, ondansetron **OR** ondansetron (ZOFRAN) IV, senna-docusate  Assesment: He was admitted with a cavitating mass of the left lower lobe.  This is almost certainly a necrotic cancer.  However I agree that tuberculosis cannot be definitively ruled out on the basis of the x-ray.  He had acute kidney injury which I think is improving  He was hyperkalemic on admission and that has resolved with treatment  He has significant COPD at baseline  He is known to have had a non-small cell lung cancer with what looks like recurrence on his last follow-up with oncology in 2017.  At that time it was elected not to pursue this because he was asymptomatic and because of his advanced age  He has dementia at baseline and seems a little more confused today Principal Problem:   Cavitating mass of lower lobe of left lung Active Problems:   H/O: lung cancer   CKD (chronic kidney disease), stage IV (HCC)   Acute kidney injury ([place])   Hyperkalemia   COPD with acute exacerbation ([place])   Mass of right lung   Dementia    Plan: Continue treatments.  See if we can get sputum's done.  I will call his oncologist today.    LOS: 3 days   Iya Hamed L 10/18/2017, 8:23 AM

## 2017-10-19 LAB — CULTURE, RESPIRATORY: CULTURE: NORMAL

## 2017-10-19 LAB — ACID FAST SMEAR (AFB, MYCOBACTERIA): Acid Fast Smear: NEGATIVE

## 2017-10-19 LAB — CULTURE, RESPIRATORY W GRAM STAIN

## 2017-10-19 NOTE — Progress Notes (Signed)
Subjective: He is overall about the same.  No new complaints.  We are working on getting his AFB smears done.  I discussed his situation with his oncologist yesterday and the oncologist does not feel there is anything else to offer  Objective: Vital signs in last 24 hours: Temp:  [98 F (36.7 C)-98.2 F (36.8 C)] 98.2 F (36.8 C) (01/29 0630) Pulse Rate:  [83-91] 83 (01/29 0630) Resp:  [18] 18 (01/29 0630) BP: (139-145)/(77-86) 141/78 (01/29 0630) SpO2:  [93 %-95 %] 94 % (01/29 0828) Weight change:  Last BM Date: 10/18/17  Intake/Output from previous day: 01/28 0701 - 01/29 0700 In: 840 [P.O.:600; I.V.:40; IV Piggyback:200] Out: 225 [Urine:225]  PHYSICAL EXAM General appearance: alert and cooperative Resp: rhonchi bilaterally Cardio: regular rate and rhythm, S1, S2 normal, no murmur, click, rub or gallop GI: soft, non-tender; bowel sounds normal; no masses,  no organomegaly Extremities: extremities normal, atraumatic, no cyanosis or edema Still confused  Lab Results:  Results for orders placed or performed during the hospital encounter of 10/15/17 (from the past 48 hour(s))  Basic metabolic panel     Status: Abnormal   Collection Time: 10/18/17  5:09 AM  Result Value Ref Range   Sodium 141 135 - 145 mmol/L   Potassium 4.3 3.5 - 5.1 mmol/L   Chloride 104 101 - 111 mmol/L   CO2 26 22 - 32 mmol/L   Glucose, Bld 142 (H) 65 - 99 mg/dL   BUN 49 (H) 6 - 20 mg/dL   Creatinine, Ser 1.51 (H) 0.61 - 1.24 mg/dL   Calcium 9.0 8.9 - 10.3 mg/dL   GFR calc non Af Amer 38 (L) >60 mL/min   GFR calc Af Amer 44 (L) >60 mL/min    Comment: (NOTE) The eGFR has been calculated using the CKD EPI equation. This calculation has not been validated in all clinical situations. eGFR's persistently <60 mL/min signify possible Chronic Kidney Disease.    Anion gap 11 5 - 15  CBC     Status: Abnormal   Collection Time: 10/18/17  5:09 AM  Result Value Ref Range   WBC 22.4 (H) 4.0 - 10.5 K/uL   RBC 4.08 (L) 4.22 - 5.81 MIL/uL   Hemoglobin 11.1 (L) 13.0 - 17.0 g/dL   HCT 36.4 (L) 39.0 - 52.0 %   MCV 89.2 78.0 - 100.0 fL   MCH 27.2 26.0 - 34.0 pg   MCHC 30.5 30.0 - 36.0 g/dL   RDW 15.4 11.5 - 15.5 %   Platelets 255 150 - 400 K/uL    ABGS No results for input(s): PHART, PO2ART, TCO2, HCO3 in the last 72 hours.  Invalid input(s): PCO2 CULTURES Recent Results (from the past 240 hour(s))  Culture, blood (routine x 2) Call MD if unable to obtain prior to antibiotics being given     Status: None (Preliminary result)   Collection Time: 10/15/17  8:13 PM  Result Value Ref Range Status   Specimen Description LEFT ANTECUBITAL  Final   Special Requests   Final    BOTTLES DRAWN AEROBIC AND ANAEROBIC Blood Culture adequate volume   Culture NO GROWTH 3 DAYS  Final   Report Status PENDING  Incomplete  Culture, blood (routine x 2) Call MD if unable to obtain prior to antibiotics being given     Status: None (Preliminary result)   Collection Time: 10/15/17  8:38 PM  Result Value Ref Range Status   Specimen Description BLOOD RIGHT HAND  Final   Special   Requests   Final    BOTTLES DRAWN AEROBIC AND ANAEROBIC Blood Culture adequate volume   Culture PENDING  Incomplete   Report Status PENDING  Incomplete  MRSA PCR Screening     Status: None   Collection Time: 10/15/17 11:32 PM  Result Value Ref Range Status   MRSA by PCR NEGATIVE NEGATIVE Final    Comment:        The GeneXpert MRSA Assay (FDA approved for NASAL specimens only), is one component of a comprehensive MRSA colonization surveillance program. It is not intended to diagnose MRSA infection nor to guide or monitor treatment for MRSA infections.   Culture, sputum-assessment     Status: None   Collection Time: 10/16/17  3:00 PM  Result Value Ref Range Status   Specimen Description EXPECTORATED SPUTUM  Final   Special Requests NONE  Final   Sputum evaluation THIS SPECIMEN IS ACCEPTABLE FOR SPUTUM CULTURE  Final   Report  Status 10/16/2017 FINAL  Final  Culture, respiratory (NON-Expectorated)     Status: None (Preliminary result)   Collection Time: 10/16/17  3:00 PM  Result Value Ref Range Status   Specimen Description EXPECTORATED SPUTUM  Final   Special Requests NONE Reflexed from [id number]  Final   Gram Stain   Final    RARE WBC PRESENT, PREDOMINANTLY PMN FEW SQUAMOUS EPITHELIAL CELLS PRESENT FEW GRAM POSITIVE COCCI    Culture   Final    CULTURE REINCUBATED FOR BETTER GROWTH Performed at [facility], [address] ,  [address]    Report Status PENDING  Incomplete   Studies/Results: No results found.  Medications:  Prior to Admission:  Medications Prior to Admission  Medication Sig Dispense Refill Last Dose  . albuterol (PROVENTIL HFA;VENTOLIN HFA) 108 (90 BASE) MCG/ACT inhaler Inhale 2 puffs into the lungs every 6 (six) hours as needed for wheezing or shortness of breath.   unknown  . donepezil (ARICEPT) 10 MG tablet Take 1 tablet by mouth at bedtime.    10/14/2017 at Unknown time  . DULoxetine (CYMBALTA) 30 MG capsule Take 30 mg by mouth daily.   10/15/2017 at Unknown time  . fexofenadine (ALLEGRA) 180 MG tablet Take 180 mg by mouth daily.   10/15/2017 at Unknown time  . fluticasone (FLONASE) 50 MCG/ACT nasal spray Place 2 sprays into both nostrils daily.   10/15/2017 at Unknown time  . fluticasone furoate-vilanterol (BREO ELLIPTA) 100-25 MCG/INH AEPB Inhale 1 puff into the lungs daily.   10/15/2017 at Unknown time  . gabapentin (NEURONTIN) 300 MG capsule Take 300 mg by mouth daily.    10/15/2017 at Unknown time  . guaiFENesin (ROBITUSSIN) 100 MG/5ML SOLN Take 5 mLs by mouth every 6 (six) hours as needed for cough or to loosen phlegm.   unknown  . HYDROcodone-acetaminophen (NORCO/VICODIN) 5-325 MG tablet Take 1 tablet by mouth 2 (two) times daily.   10/15/2017 at Unknown time  . ketoconazole (NIZORAL) 2 % shampoo Apply 1 application topically every Monday, Wednesday, and Friday.  Applied top head topically on MWF at [address] for seborreic dermatitis. Leave on for 15 minutes   10/15/2017 at Unknown time  . levofloxacin (LEVAQUIN) 500 MG tablet Take 500 mg by mouth daily. 7 day course starting on 10/09/2017   10/15/2017 at Unknown time  . predniSONE (DELTASONE) 5 MG tablet Take 5 mg by mouth daily with breakfast.   10/15/2017 at Unknown time  . triamcinolone cream (KENALOG) 0.1 % Apply 1 application topically 2 (two) times daily   as needed (for rash/dermatitis).   unknown   Scheduled: . donepezil  10 mg Oral QHS  . DULoxetine  30 mg Oral Daily  . fluticasone  2 spray Each Nare Daily  . fluticasone furoate-vilanterol  1 puff Inhalation Daily  . gabapentin  300 mg Oral Daily  . heparin  5,000 Units Subcutaneous Q8H  . loratadine  10 mg Oral Daily  . methylPREDNISolone (SOLU-MEDROL) injection  40 mg Intravenous Q8H  . sodium chloride flush  3 mL Intravenous Q12H  . sodium chloride HYPERTONIC  3 mL Nebulization Daily   Continuous: . meropenem (MERREM) IV Stopped (10/19/17 5038)   UEK:[id number] **OR** acetaminophen, albuterol, bisacodyl, guaiFENesin, HYDROcodone-acetaminophen, ondansetron **OR** ondansetron (ZOFRAN) IV, senna-docusate  Assesment: He was admitted with a cavitated mass of the left lower lobe which I am virtually certain is from his cancer.  However there is still some concern about tuberculosis so we are obtaining AFB smears.  Discussed the situation with his oncologist yesterday and he feels that he does not have anything to offer  He has dementia at baseline which is stable  He has COPD at baseline which is stable Principal Problem:   Cavitating mass of lower lobe of left lung Active Problems:   H/O: lung cancer   CKD (chronic kidney disease), stage IV ([place])   Acute kidney injury ([place])   Hyperkalemia   COPD with acute exacerbation ([place])   Mass of right lung   Dementia    Plan: Continue treatments.  He will need I believe 3- smears because he  lives at an assisted living facility    LOS: 4 days   Makenna Macaluso L 10/19/2017, 9:04 AM

## 2017-10-19 NOTE — Progress Notes (Signed)
Pharmacy Antibiotic Note  Paul Gray is a 84 y.o. male admitted on 10/15/2017 with cavitary lung lesions.  Pharmacy has been consulted for Merrem dosing. Pt admitted with cavitated mass of left lower lobe, which MD is believes is from his cancer, but there is still some concern about TB and AFB smears  obtained. He remains about the same. Oncologist does not have anything else to offer.  D#4 of Merrem.  Plan: Continue Merrem 1gm IV q12h F/U cxs and clinical progress Monitor V/S, labs  Height: 5\' 10"  (177.8 cm) Weight: 148 lb 5.9 oz (67.3 kg) IBW/kg (Calculated) : 73  Temp (24hrs), Avg:98.1 F (36.7 C), Min:98 F (36.7 C), Max:98.2 F (36.8 C)  Recent Labs  Lab 10/15/17 1621 10/16/17 0534 10/18/17 0509  WBC 15.3* 13.2* 22.4*  CREATININE 2.16* 1.98* 1.51*    Estimated Creatinine Clearance: 29.7 mL/min (A) (by C-G formula based on SCr of 1.51 mg/dL (H)).    Allergies  Allergen Reactions  . Penicillins Swelling    Has patient had a PCN reaction causing immediate rash, facial/tongue/throat swelling, SOB or lightheadedness with hypotension: Unknown Has patient had a PCN reaction causing severe rash involving mucus membranes or skin necrosis: Unknown Has patient had a PCN reaction that required hospitalization: Unknown Has patient had a PCN reaction occurring within the last 10 years: Unknown If all of the above answers are "NO", then may proceed with Cephalosporin use.     Antimicrobials this admission: Merrem 1/26 >>    Dose adjustments this admission: N/A  Microbiology results: 1/25 BCx: ngtd 1/25 Sputum: GPC  1/25 MRSA PCR: negative 1/26 AFB smears in process  1/28 AFB smears in process  Thank you for allowing pharmacy to be a part of this patient's care.  Isac Sarna, BS Pharm D, [place] Clinical Pharmacist Pager [phone] 10/19/2017 11:20 AM

## 2017-10-19 NOTE — Progress Notes (Signed)
Attempted AFB sample. Pt completed hypertonic saline nebulizer treatment but was unable to provide a sputum sample. Elmyra Ricks, RN aware

## 2017-10-19 NOTE — Clinical Social Work Note (Signed)
Clinical Social Work Assessment  Patient Details  Name: Paul Gray MRN: [id number] Date of Birth: 17-Aug-1923  Date of referral:  10/19/17               Reason for consult:  Facility Placement                Permission sought to share information with:    Permission granted to share information::     Name::        Agency::  Sharyn Lull at [facility]  Relationship::     Contact Information:     Housing/Transportation Living arrangements for the past 2 months:  [place] of Information:  Facility Patient Interpreter Needed:  None Criminal Activity/Legal Involvement Pertinent to Current Situation/Hospitalization:  No - Comment as needed Significant Relationships:    Lives with:  Facility Resident Do you feel safe going back to the place where you live?  Yes Need for family participation in patient care:  Yes (Comment)  Care giving concerns:  None identified at baseline, facility resident.    Social Worker assessment / plan:  Patient has been a resident at [facility] for the past 3-4 years. He ambulates with a rolling walker. He is independent in dressing, grooming, transfer, hygiene, toileting and self feeding. He requires limited assistance with bathing.  Patient has family support from his two sons who are out of town.  Patient will have to be reassessed before he can return to the facility to ensure that he remains appropriate for ALF placement.   Employment status:    Insurance information:  Medicare PT Recommendations:    Information / Referral to community resources:     Patient/Family's Response to care:  Patient is a long term ALF resident.   Patient/Family's Understanding of and Emotional Response to Diagnosis, Current Treatment, and Prognosis:  LCSW will follow up with patient to address patient and family's understanding and emotional response of patient's diagnosis, treatment and prognosis.  Emotional Assessment Appearance:   Appears stated age Attitude/Demeanor/Rapport:    Affect (typically observed):  Unable to Assess Orientation:  Oriented to Self Alcohol / Substance use:  Not Applicable Psych involvement (Current and /or in the community):  No (Comment)  Discharge Needs  Concerns to be addressed:  No discharge needs identified Readmission within the last 30 days:  No Current discharge risk:  None Barriers to Discharge:  No Barriers Identified   Ihor Gully, LCSW 10/19/2017, 2:43 PM

## 2017-10-20 ENCOUNTER — Encounter (HOSPITAL_COMMUNITY): Payer: Self-pay | Admitting: Primary Care

## 2017-10-20 DIAGNOSIS — Z7189 Other specified counseling: Secondary | ICD-10-CM

## 2017-10-20 DIAGNOSIS — Z515 Encounter for palliative care: Secondary | ICD-10-CM

## 2017-10-20 LAB — CULTURE, BLOOD (ROUTINE X 2)
CULTURE: NO GROWTH
Culture: NO GROWTH
SPECIAL REQUESTS: ADEQUATE
Special Requests: ADEQUATE

## 2017-10-20 MED ORDER — SODIUM CHLORIDE 0.9 % IV SOLN
INTRAVENOUS | Status: DC
  Administered 2017-10-20 – 2017-10-22 (×4): via INTRAVENOUS

## 2017-10-20 MED ORDER — MEGESTROL ACETATE 400 MG/10ML PO SUSP
400.0000 mg | Freq: Every day | ORAL | Status: DC
  Administered 2017-10-20 – 2017-10-22 (×3): 400 mg via ORAL
  Filled 2017-10-20 (×3): qty 10

## 2017-10-20 NOTE — Progress Notes (Signed)
Nebulizer hypertonic treatment not given due to no lab order for AFB and 3 AFB collect samples noted in lab results

## 2017-10-20 NOTE — Progress Notes (Signed)
Subjective: Not much change.  His family is concerned that he is not eating very well.  Third AFB smear being obtained now.  Apparently he had sputum collected yesterday but I cannot tell that it was sent to the lab.  Objective: Vital signs in last 24 hours: Temp:  [97.6 F (36.4 C)-97.8 F (36.6 C)] 97.6 F (36.4 C) (01/30 0630) Pulse Rate:  [75-96] 75 (01/30 0630) Resp:  [14-16] 16 (01/30 0630) BP: (150-153)/(74-87) 150/87 (01/30 0630) SpO2:  [94 %-100 %] 100 % (01/30 0836) Weight change:  Last BM Date: 10/18/17  Intake/Output from previous day: 01/29 0701 - 01/30 0700 In: 620 [P.O.:480; I.V.:40; IV Piggyback:100] Out: 700 [Urine:700]  PHYSICAL EXAM General appearance: alert, mild distress and Confused Resp: rhonchi bilaterally Cardio: regular rate and rhythm, S1, S2 normal, no murmur, click, rub or gallop GI: soft, non-tender; bowel sounds normal; no masses,  no organomegaly Extremities: extremities normal, atraumatic, no cyanosis or edema Skin turgor poor  Lab Results:  Results for orders placed or performed during the hospital encounter of 10/15/17 (from the past 48 hour(s))  Acid Fast Smear (AFB)     Status: None   Collection Time: 10/18/17  9:00 AM  Result Value Ref Range   AFB Specimen Processing Concentration    Acid Fast Smear Negative     Comment: (NOTE) Performed At: [facility] [place], [place] [id number] Rush Farmer MD JM:[id number]    Source (AFB) SPU     ABGS No results for input(s): PHART, PO2ART, TCO2, HCO3 in the last 72 hours.  Invalid input(s): PCO2 CULTURES Recent Results (from the past 240 hour(s))  Culture, blood (routine x 2) Call MD if unable to obtain prior to antibiotics being given     Status: None   Collection Time: 10/15/17  8:13 PM  Result Value Ref Range Status   Specimen Description LEFT ANTECUBITAL  Final   Special Requests   Final    BOTTLES DRAWN AEROBIC AND ANAEROBIC Blood Culture adequate volume    Culture NO GROWTH 5 DAYS  Final   Report Status 10/20/2017 FINAL  Final  Culture, blood (routine x 2) Call MD if unable to obtain prior to antibiotics being given     Status: None   Collection Time: 10/15/17  8:38 PM  Result Value Ref Range Status   Specimen Description BLOOD RIGHT HAND  Final   Special Requests   Final    BOTTLES DRAWN AEROBIC AND ANAEROBIC Blood Culture adequate volume   Culture NO GROWTH 5 DAYS  Final   Report Status 10/20/2017 FINAL  Final  MRSA PCR Screening     Status: None   Collection Time: 10/15/17 11:32 PM  Result Value Ref Range Status   MRSA by PCR NEGATIVE NEGATIVE Final    Comment:        The GeneXpert MRSA Assay (FDA approved for NASAL specimens only), is one component of a comprehensive MRSA colonization surveillance program. It is not intended to diagnose MRSA infection nor to guide or monitor treatment for MRSA infections.   Culture, sputum-assessment     Status: None   Collection Time: 10/16/17  3:00 PM  Result Value Ref Range Status   Specimen Description EXPECTORATED SPUTUM  Final   Special Requests NONE  Final   Sputum evaluation THIS SPECIMEN IS ACCEPTABLE FOR SPUTUM CULTURE  Final   Report Status 10/16/2017 FINAL  Final  Culture, respiratory (NON-Expectorated)     Status: None   Collection Time: 10/16/17  3:00 PM  Result Value Ref Range Status   Specimen Description EXPECTORATED SPUTUM  Final   Special Requests NONE Reflexed from [id number]  Final   Gram Stain   Final    RARE WBC PRESENT, PREDOMINANTLY PMN FEW SQUAMOUS EPITHELIAL CELLS PRESENT FEW GRAM POSITIVE COCCI    Culture   Final    Consistent with normal respiratory flora. Performed at [facility], [place] [address] [place], [place] [address]    Report Status 10/19/2017 FINAL  Final  Acid Fast Smear (AFB)     Status: None   Collection Time: 10/18/17  9:00 AM  Result Value Ref Range Status   AFB Specimen Processing Concentration  Final   Acid Fast Smear Negative   Final    Comment: (NOTE) Performed At: [facility] [place], [place] [id number] Rush Farmer MD UU:[id number]    Source (AFB) SPU  Final   Studies/Results: No results found.  Medications:  Prior to Admission:  Medications Prior to Admission  Medication Sig Dispense Refill Last Dose  . albuterol (PROVENTIL HFA;VENTOLIN HFA) 108 (90 BASE) MCG/ACT inhaler Inhale 2 puffs into the lungs every 6 (six) hours as needed for wheezing or shortness of breath.   unknown  . donepezil (ARICEPT) 10 MG tablet Take 1 tablet by mouth at bedtime.    10/14/2017 at Unknown time  . DULoxetine (CYMBALTA) 30 MG capsule Take 30 mg by mouth daily.   10/15/2017 at Unknown time  . fexofenadine (ALLEGRA) 180 MG tablet Take 180 mg by mouth daily.   10/15/2017 at Unknown time  . fluticasone (FLONASE) 50 MCG/ACT nasal spray Place 2 sprays into both nostrils daily.   10/15/2017 at Unknown time  . fluticasone furoate-vilanterol (BREO ELLIPTA) 100-25 MCG/INH AEPB Inhale 1 puff into the lungs daily.   10/15/2017 at Unknown time  . gabapentin (NEURONTIN) 300 MG capsule Take 300 mg by mouth daily.    10/15/2017 at Unknown time  . guaiFENesin (ROBITUSSIN) 100 MG/5ML SOLN Take 5 mLs by mouth every 6 (six) hours as needed for cough or to loosen phlegm.   unknown  . HYDROcodone-acetaminophen (NORCO/VICODIN) 5-325 MG tablet Take 1 tablet by mouth 2 (two) times daily.   10/15/2017 at Unknown time  . ketoconazole (NIZORAL) 2 % shampoo Apply 1 application topically every Monday, Wednesday, and Friday. Applied top head topically on MWF at [place] for seborreic dermatitis. Leave on for 15 minutes   10/15/2017 at Unknown time  . levofloxacin (LEVAQUIN) 500 MG tablet Take 500 mg by mouth daily. 7 day course starting on 10/09/2017   10/15/2017 at Unknown time  . predniSONE (DELTASONE) 5 MG tablet Take 5 mg by mouth daily with breakfast.   10/15/2017 at Unknown time  . triamcinolone cream (KENALOG) 0.1 % Apply 1 application  topically 2 (two) times daily as needed (for rash/dermatitis).   unknown   Scheduled: . donepezil  10 mg Oral QHS  . DULoxetine  30 mg Oral Daily  . fluticasone  2 spray Each Nare Daily  . fluticasone furoate-vilanterol  1 puff Inhalation Daily  . gabapentin  300 mg Oral Daily  . heparin  5,000 Units Subcutaneous Q8H  . loratadine  10 mg Oral Daily  . megestrol  400 mg Oral Daily  . methylPREDNISolone (SOLU-MEDROL) injection  40 mg Intravenous Q8H  . sodium chloride flush  3 mL Intravenous Q12H   Continuous: . sodium chloride    . meropenem (MERREM) IV Stopped (10/20/17 0703)   KVQ:[id number] **OR** acetaminophen,  albuterol, bisacodyl, guaiFENesin, HYDROcodone-acetaminophen, ondansetron **OR** ondansetron (ZOFRAN) IV, senna-docusate  Assesment: He was admitted with a cavitating mass of the left lower lobe.  This is felt to be from lung cancer.  There is some concern about TB and we are trying to rule that out. he is not eating very well and I think that is likely related to his current medical situation.  He had acute kidney injury which is better.  He has dementia at baseline which also causes some problems with his nutrition Principal Problem:   Cavitating mass of lower lobe of left lung Active Problems:   H/O: lung cancer   CKD (chronic kidney disease), stage IV (HCC)   Acute kidney injury (HCC)   Hyperkalemia   COPD with acute exacerbation (HCC)   Mass of right lung   Dementia    Plan: Once we have 3 AFB smears we can get him set for transfer back to the assisted living facility.    LOS: 5 days   Romulo Okray L 10/20/2017, 8:43 AM

## 2017-10-20 NOTE — Consult Note (Signed)
Consultation Note Date: 10/20/2017   Patient Name: Paul Gray  DOB: [birth year]  MRN: [id number]  Age / Sex: 84 y.o., male  PCP: Sinda Du, MD Referring Physician: Sinda Du, MD  Reason for Consultation: Establishing goals of care, Hospice Evaluation and Psychosocial/spiritual support  HPI/Patient Profile: 84 y.o. male  with past medical history of stage IV kidney disease, COPD, history of lung cancer with recurrence, history of C. difficile Admitted on 10/15/2017 with cavitating mass of the left lower lung felt to be recurrence of lung cancer.   Clinical Assessment and Goals of Care: Paul Gray is resting quietly in bed.  He is calm, cooperative.  He smiles and briefly makes eye contact.  He is very hard of hearing.  Present today at bedside is son [name] and his wife [name].  They live in [place].  We go to the family room for a meeting.  We speak at length about Paul Gray is current functional health status.  We also talked about his health history.  [name] talks at [employer] about her recent loss of her mother in November 2018.  She goes further to share that they have lost her father and Jamie's mother all since 44.  She is quite tearful at times.  [name] goes further to share that they are somewhat in shock, but want to focus on comfort for [name] during his "transition".  We talked about testing for tuberculosis, and needing more time for results.  [name] states that their goal is for Paul Gray to return to [facility] as soon as possible (as soon as AFB smears returned negative) with the benefits of hospice and palliative care of [facility].  We talked about the concept of do not rehospitalize.  We also talked about the concept of let nature take its course.  We talked about how to make decisions for loved ones including 1) keep them at the center of decision making, 2) are we doing something to them or  for them (are we changing what is happening), and 3) what would the person he was 10 years ago say about where he is now.  [name] asks about the need for further imaging for progression and prognosis.  I returned to the concept of are we doing something for him or to him.  I share that what I anticipate is that his body, his demeanor, his emotion, will tell us more about his prognosis then and image.  I encouraged family to lean on hospice for a greater understanding of what end-of-life looks like.  We talked about prognosis with permission.  I share that what is normal and expected, sleeping more, eating and interacting less.  [name] states that she was encouraged by Mr. Bench eating more today, I share that medications can sometimes enhance appetite, but this will likely not last.  I encouraged family that it is normal for them to want to feed Paul Gray, but also normal for him to not want to eat.    We talked about what  hospice will and will not do.  I share that they will not provide IV fluids.  We talked about the body's natural response is to not eat or drink when we are near end of life, this concentrates our endorphins.  We talked about continuing to treat the treatable, but there may be a sudden change that shortens time.  I share that [name] will recommend follow-up treatment if needed after the hospital including by mouth antibiotics if needed.  I share that I do not recommend PICC line or IV antibiotics, also hospice will likely not cover IV antibiotics.  Healthcare power of attorney HCPOA -sons [name] and [name] share power of attorney.  Paperwork copied and placed on chart.   SUMMARY OF RECOMMENDATIONS   Family is requesting return to [facility] with the benefits of hospice, hospice and palliative care of [facility]. We talked about continuing antibiotics by mouth if needed, but NO PICC line, or IV antibiotics. We talked about the concepts of do not rehospitalize. We talked about the  concept of let nature take its course if there is a next infection.  Code Status/Advance Care Planning:  DNR  Symptom Management:   Per PCP, no additional needs at this time.    We discussed the use of morphine for pain and breathlessness, family agreeable.  Palliative Prophylaxis:   Frequent Pain Assessment  Additional Recommendations (Limitations, Scope, Preferences):  Continue to treat the treatable but no further cancer workup or treatment, no CPR, no intubation.  Psycho-social/Spiritual:   Desire for further Chaplaincy support:no  Additional Recommendations: Caregiving  Support/Resources and Education on Hospice  Prognosis:   < 3 months, would not be surprising based on current functional status, severity of illness, desire to not seek further treatment.  Discharge Planning: Return to [facility] with the benefits of Hospice and palliative care of [facility].      Primary Diagnoses: Present on Admission: . COPD with acute exacerbation ([place]) . Acute kidney injury ([place]) . Mass of right lung . Dementia . Hyperkalemia . CKD (chronic kidney disease), stage IV ([place])   I have reviewed the medical record, interviewed the patient and family, and examined the patient. The following aspects are pertinent.  Past Medical History:  Diagnosis Date  . Back pain   . BPH (benign prostatic hyperplasia)   . C. difficile diarrhea   . CKD (chronic kidney disease) stage 4, GFR 15-29 ml/min (HCC)    Creatinine 2.0  . COPD (chronic obstructive pulmonary disease) ([place])   . Hx of adenomatous colonic polyps   . Lung cancer ([place])   . S/P colonoscopy 2002, 2007   Hyperplastic polyps 2002; pancolonic diverticula 2007  . Shortness of breath    Social History   Socioeconomic History  . Marital status: Married    Spouse name: None  . Number of children: None  . Years of education: None  . Highest education level: None  Social Needs  . Financial resource strain: None  . Food  insecurity - worry: None  . Food insecurity - inability: None  . Transportation needs - medical: None  . Transportation needs - non-medical: None  Occupational History  . Occupation: Retired    Comment: cigarette factory  Tobacco Use  . Smoking status: Former Smoker    Packs/day: 1.00    Years: 60.00    Pack years: 60.00    Types: Cigarettes    Last attempt to quit: 09/22/2003    Years since quitting: 14.0  . Smokeless tobacco:  Never Used  . Tobacco comment: quit 6 years ago, about 1 ppd X 60+ years  Substance and Sexual Activity  . Alcohol use: No  . Drug use: No  . Sexual activity: No    Birth control/protection: None  Other Topics Concern  . None  Social History Narrative  . None   Family History  Problem Relation Age of Onset  . Heart failure Father        Died in his 5s  . CAD Brother        Not premature   Scheduled Meds: . donepezil  10 mg Oral QHS  . DULoxetine  30 mg Oral Daily  . fluticasone  2 spray Each Nare Daily  . fluticasone furoate-vilanterol  1 puff Inhalation Daily  . gabapentin  300 mg Oral Daily  . heparin  5,000 Units Subcutaneous Q8H  . loratadine  10 mg Oral Daily  . megestrol  400 mg Oral Daily  . methylPREDNISolone (SOLU-MEDROL) injection  40 mg Intravenous Q8H  . sodium chloride flush  3 mL Intravenous Q12H   Continuous Infusions: . sodium chloride 75 mL/hr at 10/20/17 0915  . meropenem (MERREM) IV Stopped (10/20/17 0703)   PRN Meds:.acetaminophen **OR** acetaminophen, albuterol, bisacodyl, guaiFENesin, HYDROcodone-acetaminophen, ondansetron **OR** ondansetron (ZOFRAN) IV, senna-docusate Medications Prior to Admission:  Prior to Admission medications   Medication Sig Start Date End Date Taking? Authorizing Provider  albuterol (PROVENTIL HFA;VENTOLIN HFA) 108 (90 BASE) MCG/ACT inhaler Inhale 2 puffs into the lungs every 6 (six) hours as needed for wheezing or shortness of breath.   Yes [provider]  donepezil (ARICEPT) 10 MG  tablet Take 1 tablet by mouth at bedtime.  03/07/15  Yes [provider]  DULoxetine (CYMBALTA) 30 MG capsule Take 30 mg by mouth daily. 07/17/14  Yes [provider]  fexofenadine (ALLEGRA) 180 MG tablet Take 180 mg by mouth daily.   Yes [provider]  fluticasone (FLONASE) 50 MCG/ACT nasal spray Place 2 sprays into both nostrils daily.   Yes [provider]  fluticasone furoate-vilanterol (BREO ELLIPTA) 100-25 MCG/INH AEPB Inhale 1 puff into the lungs daily.   Yes [provider]  gabapentin (NEURONTIN) 300 MG capsule Take 300 mg by mouth daily.    Yes [provider]  guaiFENesin (ROBITUSSIN) 100 MG/5ML SOLN Take 5 mLs by mouth every 6 (six) hours as needed for cough or to loosen phlegm.   Yes [provider]  HYDROcodone-acetaminophen (NORCO/VICODIN) 5-325 MG tablet Take 1 tablet by mouth 2 (two) times daily.   Yes [provider]  ketoconazole (NIZORAL) 2 % shampoo Apply 1 application topically every Monday, Wednesday, and Friday. Applied top head topically on MWF at [facility] for seborreic dermatitis. Leave on for 15 minutes   Yes [provider]  levofloxacin (LEVAQUIN) 500 MG tablet Take 500 mg by mouth daily. 7 day course starting on 10/09/2017   Yes [provider]  predniSONE (DELTASONE) 5 MG tablet Take 5 mg by mouth daily with breakfast.   Yes [provider]  triamcinolone cream (KENALOG) 0.1 % Apply 1 application topically 2 (two) times daily as needed (for rash/dermatitis).   Yes [provider]   Allergies  Allergen Reactions  . Penicillins Swelling    Has patient had a PCN reaction causing immediate rash, facial/tongue/throat swelling, SOB or lightheadedness with hypotension: Unknown Has patient had a PCN reaction causing severe rash involving mucus membranes or skin necrosis: Unknown Has patient had a PCN reaction that required  hospitalization: Unknown Has patient had a PCN  reaction occurring within the last 10 years: Unknown If all of the above answers are "NO", then may proceed with Cephalosporin use.    Review of Systems  Unable to perform ROS: Age    Physical Exam  Constitutional: No distress.  Appears frail, makes and briefly keeps eye contact  HENT:  Head: Atraumatic.  Cardiovascular: Normal rate.  Pulmonary/Chest: Effort normal. No respiratory distress.  Abdominal: Soft. He exhibits no distension.  Musculoskeletal: He exhibits no edema.  Neurological: He is alert.  Severe hearing loss  Skin: Skin is warm and dry.  Nursing note and vitals reviewed.   Vital Signs: BP (!) 150/87 (BP Location: Left Arm)   Pulse 75   Temp 97.6 F (36.4 C) (Oral)   Resp 16   Ht 5\' 10"  (1.778 m)   Wt 67.3 kg (148 lb 5.9 oz)   SpO2 100%   BMI 21.29 kg/m  Pain Assessment: PAINAD POSS *See Group Information*: 1-Acceptable,Awake and alert Pain Score: Asleep   SpO2: SpO2: 100 % O2 Device:SpO2: 100 % O2 Flow Rate: .O2 Flow Rate (L/min): 8 L/min(on treatment)  IO: Intake/output summary:   Intake/Output Summary (Last 24 hours) at 10/20/2017 1550 Last data filed at 10/20/2017 1517 Gross per 24 hour  Intake 1247.5 ml  Output 700 ml  Net 547.5 ml    LBM: Last BM Date: 10/18/17 Baseline Weight: Weight: 79.4 kg (175 lb) Most recent weight: Weight: 67.3 kg (148 lb 5.9 oz)     Palliative Assessment/Data:   Flowsheet Rows     Most Recent Value  Intake Tab  Referral Department  Hospitalist  Unit at Time of Referral  Med/Surg Unit  Palliative Care Primary Diagnosis  Cancer  Date Notified  10/20/17  Palliative Care Type  New Palliative care  Reason for referral  Clarify Goals of Care, Psychosocial or Spiritual support  Date of Admission  10/15/17  Date first seen by Palliative Care  10/20/17  # of days Palliative referral response time  0 Day(s)  # of days IP prior to Palliative referral  5  Clinical Assessment  Palliative Performance Scale Score  30%    Pain Max last 24 hours  Not able to report  Pain Min Last 24 hours  Not able to report  Dyspnea Max Last 24 Hours  Not able to report  Dyspnea Min Last 24 hours  Not able to report  Psychosocial & Spiritual Assessment  Palliative Care Outcomes  Patient/Family meeting held?  Yes  Who was at the meeting?  patient at bedside, son [name] and his wife in family room.   Palliative Care Outcomes  Clarified goals of care, Provided end of life care assistance, Counseled regarding hospice, Provided advance care planning, Provided psychosocial or spiritual support  Patient/Family wishes: Interventions discontinued/not started   Mechanical Ventilation      Time In: 1405 Time Out: 1525 Time Total: 80 minutes Greater than 50%  of this time was spent counseling and coordinating care related to the above assessment and plan.  Signed by: Drue Novel, NP   Please contact Palliative Medicine Team phone at [phone] for questions and concerns.  For individual provider: See Shea Evans

## 2017-10-21 LAB — ACID FAST SMEAR (AFB): ACID FAST SMEAR - AFSCU2: NEGATIVE

## 2017-10-21 LAB — ACID FAST SMEAR (AFB, MYCOBACTERIA): Acid Fast Smear: NEGATIVE

## 2017-10-21 NOTE — Progress Notes (Signed)
Subjective: He seems a little better.  Palliative care consult noted and appreciated and I agree that is the best course.  Discussed with daughter-in-law at bedside and she is in agreement.  He seems to be eating a little better.  Objective: Vital signs in last 24 hours: Temp:  [97.4 F (36.3 C)-98 F (36.7 C)] 98 F (36.7 C) (01/31 0501) Pulse Rate:  [81-85] 85 (01/31 0501) Resp:  [16] 16 (01/31 0501) BP: (140-141)/(67-79) 140/79 (01/31 0501) SpO2:  [94 %-99 %] 99 % (01/31 0830) Weight change:  Last BM Date: 10/18/17  Intake/Output from previous day: 01/30 0701 - 01/31 0700 In: 1701.3 [P.O.:120; I.V.:1481.3; IV Piggyback:100] Out: 850 [Urine:850]  PHYSICAL EXAM General appearance: alert, cooperative and no distress Resp: rhonchi bilaterally Cardio: regular rate and rhythm, S1, S2 normal, no murmur, click, rub or gallop GI: soft, non-tender; bowel sounds normal; no masses,  no organomegaly Extremities: extremities normal, atraumatic, no cyanosis or edema Skin warm and dry  Lab Results:  No results found for this or any previous visit (from the past 48 hour(s)).  ABGS No results for input(s): PHART, PO2ART, TCO2, HCO3 in the last 72 hours.  Invalid input(s): PCO2 CULTURES Recent Results (from the past 240 hour(s))  Culture, blood (routine x 2) Call MD if unable to obtain prior to antibiotics being given     Status: None   Collection Time: 10/15/17  8:13 PM  Result Value Ref Range Status   Specimen Description LEFT ANTECUBITAL  Final   Special Requests   Final    BOTTLES DRAWN AEROBIC AND ANAEROBIC Blood Culture adequate volume   Culture NO GROWTH 5 DAYS  Final   Report Status 10/20/2017 FINAL  Final  Culture, blood (routine x 2) Call MD if unable to obtain prior to antibiotics being given     Status: None   Collection Time: 10/15/17  8:38 PM  Result Value Ref Range Status   Specimen Description BLOOD RIGHT HAND  Final   Special Requests   Final    BOTTLES DRAWN  AEROBIC AND ANAEROBIC Blood Culture adequate volume   Culture NO GROWTH 5 DAYS  Final   Report Status 10/20/2017 FINAL  Final  MRSA PCR Screening     Status: None   Collection Time: 10/15/17 11:32 PM  Result Value Ref Range Status   MRSA by PCR NEGATIVE NEGATIVE Final    Comment:        The GeneXpert MRSA Assay (FDA approved for NASAL specimens only), is one component of a comprehensive MRSA colonization surveillance program. It is not intended to diagnose MRSA infection nor to guide or monitor treatment for MRSA infections.   Acid Fast Smear (AFB)     Status: None   Collection Time: 10/16/17  3:00 PM  Result Value Ref Range Status   AFB Specimen Processing Concentration  Final   Acid Fast Smear Negative  Final    Comment: (NOTE) Performed At: [facility] [address] [place], [place] [id number] Rush Farmer MD [id number]    Source (AFB) EXPECTORATED SPUTUM  Final  Culture, sputum-assessment     Status: None   Collection Time: 10/16/17  3:00 PM  Result Value Ref Range Status   Specimen Description EXPECTORATED SPUTUM  Final   Special Requests NONE  Final   Sputum evaluation THIS SPECIMEN IS ACCEPTABLE FOR SPUTUM CULTURE  Final   Report Status 10/16/2017 FINAL  Final  Culture, respiratory (NON-Expectorated)     Status: None   Collection Time: 10/16/17  3:00 PM  Result Value Ref Range Status   Specimen Description EXPECTORATED SPUTUM  Final   Special Requests NONE Reflexed from [id number]  Final   Gram Stain   Final    RARE WBC PRESENT, PREDOMINANTLY PMN FEW SQUAMOUS EPITHELIAL CELLS PRESENT FEW GRAM POSITIVE COCCI    Culture   Final    Consistent with normal respiratory flora. Performed at [facility], [place] [address], [place], [place] [address]    Report Status 10/19/2017 FINAL  Final  Acid Fast Smear (AFB)     Status: None   Collection Time: 10/18/17  9:00 AM  Result Value Ref Range Status   AFB Specimen Processing Concentration  Final   Acid  Fast Smear Negative  Final    Comment: (NOTE) Performed At: [facility] [place], [place] [id number] Rush Farmer MD [id number]    Source (AFB) SPU  Final   Studies/Results: No results found.  Medications:  Prior to Admission:  Medications Prior to Admission  Medication Sig Dispense Refill Last Dose  . albuterol (PROVENTIL HFA;VENTOLIN HFA) 108 (90 BASE) MCG/ACT inhaler Inhale 2 puffs into the lungs every 6 (six) hours as needed for wheezing or shortness of breath.   unknown  . donepezil (ARICEPT) 10 MG tablet Take 1 tablet by mouth at bedtime.    10/14/2017 at Unknown time  . DULoxetine (CYMBALTA) 30 MG capsule Take 30 mg by mouth daily.   10/15/2017 at Unknown time  . fexofenadine (ALLEGRA) 180 MG tablet Take 180 mg by mouth daily.   10/15/2017 at Unknown time  . fluticasone (FLONASE) 50 MCG/ACT nasal spray Place 2 sprays into both nostrils daily.   10/15/2017 at Unknown time  . fluticasone furoate-vilanterol (BREO ELLIPTA) 100-25 MCG/INH AEPB Inhale 1 puff into the lungs daily.   10/15/2017 at Unknown time  . gabapentin (NEURONTIN) 300 MG capsule Take 300 mg by mouth daily.    10/15/2017 at Unknown time  . guaiFENesin (ROBITUSSIN) 100 MG/5ML SOLN Take 5 mLs by mouth every 6 (six) hours as needed for cough or to loosen phlegm.   unknown  . HYDROcodone-acetaminophen (NORCO/VICODIN) 5-325 MG tablet Take 1 tablet by mouth 2 (two) times daily.   10/15/2017 at Unknown time  . ketoconazole (NIZORAL) 2 % shampoo Apply 1 application topically every Monday, Wednesday, and Friday. Applied top head topically on MWF at [facility] for seborreic dermatitis. Leave on for 15 minutes   10/15/2017 at Unknown time  . levofloxacin (LEVAQUIN) 500 MG tablet Take 500 mg by mouth daily. 7 day course starting on 10/09/2017   10/15/2017 at Unknown time  . predniSONE (DELTASONE) 5 MG tablet Take 5 mg by mouth daily with breakfast.   10/15/2017 at Unknown time  . triamcinolone cream (KENALOG) 0.1 %  Apply 1 application topically 2 (two) times daily as needed (for rash/dermatitis).   unknown   Scheduled: . donepezil  10 mg Oral QHS  . DULoxetine  30 mg Oral Daily  . fluticasone  2 spray Each Nare Daily  . fluticasone furoate-vilanterol  1 puff Inhalation Daily  . gabapentin  300 mg Oral Daily  . heparin  5,000 Units Subcutaneous Q8H  . loratadine  10 mg Oral Daily  . megestrol  400 mg Oral Daily  . methylPREDNISolone (SOLU-MEDROL) injection  40 mg Intravenous Q8H  . sodium chloride flush  3 mL Intravenous Q12H   Continuous: . sodium chloride 75 mL/hr at 10/21/17 0400  . meropenem (MERREM) IV Stopped (10/21/17 0600)  QMG:[id number] **OR** acetaminophen, albuterol, bisacodyl, guaiFENesin, HYDROcodone-acetaminophen, ondansetron **OR** ondansetron (ZOFRAN) IV, senna-docusate  Assesment: He was admitted with a cavitating mass of the left lower lobe which I think is related to his known cancer.  He is not felt to be a candidate for any treatment for that.  There is some concern that this could represent TB and he has 2- AFB smears so far.  He will need a third 1 before he can be discharged. Principal Problem:   Cavitating mass of lower lobe of left lung Active Problems:   H/O: lung cancer   CKD (chronic kidney disease), stage IV (HCC)   Acute kidney injury (HCC)   Hyperkalemia   COPD with acute exacerbation (HCC)   Mass of right lung   Dementia   Palliative care encounter   Goals of care, counseling/discussion   Encounter for hospice care discussion   DNR (do not resuscitate) discussion    Plan: Await third AFB smear.  Discharge back to his assisted living facility with hospice care when that is reported    LOS: 6 days   Keyen Marban L 10/21/2017, 9:05 AM

## 2017-10-21 NOTE — Care Management Note (Addendum)
Case Management Note  Patient Details  Name: Paul Gray MRN: [id number] Date of Birth: Sep 19, 1923        Admitted with lower lung mass. Pt is from [place]. He has seen palliative medicine here in hospital. Plan is for pt to return to ALF with hospice services. Facility and family preference is to use [facility]. CM has contacted them and spoken with Amy. They will require [facility] to make the referral once pt is back on site. CM has spoken with Rip Harbour, RN at [facility] who will make sure the referral is made after pt returns.              Expected Discharge Date:      10/23/17            Expected Discharge Plan:  Assisted Living / Rest Home  In-House Referral:  Clinical Social Work  Discharge planning Services  CM Consult  Status of Service:  Completed, signed off  If discussed at [employer] of Stay Meetings, dates discussed:  10/21/17  Additional Comments:  Sherald Barge, RN 10/21/2017, 11:34 AM

## 2017-10-21 NOTE — Progress Notes (Signed)
I have communicated with the telesitter and with the charge nurse, and I feel that it is okay for the son to assist the patient to use the urinal at the bedside. The patient's son understands that if the patient is requesting to go to the bathroom that he must ask for either mine or the NT's help.

## 2017-10-21 NOTE — Care Management Note (Deleted)
Case Management Note  Patient Details  Name: Paul Gray MRN: [id number] Date of Birth: 08-Aug-1923  If discussed at Long Length of Stay Meetings, dates discussed:  10/21/2017  Additional Comments:  Sherald Barge, RN 10/21/2017, 3:15 PM

## 2017-10-21 NOTE — Progress Notes (Signed)
Called and spoke with receptionist at [facility] regarding patient's impending DC. Receptionist informed me that the nurse that usually comes out and assesses patients before they can accept them has gone home for the day. I asked the receptionist to leave a message for her stating that the patient's final AFB smear has come back negative, which is the result the ALF had been waiting for before accepting the patient back. I will pass this information on to the oncoming RN.

## 2017-10-22 MED ORDER — MEGESTROL ACETATE 400 MG/10ML PO SUSP: 400.0000 mg | Freq: Every day | ORAL | 0 refills | Status: AC

## 2017-10-22 NOTE — Progress Notes (Signed)
Subjective: No new complaints.  Discussed with his sitter who spent the night last night and she said he had a good night.  Objective: Vital signs in last 24 hours: Temp:  [97.5 F (36.4 C)-98.1 F (36.7 C)] 97.7 F (36.5 C) (02/01 0703) Pulse Rate:  [75-88] 75 (02/01 0703) Resp:  [18] 18 (02/01 0703) BP: (132-152)/(66-83) 152/83 (02/01 0703) SpO2:  [95 %-100 %] 95 % (02/01 0703) Weight change:  Last BM Date: 10/18/17  Intake/Output from previous day: 01/31 0701 - 02/01 0700 In: 2555 [P.O.:480; I.V.:1875; IV Piggyback:200] Out: 600 [Urine:600]  PHYSICAL EXAM General appearance: alert, cooperative and Mildly confused Resp: rhonchi bilaterally Cardio: regular rate and rhythm, S1, S2 normal, no murmur, click, rub or gallop GI: soft, non-tender; bowel sounds normal; no masses,  no organomegaly Extremities: extremities normal, atraumatic, no cyanosis or edema Very hard of hearing  Lab Results:  Results for orders placed or performed during the hospital encounter of 10/15/17 (from the past 48 hour(s))  Acid Fast Smear (AFB)     Status: None   Collection Time: 10/20/17 11:07 AM  Result Value Ref Range   AFB Specimen Processing Concentration    Acid Fast Smear Negative     Comment: (NOTE) Performed At: [facility] [place], [place] [id number] Rush Farmer MD [id number]    Source (AFB) EXPECTORATED SPUTUM     ABGS No results for input(s): PHART, PO2ART, TCO2, HCO3 in the last 72 hours.  Invalid input(s): PCO2 CULTURES Recent Results (from the past 240 hour(s))  Culture, blood (routine x 2) Call MD if unable to obtain prior to antibiotics being given     Status: None   Collection Time: 10/15/17  8:13 PM  Result Value Ref Range Status   Specimen Description LEFT ANTECUBITAL  Final   Special Requests   Final    BOTTLES DRAWN AEROBIC AND ANAEROBIC Blood Culture adequate volume   Culture NO GROWTH 5 DAYS  Final   Report Status 10/20/2017 FINAL   Final  Culture, blood (routine x 2) Call MD if unable to obtain prior to antibiotics being given     Status: None   Collection Time: 10/15/17  8:38 PM  Result Value Ref Range Status   Specimen Description BLOOD RIGHT HAND  Final   Special Requests   Final    BOTTLES DRAWN AEROBIC AND ANAEROBIC Blood Culture adequate volume   Culture NO GROWTH 5 DAYS  Final   Report Status 10/20/2017 FINAL  Final  MRSA PCR Screening     Status: None   Collection Time: 10/15/17 11:32 PM  Result Value Ref Range Status   MRSA by PCR NEGATIVE NEGATIVE Final    Comment:        The GeneXpert MRSA Assay (FDA approved for NASAL specimens only), is one component of a comprehensive MRSA colonization surveillance program. It is not intended to diagnose MRSA infection nor to guide or monitor treatment for MRSA infections.   Acid Fast Smear (AFB)     Status: None   Collection Time: 10/16/17  3:00 PM  Result Value Ref Range Status   AFB Specimen Processing Concentration  Final   Acid Fast Smear Negative  Final    Comment: (NOTE) Performed At: [facility] [place], [place] [id number] Rush Farmer MD [id number]    Source (AFB) EXPECTORATED SPUTUM  Final  Culture, sputum-assessment     Status: None   Collection Time: 10/16/17  3:00 PM  Result Value Ref Range  Status   Specimen Description EXPECTORATED SPUTUM  Final   Special Requests NONE  Final   Sputum evaluation THIS SPECIMEN IS ACCEPTABLE FOR SPUTUM CULTURE  Final   Report Status 10/16/2017 FINAL  Final  Culture, respiratory (NON-Expectorated)     Status: None   Collection Time: 10/16/17  3:00 PM  Result Value Ref Range Status   Specimen Description EXPECTORATED SPUTUM  Final   Special Requests NONE Reflexed from [id number]  Final   Gram Stain   Final    RARE WBC PRESENT, PREDOMINANTLY PMN FEW SQUAMOUS EPITHELIAL CELLS PRESENT FEW GRAM POSITIVE COCCI    Culture   Final    Consistent with normal respiratory  flora. Performed at [facility], [place] [address] [place], [place] [address]    Report Status 10/19/2017 FINAL  Final  Acid Fast Smear (AFB)     Status: None   Collection Time: 10/18/17  9:00 AM  Result Value Ref Range Status   AFB Specimen Processing Concentration  Final   Acid Fast Smear Negative  Final    Comment: (NOTE) Performed At: [facility] [place], [place] [id number] Rush Farmer MD [id number]    Source (AFB) SPU  Final  Acid Fast Smear (AFB)     Status: None   Collection Time: 10/20/17 11:07 AM  Result Value Ref Range Status   AFB Specimen Processing Concentration  Final   Acid Fast Smear Negative  Final    Comment: (NOTE) Performed At: [facility] [place], [place] [id number] Rush Farmer MD [id number]    Source (AFB) EXPECTORATED SPUTUM  Final   Studies/Results: No results found.  Medications:  Prior to Admission:  Medications Prior to Admission  Medication Sig Dispense Refill Last Dose  . albuterol (PROVENTIL HFA;VENTOLIN HFA) 108 (90 BASE) MCG/ACT inhaler Inhale 2 puffs into the lungs every 6 (six) hours as needed for wheezing or shortness of breath.   unknown  . donepezil (ARICEPT) 10 MG tablet Take 1 tablet by mouth at bedtime.    10/14/2017 at Unknown time  . DULoxetine (CYMBALTA) 30 MG capsule Take 30 mg by mouth daily.   10/15/2017 at Unknown time  . fexofenadine (ALLEGRA) 180 MG tablet Take 180 mg by mouth daily.   10/15/2017 at Unknown time  . fluticasone (FLONASE) 50 MCG/ACT nasal spray Place 2 sprays into both nostrils daily.   10/15/2017 at Unknown time  . fluticasone furoate-vilanterol (BREO ELLIPTA) 100-25 MCG/INH AEPB Inhale 1 puff into the lungs daily.   10/15/2017 at Unknown time  . gabapentin (NEURONTIN) 300 MG capsule Take 300 mg by mouth daily.    10/15/2017 at Unknown time  . guaiFENesin (ROBITUSSIN) 100 MG/5ML SOLN Take 5 mLs by mouth every 6 (six) hours as needed for cough or to  loosen phlegm.   unknown  . HYDROcodone-acetaminophen (NORCO/VICODIN) 5-325 MG tablet Take 1 tablet by mouth 2 (two) times daily.   10/15/2017 at Unknown time  . ketoconazole (NIZORAL) 2 % shampoo Apply 1 application topically every Monday, Wednesday, and Friday. Applied top head topically on MWF at [facility] for seborreic dermatitis. Leave on for 15 minutes   10/15/2017 at Unknown time  . levofloxacin (LEVAQUIN) 500 MG tablet Take 500 mg by mouth daily. 7 day course starting on 10/09/2017   10/15/2017 at Unknown time  . predniSONE (DELTASONE) 5 MG tablet Take 5 mg by mouth daily with breakfast.   10/15/2017 at Unknown time  . triamcinolone cream (KENALOG) 0.1 % Apply 1  application topically 2 (two) times daily as needed (for rash/dermatitis).   unknown   Scheduled: . donepezil  10 mg Oral QHS  . DULoxetine  30 mg Oral Daily  . fluticasone  2 spray Each Nare Daily  . fluticasone furoate-vilanterol  1 puff Inhalation Daily  . gabapentin  300 mg Oral Daily  . heparin  5,000 Units Subcutaneous Q8H  . loratadine  10 mg Oral Daily  . megestrol  400 mg Oral Daily  . methylPREDNISolone (SOLU-MEDROL) injection  40 mg Intravenous Q8H  . sodium chloride flush  3 mL Intravenous Q12H   Continuous: . sodium chloride 75 mL/hr at 10/22/17 0529  . meropenem [facility]) IV Stopped (10/22/17 7106)   YIR:[id number] **OR** acetaminophen, albuterol, bisacodyl, guaiFENesin, HYDROcodone-acetaminophen, ondansetron **OR** ondansetron (ZOFRAN) IV, senna-docusate  Assesment: He was admitted with a cavitating mass of the lung.  He is known to have had previous cancer of the lung and at his last visit with his oncologist it looks like there may be some residual or recurrent disease but this was not treated because he was asymptomatic and because of his advanced age and dementia.  The lesions on chest x-ray look like cancer.  Because of the cavity there was concern that he might have TB but he now has 3- AFB smears.  There was  more concerned about that because he does live in an assisted living facility but he is negative for TB. Principal Problem:   Cavitating mass of lower lobe of left lung Active Problems:   H/O: lung cancer   CKD (chronic kidney disease), stage IV (HCC)   Acute kidney injury (HCC)   Hyperkalemia   COPD with acute exacerbation (HCC)   Mass of right lung   Dementia   Palliative care encounter   Goals of care, counseling/discussion   Encounter for hospice care discussion   DNR (do not resuscitate) discussion    Plan: Okay to transfer back to assisted living facility today    LOS: 7 days   Paul Gray L 10/22/2017, 8:47 AM

## 2017-10-22 NOTE — Care Management Important Message (Signed)
Important Message  Patient Details  Name: Paul Gray MRN: [id number] Date of Birth: 1923/01/24   Medicare Important Message Given:  Yes    Codey Burling, Chauncey Reading, RN 10/22/2017, 9:14 AM

## 2017-10-22 NOTE — Discharge Summary (Signed)
Physician Discharge Summary  Patient ID: Paul Gray MRN: [id number] DOB/AGE: September 08, 1923 84 y.o. Primary Care Physician:Jermarcus Mcfadyen, Percell Miller, MD Admit date: 10/15/2017 Discharge date: 10/22/2017    Discharge Diagnoses:   Principal Problem:   Cavitating mass of lower lobe of left lung Active Problems:   H/O: lung cancer   CKD (chronic kidney disease), stage IV (HCC)   Acute kidney injury ([place])   Hyperkalemia   COPD with acute exacerbation (HCC)   Mass of right lung   Dementia   Palliative care encounter   Goals of care, counseling/discussion   Encounter for hospice care discussion   DNR (do not resuscitate) discussion   Allergies as of 10/22/2017      Reactions   Penicillins Swelling   Has patient had a PCN reaction causing immediate rash, facial/tongue/throat swelling, SOB or lightheadedness with hypotension: Unknown Has patient had a PCN reaction causing severe rash involving mucus membranes or skin necrosis: Unknown Has patient had a PCN reaction that required hospitalization: Unknown Has patient had a PCN reaction occurring within the last 10 years: Unknown If all of the above answers are "NO", then may proceed with Cephalosporin use.      Medication List    STOP taking these medications   levofloxacin 500 MG tablet Commonly known as:  LEVAQUIN     TAKE these medications   albuterol 108 (90 Base) MCG/ACT inhaler Commonly known as:  PROVENTIL HFA;VENTOLIN HFA Inhale 2 puffs into the lungs every 6 (six) hours as needed for wheezing or shortness of breath.   BREO ELLIPTA 100-25 MCG/INH Aepb Generic drug:  fluticasone furoate-vilanterol Inhale 1 puff into the lungs daily.   donepezil 10 MG tablet Commonly known as:  ARICEPT Take 1 tablet by mouth at bedtime.   DULoxetine 30 MG capsule Commonly known as:  CYMBALTA Take 30 mg by mouth daily.   fexofenadine 180 MG tablet Commonly known as:  ALLEGRA Take 180 mg by mouth daily.   fluticasone 50 MCG/ACT nasal  spray Commonly known as:  FLONASE Place 2 sprays into both nostrils daily.   gabapentin 300 MG capsule Commonly known as:  NEURONTIN Take 300 mg by mouth daily.   guaiFENesin 100 MG/5ML Soln Commonly known as:  ROBITUSSIN Take 5 mLs by mouth every 6 (six) hours as needed for cough or to loosen phlegm.   HYDROcodone-acetaminophen 5-325 MG tablet Commonly known as:  NORCO/VICODIN Take 1 tablet by mouth 2 (two) times daily.   ketoconazole 2 % shampoo Commonly known as:  NIZORAL Apply 1 application topically every Monday, Wednesday, and Friday. Applied top head topically on MWF at [place] for seborreic dermatitis. Leave on for 15 minutes   megestrol 400 MG/10ML suspension Commonly known as:  MEGACE Take 10 mLs (400 mg total) by mouth daily.   predniSONE 5 MG tablet Commonly known as:  DELTASONE Take 5 mg by mouth daily with breakfast.   triamcinolone cream 0.1 % Commonly known as:  KENALOG Apply 1 application topically 2 (two) times daily as needed (for rash/dermatitis).       Discharged Condition: Unchanged    Consults: Telephone consultation with Dr. Janean Sark his oncologist at [facility]/palliative care  Significant Diagnostic Studies: Ct Abdomen Pelvis Wo Contrast  Result Date: 10/15/2017 CLINICAL DATA:  Patient with vomiting.  Bilateral shoulder pain. EXAM: CT CHEST, ABDOMEN AND PELVIS WITHOUT CONTRAST TECHNIQUE: Multidetector CT imaging of the chest, abdomen and pelvis was performed following the standard protocol without IV contrast. COMPARISON:  CT abdomen  pelvis 06/16/2014; the PET-CT 01/01/2006. FINDINGS: CT CHEST FINDINGS Cardiovascular: Normal heart size. No pericardial effusion. Thoracic aortic vascular calcifications. Mediastinum/Nodes: Enlarged left axillary lymph node measuring 1.4 cm (image 23; series 2). Enlarged right paratracheal lymph node measuring 1.3 cm (image 27; series 2). Additional prominent and mildly enlarged mediastinal  lymph nodes are demonstrated particularly within a right paratracheal location. Findings suggestive of left hilar adenopathy although measurement is difficult given lack of IV contrast. The esophagus is normal in appearance. Lungs/Pleura: Dependent mucus within the trachea. Subpleural scarring/atelectasis within the right lower lobe. 8 mm subpleural nodule right lower lobe (image 100; series 3), increased from prior were it measured 5 mm. Centrilobular and paraseptal emphysematous changes. Within the right upper lobe there is a 3.1 x 2.1 cm mass (image 51; series 3). Within the medial left upper lobe there is a 6.2 x 3.7 cm irregular mass with central cavitation (image 51; series 3). This extends inferiorly into the left suprahilar location. Multiple small nodules within the peripheral left upper lobe (image 63; series 3). New 6 mm left lower lobe nodule (image 94; series 3). Small amount of left apical pleural gas (image 45; series 3). Mucous plugging within the lower lobe bronchi bilaterally. Musculoskeletal: Thoracic spine degenerative changes. Healing fractures involving the posterior left third and fourth ribs. There is associated sclerosis at these locations. There is lytic destruction involving the left aspect of the L2 vertebral body (image 35; series 3), from the adjacent left upper lobe mass. CT ABDOMEN PELVIS FINDINGS Hepatobiliary: Liver is normal in size and contour. Gallbladder is surgically absent. Pancreas: Unremarkable Spleen: Unremarkable Adrenals/Urinary Tract: Adrenal glands are normal. Kidneys are symmetric in size. There is a 4 cm exophytic cystic lesion off the interpolar region of the left kidney. Urinary bladder is unremarkable. Too small to characterize subcentimeter hyperdense lesion interpolar region right kidney (image 68; series 2). Stomach/Bowel: Stool throughout the colon. No abnormal bowel wall thickening or evidence for bowel obstruction. Normal morphology of the stomach. Descending  duodenum diverticulum. Vascular/Lymphatic: Normal caliber abdominal aorta. Peripheral calcified atherosclerotic plaque. No retroperitoneal lymphadenopathy. Reproductive: Prostate is enlarged. Central dystrophic calcifications. Other: None. Musculoskeletal: Osseous demineralization. Stable heterogeneity of the sacral ala bilaterally. Lumbar spine degenerative changes. IMPRESSION: 1. Large irregular mass within the paramediastinal left upper lobe with central cavitation. The mass appears to invade the adjacent thoracic vertebral body and ribs. Findings are concerning for pulmonary malignancy. Given the central cavitation, mycobacterial infectious process is not entirely excluded. 2. Right upper lobe mass concerning for additional pulmonary primary or metastatic lesion. 3. Scattered pulmonary nodularity which may represent metastatic disease. Peripheral left upper lobe nodularity may be infectious/inflammatory in etiology. 4. Left axillary and mediastinal adenopathy concerning for metastatic adenopathy. 5. These results were called by telephone at the time of interpretation on 10/15/2017 at 6:56 pm to Dr. Pattricia Boss , who verbally acknowledged these results. Electronically Signed   By: Lovey Newcomer M.D.   On: 10/15/2017 19:02   Ct Chest Wo Contrast  Result Date: 10/15/2017 CLINICAL DATA:  Patient with vomiting.  Bilateral shoulder pain. EXAM: CT CHEST, ABDOMEN AND PELVIS WITHOUT CONTRAST TECHNIQUE: Multidetector CT imaging of the chest, abdomen and pelvis was performed following the standard protocol without IV contrast. COMPARISON:  CT abdomen pelvis 06/16/2014; the PET-CT 01/01/2006. FINDINGS: CT CHEST FINDINGS Cardiovascular: Normal heart size. No pericardial effusion. Thoracic aortic vascular calcifications. Mediastinum/Nodes: Enlarged left axillary lymph node measuring 1.4 cm (image 23; series 2). Enlarged right paratracheal lymph node measuring 1.3 cm (image  27; series 2). Additional prominent and mildly  enlarged mediastinal lymph nodes are demonstrated particularly within a right paratracheal location. Findings suggestive of left hilar adenopathy although measurement is difficult given lack of IV contrast. The esophagus is normal in appearance. Lungs/Pleura: Dependent mucus within the trachea. Subpleural scarring/atelectasis within the right lower lobe. 8 mm subpleural nodule right lower lobe (image 100; series 3), increased from prior were it measured 5 mm. Centrilobular and paraseptal emphysematous changes. Within the right upper lobe there is a 3.1 x 2.1 cm mass (image 51; series 3). Within the medial left upper lobe there is a 6.2 x 3.7 cm irregular mass with central cavitation (image 51; series 3). This extends inferiorly into the left suprahilar location. Multiple small nodules within the peripheral left upper lobe (image 63; series 3). New 6 mm left lower lobe nodule (image 94; series 3). Small amount of left apical pleural gas (image 45; series 3). Mucous plugging within the lower lobe bronchi bilaterally. Musculoskeletal: Thoracic spine degenerative changes. Healing fractures involving the posterior left third and fourth ribs. There is associated sclerosis at these locations. There is lytic destruction involving the left aspect of the L2 vertebral body (image 35; series 3), from the adjacent left upper lobe mass. CT ABDOMEN PELVIS FINDINGS Hepatobiliary: Liver is normal in size and contour. Gallbladder is surgically absent. Pancreas: Unremarkable Spleen: Unremarkable Adrenals/Urinary Tract: Adrenal glands are normal. Kidneys are symmetric in size. There is a 4 cm exophytic cystic lesion off the interpolar region of the left kidney. Urinary bladder is unremarkable. Too small to characterize subcentimeter hyperdense lesion interpolar region right kidney (image 68; series 2). Stomach/Bowel: Stool throughout the colon. No abnormal bowel wall thickening or evidence for bowel obstruction. Normal morphology of  the stomach. Descending duodenum diverticulum. Vascular/Lymphatic: Normal caliber abdominal aorta. Peripheral calcified atherosclerotic plaque. No retroperitoneal lymphadenopathy. Reproductive: Prostate is enlarged. Central dystrophic calcifications. Other: None. Musculoskeletal: Osseous demineralization. Stable heterogeneity of the sacral ala bilaterally. Lumbar spine degenerative changes. IMPRESSION: 1. Large irregular mass within the paramediastinal left upper lobe with central cavitation. The mass appears to invade the adjacent thoracic vertebral body and ribs. Findings are concerning for pulmonary malignancy. Given the central cavitation, mycobacterial infectious process is not entirely excluded. 2. Right upper lobe mass concerning for additional pulmonary primary or metastatic lesion. 3. Scattered pulmonary nodularity which may represent metastatic disease. Peripheral left upper lobe nodularity may be infectious/inflammatory in etiology. 4. Left axillary and mediastinal adenopathy concerning for metastatic adenopathy. 5. These results were called by telephone at the time of interpretation on 10/15/2017 at 6:56 pm to Dr. Pattricia Boss , who verbally acknowledged these results. Electronically Signed   By: Lovey Newcomer M.D.   On: 10/15/2017 19:02   Dg Chest Port 1 View  Result Date: 10/15/2017 CLINICAL DATA:  Cough EXAM: PORTABLE CHEST 1 VIEW COMPARISON:  04/04/2015 FINDINGS: Hypoventilation with mild bibasilar atelectasis. No definite pneumonia. Negative for heart failure or effusion. Atherosclerotic aorta IMPRESSION: Mild bibasilar atelectasis. Electronically Signed   By: Franchot Gallo M.D.   On: 10/15/2017 16:43    Lab Results: Basic Metabolic Panel: No results for input(s): NA, K, CL, CO2, GLUCOSE, BUN, CREATININE, CALCIUM, MG, PHOS in the last 72 hours. Liver Function Tests: No results for input(s): AST, ALT, ALKPHOS, BILITOT, PROT, ALBUMIN in the last 72 hours.   CBC: No results for input(s):  WBC, NEUTROABS, HGB, HCT, MCV, PLT in the last 72 hours.  Recent Results (from the past 240 hour(s))  Culture, blood (routine x  2) Call MD if unable to obtain prior to antibiotics being given     Status: None   Collection Time: 10/15/17  8:13 PM  Result Value Ref Range Status   Specimen Description LEFT ANTECUBITAL  Final   Special Requests   Final    BOTTLES DRAWN AEROBIC AND ANAEROBIC Blood Culture adequate volume   Culture NO GROWTH 5 DAYS  Final   Report Status 10/20/2017 FINAL  Final  Culture, blood (routine x 2) Call MD if unable to obtain prior to antibiotics being given     Status: None   Collection Time: 10/15/17  8:38 PM  Result Value Ref Range Status   Specimen Description BLOOD RIGHT HAND  Final   Special Requests   Final    BOTTLES DRAWN AEROBIC AND ANAEROBIC Blood Culture adequate volume   Culture NO GROWTH 5 DAYS  Final   Report Status 10/20/2017 FINAL  Final  MRSA PCR Screening     Status: None   Collection Time: 10/15/17 11:32 PM  Result Value Ref Range Status   MRSA by PCR NEGATIVE NEGATIVE Final    Comment:        The GeneXpert MRSA Assay (FDA approved for NASAL specimens only), is one component of a comprehensive MRSA colonization surveillance program. It is not intended to diagnose MRSA infection nor to guide or monitor treatment for MRSA infections.   Acid Fast Smear (AFB)     Status: None   Collection Time: 10/16/17  3:00 PM  Result Value Ref Range Status   AFB Specimen Processing Concentration  Final   Acid Fast Smear Negative  Final    Comment: (NOTE) Performed At: [facility] [address] [place], [place] [id number] Rush Farmer MD [id number]    Source (AFB) EXPECTORATED SPUTUM  Final  Culture, sputum-assessment     Status: None   Collection Time: 10/16/17  3:00 PM  Result Value Ref Range Status   Specimen Description EXPECTORATED SPUTUM  Final   Special Requests NONE  Final   Sputum evaluation THIS SPECIMEN IS ACCEPTABLE FOR  SPUTUM CULTURE  Final   Report Status 10/16/2017 FINAL  Final  Culture, respiratory (NON-Expectorated)     Status: None   Collection Time: 10/16/17  3:00 PM  Result Value Ref Range Status   Specimen Description EXPECTORATED SPUTUM  Final   Special Requests NONE Reflexed from [id number]  Final   Gram Stain   Final    RARE WBC PRESENT, PREDOMINANTLY PMN FEW SQUAMOUS EPITHELIAL CELLS PRESENT FEW GRAM POSITIVE COCCI    Culture   Final    Consistent with normal respiratory flora. Performed at [facility], [place] [address], [place], [place] [address]    Report Status 10/19/2017 FINAL  Final  Acid Fast Smear (AFB)     Status: None   Collection Time: 10/18/17  9:00 AM  Result Value Ref Range Status   AFB Specimen Processing Concentration  Final   Acid Fast Smear Negative  Final    Comment: (NOTE) Performed At: [facility] [place], [place] [id number] Rush Farmer MD [id number]    Source (AFB) SPU  Final  Acid Fast Smear (AFB)     Status: None   Collection Time: 10/20/17 11:07 AM  Result Value Ref Range Status   AFB Specimen Processing Concentration  Final   Acid Fast Smear Negative  Final    Comment: (NOTE) Performed At: [facility] [address] [place], [place] [id number] Rush Farmer MD [id number]  Source (AFB) EXPECTORATED SPUTUM  Final     Hospital Course: This is a 84 year old who came to the emergency department because of cough and congestion.  He lives at an assisted living facility.  He is known to have had a previous history of non-small cell lung cancer.  Chest x-ray was not normal so he had CT which showed a large mass in his left lung with cavitary areas as well as a number of other areas it appeared to be lung cancer scattered throughout both lungs.  Last note from his oncologist says that he had what looked like some recurrent disease but that he was not going to be treated because he was asymptomatic and because of his  advanced age and dementia.  He was admitted started on meropenem for potential infection.  He was isolated because of the possibility that he had TB and he has had 3- AFB smears now.  I had telephone consultation with Dr. Mindi Junker who is his oncologist at [facility] and Dr. Mindi Junker felt that there was no treatment available.  All of this has been discussed with family and I obtained palliative medicine consultation and they want hospice care at his assisted living facility.  Which I think is appropriate.  He has been ruled out for TB he is on his seventh day of IV antibiotics and I think he can be discharged today.  Discharge Exam: Blood pressure (!) 152/83, pulse 75, temperature 97.7 F (36.5 C), temperature source Oral, resp. rate 18, height 5\' 10"  (1.778 m), weight 67.3 kg (148 lb 5.9 oz), SpO2 95 %. He is awake and alert.  He is hard of hearing.  Disposition: Back to assisted living facility with hospice care.  He has a contraindication to taking his duloxetine because of his renal function but considering his situation I am going to leave him on that  Discharge Instructions    Diet - low sodium heart healthy   Complete by:  As directed    Increase activity slowly   Complete by:  As directed         Signed: Marolyn Urschel L   10/22/2017, 8:54 AM

## 2017-10-22 NOTE — Clinical Social Work Note (Signed)
Facility assessed patient and accepts him. DIL at bedside and will transport back to facility.   Discharge clinicals sent.    LCSW signing off.    Terrian Sentell, Clydene Pugh, LCSW

## 2017-10-22 NOTE — Progress Notes (Signed)
IV removed, patient tolerated well, 2x2 gauze and paper tape applied to site.  AVS reviewed with patient's daughter-in-law. DNR given to given to daughter-in-law to take to facility.  Patient discharged to [facility] in [place].  Patient transported to facility by daughter-in-law.

## 2017-10-22 NOTE — NC FL2 (Signed)
[place] LEVEL OF CARE SCREENING TOOL     IDENTIFICATION  Patient Name: Paul Gray Birthdate: 12-02-1922 Sex: male Admission Date (Current Location): 10/15/2017  [facility] and [place] Number:  [employer] and Address:  [place] [address] [place]      Provider Number: [id number]  Attending Physician Name and Address:  Sinda Du, MD  Relative Name and Phone Number:       Current Level of Care: Hospital Recommended Level of Care: [place] Prior Approval Number:    Date Approved/Denied:   PASRR Number:    Discharge Plan: Other (Comment)([facility])    Current Diagnoses: Patient Active Problem List   Diagnosis Date Noted  . Palliative care encounter   . Goals of care, counseling/discussion   . Encounter for hospice care discussion   . DNR (do not resuscitate) discussion   . 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 Mass of right lung 10/15/2017  . Dementia 10/15/2017  . Lung mass   . ARF (acute renal failure) ([place]) 04/28/2015  . Hyperkalemia 04/28/2015  . COPD with acute exacerbation ([place]) 04/28/2015  . BPH (benign prostatic hyperplasia) 04/07/2015  . Dehydration 04/07/2015  . Enteritis due to Clostridium difficile 04/06/2015  . Hyperglycemia 04/05/2015  . Diarrhea   . Acute kidney injury ([place]) 04/04/2015  . BPH (benign prostatic hypertrophy) with urinary retention 07/19/2014  . Bladder stones 07/19/2014  . Inguinal hernia without mention of obstruction or gangrene, unilateral or unspecified, (not specified as recurrent) 06/08/2014  . Balance problem 12/12/2013  . Abnormality of gait 12/12/2013  . Precordial pain 08/12/2012  . H/O: lung cancer 10/14/2011  . CKD (chronic kidney disease), stage IV ([place]) 10/14/2011    Orientation RESPIRATION BLADDER Height & Weight     Self  Normal Continent Weight: 148 lb 5.9 oz (67.3 kg) Height:  5\' 10"  (177.8 cm)   BEHAVIORAL SYMPTOMS/MOOD NEUROLOGICAL BOWEL NUTRITION STATUS      Continent Diet(low sodium heart healthy)  AMBULATORY STATUS COMMUNICATION OF NEEDS Skin   Supervision(uses rolling walker) Verbally Normal                       Personal Care Assistance Level of Assistance  Bathing, Feeding, Dressing Bathing Assistance: Limited assistance Feeding assistance: Independent Dressing Assistance: Limited assistance     Functional Limitations Info  Sight, Hearing, Speech Sight Info: Adequate Hearing Info: Adequate Speech Info: Adequate    SPECIAL CARE FACTORS FREQUENCY                       Contractures Contractures Info: Not present    Additional Factors Info  Code Status, Allergies, Psychotropic Code Status Info: DNR Allergies Info: Penicillins Psychotropic Info: Cymbalta         Current Medications (10/22/2017):  This is the current hospital active medication list Current Facility-Administered Medications  Medication Dose Route Frequency Provider Last Rate Last Dose  . 0.9 %  sodium chloride infusion   Intravenous Continuous Sinda Du, MD 75 mL/hr at 10/22/17 0529    . acetaminophen (TYLENOL) tablet 650 mg  650 mg Oral Q6H PRN Opyd, Ilene Qua, MD       Or  . acetaminophen (TYLENOL) suppository 650 mg  650 mg Rectal Q6H PRN Opyd, Ilene Qua, MD      . albuterol (PROVENTIL) (2.5 MG/3ML) 0.083% nebulizer solution 2.5 mg  2.5 mg Nebulization Q4H  PRN Opyd, Ilene Qua, MD      . bisacodyl (DULCOLAX) EC tablet 5 mg  5 mg Oral Daily PRN Opyd, Ilene Qua, MD      . donepezil (ARICEPT) tablet 10 mg  10 mg Oral QHS Opyd, Ilene Qua, MD   10 mg at 10/21/17 2258  . DULoxetine (CYMBALTA) DR capsule 30 mg  30 mg Oral Daily Opyd, Ilene Qua, MD   30 mg at 10/21/17 0929  . fluticasone (FLONASE) 50 MCG/ACT nasal spray 2 spray  2 spray Each Nare Daily Opyd, Ilene Qua, MD   2 spray at 10/21/17 0930  . fluticasone furoate-vilanterol (BREO ELLIPTA) 100-25 MCG/INH 1 puff  1 puff  Inhalation Daily Opyd, Ilene Qua, MD   1 puff at 10/21/17 0830  . gabapentin (NEURONTIN) capsule 300 mg  300 mg Oral Daily Opyd, Ilene Qua, MD   300 mg at 10/21/17 0929  . guaiFENesin (ROBITUSSIN) 100 MG/5ML solution 100 mg  5 mL Oral Q6H PRN Opyd, Ilene Qua, MD   100 mg at 10/21/17 0929  . heparin injection 5,000 Units  5,000 Units Subcutaneous Q8H Opyd, Ilene Qua, MD   5,000 Units at 10/22/17 0530  . HYDROcodone-acetaminophen (NORCO/VICODIN) 5-325 MG per tablet 1-2 tablet  1-2 tablet Oral Q6H PRN Opyd, Ilene Qua, MD   2 tablet at 10/20/17 1051  . loratadine (CLARITIN) tablet 10 mg  10 mg Oral Daily Opyd, Ilene Qua, MD   10 mg at 10/21/17 0929  . megestrol (MEGACE) 400 MG/10ML suspension 400 mg  400 mg Oral Daily Sinda Du, MD   400 mg at 10/21/17 0929  . meropenem (MERREM) 1 g in sodium chloride 0.9 % 100 mL IVPB  1 g Intravenous Q12H Sinda Du, MD   Stopped at 10/22/17 [phone]  . methylPREDNISolone sodium succinate (SOLU-MEDROL) 40 mg/mL injection 40 mg  40 mg Intravenous Q8H Opyd, Ilene Qua, MD   40 mg at 10/22/17 0203  . ondansetron (ZOFRAN) tablet 4 mg  4 mg Oral Q6H PRN Opyd, Ilene Qua, MD       Or  . ondansetron (ZOFRAN) injection 4 mg  4 mg Intravenous Q6H PRN Opyd, Ilene Qua, MD      . senna-docusate (Senokot-S) tablet 1 tablet  1 tablet Oral QHS PRN Opyd, Ilene Qua, MD      . sodium chloride flush (NS) 0.9 % injection 3 mL  3 mL Intravenous Q12H Opyd, Ilene Qua, MD   3 mL at 10/21/17 0930     Discharge Medications: Medication List     STOP taking these medications   levofloxacin 500 MG tablet Commonly known as:  LEVAQUIN     TAKE these medications   albuterol 108 (90 Base) MCG/ACT inhaler Commonly known as:  PROVENTIL HFA;VENTOLIN HFA Inhale 2 puffs into the lungs every 6 (six) hours as needed for wheezing or shortness of breath.   BREO ELLIPTA 100-25 MCG/INH Aepb Generic drug:  fluticasone furoate-vilanterol Inhale 1 puff into the lungs daily.   donepezil  10 MG tablet Commonly known as:  ARICEPT Take 1 tablet by mouth at bedtime.   DULoxetine 30 MG capsule Commonly known as:  CYMBALTA Take 30 mg by mouth daily.   fexofenadine 180 MG tablet Commonly known as:  ALLEGRA Take 180 mg by mouth daily.   fluticasone 50 MCG/ACT nasal spray Commonly known as:  FLONASE Place 2 sprays into both nostrils daily.   gabapentin 300 MG capsule Commonly known as:  NEURONTIN Take 300 mg by mouth daily.  guaiFENesin 100 MG/5ML Soln Commonly known as:  ROBITUSSIN Take 5 mLs by mouth every 6 (six) hours as needed for cough or to loosen phlegm.   HYDROcodone-acetaminophen 5-325 MG tablet Commonly known as:  NORCO/VICODIN Take 1 tablet by mouth 2 (two) times daily.   ketoconazole 2 % shampoo Commonly known as:  NIZORAL Apply 1 application topically every Monday, Wednesday, and Friday. Applied top head topically on MWF at [place] for seborreic dermatitis. Leave on for 15 minutes   megestrol 400 MG/10ML suspension Commonly known as:  MEGACE Take 10 mLs (400 mg total) by mouth daily.   predniSONE 5 MG tablet Commonly known as:  DELTASONE Take 5 mg by mouth daily with breakfast.   triamcinolone cream 0.1 % Commonly known as:  KENALOG Apply 1 application topically 2 (two) times daily as needed (for rash/dermatitis).          Relevant Imaging Results:  Relevant Lab Results:   Additional Information    Flonnie Wierman, Clydene Pugh, LCSW

## 2017-10-22 NOTE — Clinical Social Work Note (Signed)
Facility will assess patient for appropriateness today at 11:15.    Daneen Volcy, Clydene Pugh, LCSW

## 2017-10-29 LAB — LEGIONELLA PNEUMOPHILA SEROGP 1 UR AG: L. pneumophila Serogp 1 Ur Ag: NEGATIVE

## 2017-11-19 DEATH — deceased

## 2017-12-01 LAB — ACID FAST CULTURE WITH REFLEXED SENSITIVITIES (MYCOBACTERIA): Acid Fast Culture: NEGATIVE

## 2017-12-02 LAB — ACID FAST CULTURE WITH REFLEXED SENSITIVITIES: ACID FAST CULTURE - AFSCU3: NEGATIVE

## 2017-12-04 LAB — ACID FAST CULTURE WITH REFLEXED SENSITIVITIES: ACID FAST CULTURE - AFSCU3: NEGATIVE
